# Patient Record
Sex: Female | Born: 1940
Health system: Southern US, Community
[De-identification: ages and names within clinical notes are randomized; demographics above are authoritative.]

## PROBLEM LIST (undated history)

## (undated) DIAGNOSIS — E7800 Pure hypercholesterolemia, unspecified: Secondary | ICD-10-CM

## (undated) DIAGNOSIS — G629 Polyneuropathy, unspecified: Secondary | ICD-10-CM

## (undated) DIAGNOSIS — Z923 Personal history of irradiation: Secondary | ICD-10-CM

## (undated) DIAGNOSIS — C73 Malignant neoplasm of thyroid gland: Secondary | ICD-10-CM

## (undated) DIAGNOSIS — C50919 Malignant neoplasm of unspecified site of unspecified female breast: Secondary | ICD-10-CM

## (undated) DIAGNOSIS — I10 Essential (primary) hypertension: Secondary | ICD-10-CM

## (undated) DIAGNOSIS — C3490 Malignant neoplasm of unspecified part of unspecified bronchus or lung: Secondary | ICD-10-CM

## (undated) DIAGNOSIS — E039 Hypothyroidism, unspecified: Secondary | ICD-10-CM

## (undated) DIAGNOSIS — E079 Disorder of thyroid, unspecified: Secondary | ICD-10-CM

## (undated) DIAGNOSIS — Z9221 Personal history of antineoplastic chemotherapy: Secondary | ICD-10-CM

## (undated) DIAGNOSIS — E119 Type 2 diabetes mellitus without complications: Secondary | ICD-10-CM

## (undated) HISTORY — DX: Disorder of thyroid, unspecified: E07.9

## (undated) HISTORY — DX: Essential (primary) hypertension: I10

## (undated) HISTORY — PX: BREAST EXCISIONAL BIOPSY: SUR124

## (undated) HISTORY — PX: THYROIDECTOMY: SHX17

## (undated) HISTORY — DX: Pure hypercholesterolemia, unspecified: E78.00

## (undated) HISTORY — DX: Type 2 diabetes mellitus without complications: E11.9

---

## 1999-07-24 DIAGNOSIS — C50919 Malignant neoplasm of unspecified site of unspecified female breast: Secondary | ICD-10-CM

## 1999-07-24 HISTORY — DX: Malignant neoplasm of unspecified site of unspecified female breast: C50.919

## 1999-07-24 HISTORY — PX: BREAST LUMPECTOMY: SHX2

## 2011-08-15 DIAGNOSIS — K3189 Other diseases of stomach and duodenum: Secondary | ICD-10-CM | POA: Diagnosis not present

## 2011-08-15 DIAGNOSIS — R1013 Epigastric pain: Secondary | ICD-10-CM | POA: Diagnosis not present

## 2011-09-13 DIAGNOSIS — I10 Essential (primary) hypertension: Secondary | ICD-10-CM | POA: Diagnosis not present

## 2011-09-13 DIAGNOSIS — R42 Dizziness and giddiness: Secondary | ICD-10-CM | POA: Diagnosis not present

## 2011-10-02 DIAGNOSIS — N63 Unspecified lump in unspecified breast: Secondary | ICD-10-CM | POA: Diagnosis not present

## 2011-10-02 DIAGNOSIS — Z853 Personal history of malignant neoplasm of breast: Secondary | ICD-10-CM | POA: Diagnosis not present

## 2011-10-02 DIAGNOSIS — Z124 Encounter for screening for malignant neoplasm of cervix: Secondary | ICD-10-CM | POA: Diagnosis not present

## 2011-10-02 DIAGNOSIS — Z0000 Encounter for general adult medical examination without abnormal findings: Secondary | ICD-10-CM | POA: Diagnosis not present

## 2011-10-08 DIAGNOSIS — Z1211 Encounter for screening for malignant neoplasm of colon: Secondary | ICD-10-CM | POA: Diagnosis not present

## 2011-10-16 DIAGNOSIS — Z13820 Encounter for screening for osteoporosis: Secondary | ICD-10-CM | POA: Diagnosis not present

## 2011-10-16 DIAGNOSIS — Z853 Personal history of malignant neoplasm of breast: Secondary | ICD-10-CM | POA: Diagnosis not present

## 2011-10-16 DIAGNOSIS — N63 Unspecified lump in unspecified breast: Secondary | ICD-10-CM | POA: Diagnosis not present

## 2011-10-30 DIAGNOSIS — I10 Essential (primary) hypertension: Secondary | ICD-10-CM | POA: Diagnosis not present

## 2011-10-30 DIAGNOSIS — D4860 Neoplasm of uncertain behavior of unspecified breast: Secondary | ICD-10-CM | POA: Diagnosis not present

## 2011-10-30 DIAGNOSIS — Z01810 Encounter for preprocedural cardiovascular examination: Secondary | ICD-10-CM | POA: Diagnosis not present

## 2011-10-30 DIAGNOSIS — Z01818 Encounter for other preprocedural examination: Secondary | ICD-10-CM | POA: Diagnosis not present

## 2011-11-02 DIAGNOSIS — D4860 Neoplasm of uncertain behavior of unspecified breast: Secondary | ICD-10-CM | POA: Diagnosis not present

## 2011-11-02 DIAGNOSIS — N63 Unspecified lump in unspecified breast: Secondary | ICD-10-CM | POA: Diagnosis not present

## 2011-11-02 DIAGNOSIS — Z0189 Encounter for other specified special examinations: Secondary | ICD-10-CM | POA: Diagnosis not present

## 2011-11-02 DIAGNOSIS — N6019 Diffuse cystic mastopathy of unspecified breast: Secondary | ICD-10-CM | POA: Diagnosis not present

## 2011-11-02 DIAGNOSIS — D249 Benign neoplasm of unspecified breast: Secondary | ICD-10-CM | POA: Diagnosis not present

## 2011-11-20 DIAGNOSIS — C73 Malignant neoplasm of thyroid gland: Secondary | ICD-10-CM | POA: Diagnosis not present

## 2011-12-24 DIAGNOSIS — C73 Malignant neoplasm of thyroid gland: Secondary | ICD-10-CM | POA: Diagnosis not present

## 2011-12-24 DIAGNOSIS — E119 Type 2 diabetes mellitus without complications: Secondary | ICD-10-CM | POA: Diagnosis not present

## 2011-12-24 DIAGNOSIS — E039 Hypothyroidism, unspecified: Secondary | ICD-10-CM | POA: Diagnosis not present

## 2012-02-25 DIAGNOSIS — C73 Malignant neoplasm of thyroid gland: Secondary | ICD-10-CM | POA: Diagnosis not present

## 2012-03-18 DIAGNOSIS — C73 Malignant neoplasm of thyroid gland: Secondary | ICD-10-CM | POA: Diagnosis not present

## 2012-03-20 DIAGNOSIS — Z1211 Encounter for screening for malignant neoplasm of colon: Secondary | ICD-10-CM | POA: Diagnosis not present

## 2012-03-20 DIAGNOSIS — Z0189 Encounter for other specified special examinations: Secondary | ICD-10-CM | POA: Diagnosis not present

## 2012-03-20 DIAGNOSIS — D126 Benign neoplasm of colon, unspecified: Secondary | ICD-10-CM | POA: Diagnosis not present

## 2012-05-14 DIAGNOSIS — C73 Malignant neoplasm of thyroid gland: Secondary | ICD-10-CM | POA: Diagnosis not present

## 2012-07-03 DIAGNOSIS — E039 Hypothyroidism, unspecified: Secondary | ICD-10-CM | POA: Diagnosis not present

## 2012-07-03 DIAGNOSIS — E785 Hyperlipidemia, unspecified: Secondary | ICD-10-CM | POA: Diagnosis not present

## 2012-07-03 DIAGNOSIS — E119 Type 2 diabetes mellitus without complications: Secondary | ICD-10-CM | POA: Diagnosis not present

## 2012-09-08 DIAGNOSIS — J40 Bronchitis, not specified as acute or chronic: Secondary | ICD-10-CM | POA: Diagnosis not present

## 2012-09-15 DIAGNOSIS — R05 Cough: Secondary | ICD-10-CM | POA: Diagnosis not present

## 2012-10-14 DIAGNOSIS — R350 Frequency of micturition: Secondary | ICD-10-CM | POA: Diagnosis not present

## 2012-10-14 DIAGNOSIS — Z1211 Encounter for screening for malignant neoplasm of colon: Secondary | ICD-10-CM | POA: Diagnosis not present

## 2012-10-20 DIAGNOSIS — N950 Postmenopausal bleeding: Secondary | ICD-10-CM | POA: Diagnosis not present

## 2012-10-20 DIAGNOSIS — Z1231 Encounter for screening mammogram for malignant neoplasm of breast: Secondary | ICD-10-CM | POA: Diagnosis not present

## 2012-10-29 DIAGNOSIS — R350 Frequency of micturition: Secondary | ICD-10-CM | POA: Diagnosis not present

## 2012-10-29 DIAGNOSIS — J40 Bronchitis, not specified as acute or chronic: Secondary | ICD-10-CM | POA: Diagnosis not present

## 2012-10-29 DIAGNOSIS — R3915 Urgency of urination: Secondary | ICD-10-CM | POA: Diagnosis not present

## 2012-12-22 DIAGNOSIS — C73 Malignant neoplasm of thyroid gland: Secondary | ICD-10-CM | POA: Diagnosis not present

## 2013-01-27 DIAGNOSIS — R21 Rash and other nonspecific skin eruption: Secondary | ICD-10-CM | POA: Diagnosis not present

## 2013-05-23 DIAGNOSIS — R300 Dysuria: Secondary | ICD-10-CM | POA: Diagnosis not present

## 2013-05-25 DIAGNOSIS — R300 Dysuria: Secondary | ICD-10-CM | POA: Diagnosis not present

## 2013-08-08 DIAGNOSIS — R05 Cough: Secondary | ICD-10-CM | POA: Diagnosis not present

## 2013-08-08 DIAGNOSIS — R509 Fever, unspecified: Secondary | ICD-10-CM | POA: Diagnosis not present

## 2013-08-08 DIAGNOSIS — J40 Bronchitis, not specified as acute or chronic: Secondary | ICD-10-CM | POA: Diagnosis not present

## 2013-08-08 DIAGNOSIS — R062 Wheezing: Secondary | ICD-10-CM | POA: Diagnosis not present

## 2013-08-08 DIAGNOSIS — R059 Cough, unspecified: Secondary | ICD-10-CM | POA: Diagnosis not present

## 2013-08-12 DIAGNOSIS — R059 Cough, unspecified: Secondary | ICD-10-CM | POA: Diagnosis not present

## 2013-08-12 DIAGNOSIS — J40 Bronchitis, not specified as acute or chronic: Secondary | ICD-10-CM | POA: Diagnosis not present

## 2013-08-12 DIAGNOSIS — R05 Cough: Secondary | ICD-10-CM | POA: Diagnosis not present

## 2013-08-12 DIAGNOSIS — R062 Wheezing: Secondary | ICD-10-CM | POA: Diagnosis not present

## 2013-11-12 DIAGNOSIS — R42 Dizziness and giddiness: Secondary | ICD-10-CM | POA: Diagnosis not present

## 2013-11-29 ENCOUNTER — Encounter: Payer: Self-pay | Admitting: Cardiology

## 2013-11-29 DIAGNOSIS — E039 Hypothyroidism, unspecified: Secondary | ICD-10-CM | POA: Insufficient documentation

## 2013-11-29 DIAGNOSIS — I10 Essential (primary) hypertension: Secondary | ICD-10-CM | POA: Insufficient documentation

## 2013-11-29 DIAGNOSIS — R42 Dizziness and giddiness: Secondary | ICD-10-CM | POA: Insufficient documentation

## 2013-11-29 DIAGNOSIS — E1149 Type 2 diabetes mellitus with other diabetic neurological complication: Secondary | ICD-10-CM | POA: Insufficient documentation

## 2013-11-29 DIAGNOSIS — E785 Hyperlipidemia, unspecified: Secondary | ICD-10-CM | POA: Insufficient documentation

## 2013-11-29 DIAGNOSIS — I951 Orthostatic hypotension: Secondary | ICD-10-CM | POA: Insufficient documentation

## 2013-11-30 ENCOUNTER — Ambulatory Visit (INDEPENDENT_AMBULATORY_CARE_PROVIDER_SITE_OTHER): Payer: Medicare Other | Admitting: Cardiology

## 2013-11-30 ENCOUNTER — Other Ambulatory Visit: Payer: Self-pay

## 2013-11-30 ENCOUNTER — Encounter: Payer: Self-pay | Admitting: Cardiology

## 2013-11-30 VITALS — BP 112/73 | HR 59 | Ht 69.0 in | Wt 184.0 lb

## 2013-11-30 DIAGNOSIS — R42 Dizziness and giddiness: Secondary | ICD-10-CM | POA: Diagnosis not present

## 2013-11-30 DIAGNOSIS — Z85850 Personal history of malignant neoplasm of thyroid: Secondary | ICD-10-CM

## 2013-11-30 DIAGNOSIS — I951 Orthostatic hypotension: Secondary | ICD-10-CM | POA: Diagnosis not present

## 2013-11-30 DIAGNOSIS — E039 Hypothyroidism, unspecified: Secondary | ICD-10-CM

## 2013-11-30 DIAGNOSIS — E785 Hyperlipidemia, unspecified: Secondary | ICD-10-CM

## 2013-11-30 DIAGNOSIS — I10 Essential (primary) hypertension: Secondary | ICD-10-CM | POA: Diagnosis not present

## 2013-11-30 DIAGNOSIS — E119 Type 2 diabetes mellitus without complications: Secondary | ICD-10-CM

## 2013-11-30 DIAGNOSIS — Z853 Personal history of malignant neoplasm of breast: Secondary | ICD-10-CM

## 2013-11-30 MED ORDER — TRIAMCINOLONE ACETONIDE 0.1 % EX CREA: TOPICAL_CREAM | CUTANEOUS | Status: DC

## 2013-11-30 NOTE — Assessment & Plan Note (Signed)
She is on medications for her diabetes. We will send a hemoglobin A1c with her labs. All of these labs will be sent to her primary physician as soon as she is established.

## 2013-11-30 NOTE — Progress Notes (Signed)
Patient ID: Kim Singh, female   DOB: [birth year], 73 y.o.   MRN: [id number]    HPI  Patient is here to establish with cardiology. She is referred by [facility] in [place]. She is just moved here from [place].Marland Kitchen she had some dizziness. She did not have syncope or presyncope. She was assessed nicely at the urgent care Center. When her orthostatic blood pressure was first checked there was question that she had a blood pressure drop. She then had a second orthostatic check and her pressure was okay. She was referred for further evaluation to be sure that she's stable. She is going to establish with a new primary care physician but this appointment has not yet been made. She cannot tell me today who her new doctor will be. Since the episode, she has very slight dizziness very infrequently upon standing. She is not having any marked symptoms. She is fully active.  There is no documented cardiac abnormality historically. There is a lipid abnormality this treated. She has hypertension is treated. There is diabetes.  No Known Allergies  Current Outpatient Prescriptions  Medication Sig Dispense Refill  . aspirin 81 MG tablet Take 81 mg by mouth daily.      Marland Kitchen CALCIUM-VITAMIN D PO Take by mouth 2 (two) times daily.      . citalopram (CELEXA) 10 MG tablet Take 10 mg by mouth daily.      Marland Kitchen ezetimibe (ZETIA) 10 MG tablet Take 10 mg by mouth daily.      Marland Kitchen levothyroxine (SYNTHROID) 100 MCG tablet Take 100 mcg by mouth daily before breakfast.      . metFORMIN (GLUCOPHAGE) 1000 MG tablet Take 1,000 mg by mouth daily with breakfast.      . Multiple Vitamins-Minerals (CENTRUM SILVER PO) Take by mouth daily.      . Multiple Vitamins-Minerals (ICAPS MV PO) Take by mouth as directed.      . nadolol (CORGARD) 40 MG tablet Take 40 mg by mouth daily.      . Omega-3-acid Ethyl Esters (OMACOR PO) Take by mouth 2 (two) times daily.      . quinapril (ACCUPRIL) 10 MG tablet Take 10 mg by mouth daily.      .  rosuvastatin (CRESTOR) 10 MG tablet Take 10 mg by mouth daily.       No current facility-administered medications for this visit.    History   Social History  . Marital Status: Widowed    Spouse Name: N/A    Number of Children: N/A  . Years of Education: N/A   Occupational History  . Not on file.   Social History Main Topics  . Smoking status: Former Smoker    Quit date: 08/02/1997  . Smokeless tobacco: Not on file  . Alcohol Use: Yes  . Drug Use: No  . Sexual Activity: Not on file   Other Topics Concern  . Not on file   Social History Narrative  . No narrative on file    Family History  Problem Relation Age of Onset  . Diabetes Mother     Past Medical History  Diagnosis Date  . Diabetes mellitus type 2, uncomplicated   . Unspecified essential hypertension   . Pure hypercholesterolemia   . Unspecified disorder of thyroid     Past Surgical History  Procedure Laterality Date  . Mastectomy      Patient Active Problem List   Diagnosis Date Noted  . Orthostatic hypotension 11/29/2013  . Dizziness 11/29/2013  .  Dyslipidemia 11/29/2013  . Hypothyroidism 11/29/2013  . Diabetes 11/29/2013  . Essential hypertension 11/29/2013    ROS   Patient denies fever, chills, headache, sweats, rash, change in vision, change in hearing, chest pain, cough, nausea vomiting, urinary symptoms. All other systems are reviewed and are negative.  PHYSICAL EXAM  Patient is oriented to person time and place. Affect is normal. She appears quite stable. Head is atraumatic. Conjunctiva and sclera are normal. There are no carotid bruits. There is no jugulovenous distention. Lungs are clear. Respiratory effort is nonlabored. Cardiac exam reveals S1 and S2. There are no significant murmurs. The abdomen is soft. There is no peripheral edema. There are no musculoskeletal deformities. There are no skin rashes. Neurologic is grossly intact.  Filed Vitals:   11/30/13 1113  BP: 112/73  Pulse:  59  Height: 5\' 9"  (1.753 m)  Weight: 184 lb (83.462 kg)   EKG is done today and reviewed by me. She has low voltage. There is sinus rhythm. There are nonspecific ST-T wave changes. There is no EKG for comparison.  ASSESSMENT & PLAN

## 2013-11-30 NOTE — Assessment & Plan Note (Signed)
Her lipids are being treated by her prior physicians. We will check a fasting lipid profile.

## 2013-11-30 NOTE — Assessment & Plan Note (Addendum)
Blood pressure is nicely controlled on her current medications. No change in therapy.  The patient has moved to this area. She has no recent labs. We will obtain CBC, chemistry, fasting lipid, TSH, hemoglobin A1c. I will respond to any significant abnormalities. Otherwise her labs are be sent to her primary physician as soon as we can determine who that is.

## 2013-11-30 NOTE — Assessment & Plan Note (Signed)
The patient has very slight dizziness that is positional. She needs to be careful when standing up. She needs to remain adequately hydrated. No further workup.

## 2013-11-30 NOTE — Assessment & Plan Note (Signed)
She is on thyroid replacement after thyroid surgery. TSH will be checked.

## 2013-11-30 NOTE — Assessment & Plan Note (Signed)
At this point I feel we do not have a firm diagnosis of orthostatic hypotension. The blood pressure measurements varied at the urgent care Center. Today very careful orthostatic blood pressure does not show any marked change and there was no marked symptomatology. No further workup is needed.

## 2013-11-30 NOTE — Patient Instructions (Signed)
**Note De-Identified Safal Halderman Obfuscation** Your physician recommends that you continue on your current medications as directed. Please refer to the Current Medication list given to you today.  Your physician recommends that you return for lab work in: Wednesday 12/02/13. Do not eat or drink after midnight the night before labs are drawn  Your physician recommends that you schedule a follow-up appointment in: as needed

## 2013-12-02 ENCOUNTER — Ambulatory Visit: Payer: Self-pay | Admitting: Cardiology

## 2013-12-02 ENCOUNTER — Other Ambulatory Visit (INDEPENDENT_AMBULATORY_CARE_PROVIDER_SITE_OTHER): Payer: Medicare Other

## 2013-12-02 DIAGNOSIS — I951 Orthostatic hypotension: Secondary | ICD-10-CM

## 2013-12-02 DIAGNOSIS — E785 Hyperlipidemia, unspecified: Secondary | ICD-10-CM | POA: Diagnosis not present

## 2013-12-02 DIAGNOSIS — E039 Hypothyroidism, unspecified: Secondary | ICD-10-CM | POA: Diagnosis not present

## 2013-12-02 DIAGNOSIS — Z85850 Personal history of malignant neoplasm of thyroid: Secondary | ICD-10-CM

## 2013-12-02 DIAGNOSIS — E119 Type 2 diabetes mellitus without complications: Secondary | ICD-10-CM

## 2013-12-02 DIAGNOSIS — R42 Dizziness and giddiness: Secondary | ICD-10-CM

## 2013-12-02 LAB — CBC WITH DIFFERENTIAL/PLATELET
BASOS ABS: 0 10*3/uL (ref 0.0–0.1)
BASOS PCT: 0.4 % (ref 0.0–3.0)
Eosinophils Absolute: 0.3 10*3/uL (ref 0.0–0.7)
Eosinophils Relative: 4.6 % (ref 0.0–5.0)
HCT: 40.9 % (ref 36.0–46.0)
Hemoglobin: 13.6 g/dL (ref 12.0–15.0)
LYMPHS PCT: 23.2 % (ref 12.0–46.0)
Lymphs Abs: 1.4 10*3/uL (ref 0.7–4.0)
MCHC: 33.3 g/dL (ref 30.0–36.0)
MCV: 94.5 fl (ref 78.0–100.0)
Monocytes Absolute: 0.7 10*3/uL (ref 0.1–1.0)
Monocytes Relative: 12 % (ref 3.0–12.0)
NEUTROS PCT: 59.8 % (ref 43.0–77.0)
Neutro Abs: 3.7 10*3/uL (ref 1.4–7.7)
Platelets: 196 10*3/uL (ref 150.0–400.0)
RBC: 4.32 Mil/uL (ref 3.87–5.11)
RDW: 13.8 % (ref 11.5–15.5)
WBC: 6.2 10*3/uL (ref 4.0–10.5)

## 2013-12-02 LAB — COMPREHENSIVE METABOLIC PANEL
ALT: 29 U/L (ref 0–35)
AST: 31 U/L (ref 0–37)
Albumin: 3.7 g/dL (ref 3.5–5.2)
Alkaline Phosphatase: 48 U/L (ref 39–117)
BUN: 16 mg/dL (ref 6–23)
CO2: 32 mEq/L (ref 19–32)
Calcium: 9.2 mg/dL (ref 8.4–10.5)
Chloride: 104 mEq/L (ref 96–112)
Creatinine, Ser: 0.8 mg/dL (ref 0.4–1.2)
GFR: 70.24 mL/min (ref >60.00)
Glucose, Bld: 123 mg/dL — ABNORMAL HIGH (ref 70–99)
Potassium: 4.8 mEq/L (ref 3.5–5.1)
Sodium: 142 mEq/L (ref 135–145)
Total Bilirubin: 1.1 mg/dL (ref 0.2–1.2)
Total Protein: 6.5 g/dL (ref 6.0–8.3)

## 2013-12-02 LAB — LIPID PANEL
Cholesterol: 104 mg/dL (ref 0–200)
HDL: 38.3 mg/dL — ABNORMAL LOW (ref >39.00)
LDL Cholesterol: 45 mg/dL (ref 0–99)
Total CHOL/HDL Ratio: 3
Triglycerides: 103 mg/dL (ref 0.0–149.0)
VLDL: 20.6 mg/dL (ref 0.0–40.0)

## 2013-12-02 LAB — HEMOGLOBIN A1C: Hgb A1c MFr Bld: 6.7 % — ABNORMAL HIGH (ref 4.6–6.5)

## 2013-12-02 LAB — TSH: TSH: 0.48 u[IU]/mL (ref 0.35–4.50)

## 2013-12-23 ENCOUNTER — Telehealth: Payer: Self-pay | Admitting: Internal Medicine

## 2013-12-23 NOTE — Telephone Encounter (Signed)
Pt would like to est with dr Regis Bill. Pt has medicare

## 2013-12-24 NOTE — Telephone Encounter (Signed)
Pt decided to go with [facility]onda

## 2013-12-25 ENCOUNTER — Encounter: Payer: Self-pay | Admitting: Family

## 2013-12-25 ENCOUNTER — Ambulatory Visit (INDEPENDENT_AMBULATORY_CARE_PROVIDER_SITE_OTHER): Payer: Medicare Other | Admitting: Family

## 2013-12-25 VITALS — BP 118/74 | HR 76 | Temp 99.2°F | Ht 69.0 in | Wt 186.0 lb

## 2013-12-25 DIAGNOSIS — E7800 Pure hypercholesterolemia, unspecified: Secondary | ICD-10-CM

## 2013-12-25 DIAGNOSIS — E039 Hypothyroidism, unspecified: Secondary | ICD-10-CM

## 2013-12-25 DIAGNOSIS — I798 Other disorders of arteries, arterioles and capillaries in diseases classified elsewhere: Secondary | ICD-10-CM | POA: Diagnosis not present

## 2013-12-25 DIAGNOSIS — E1159 Type 2 diabetes mellitus with other circulatory complications: Secondary | ICD-10-CM

## 2013-12-25 DIAGNOSIS — E1151 Type 2 diabetes mellitus with diabetic peripheral angiopathy without gangrene: Secondary | ICD-10-CM

## 2013-12-25 DIAGNOSIS — I10 Essential (primary) hypertension: Secondary | ICD-10-CM | POA: Diagnosis not present

## 2013-12-25 DIAGNOSIS — Z1231 Encounter for screening mammogram for malignant neoplasm of breast: Secondary | ICD-10-CM

## 2013-12-25 NOTE — Patient Instructions (Signed)

## 2013-12-25 NOTE — Progress Notes (Signed)
Subjective:    Patient ID: Kim Singh, female    DOB: 1941/05/12, 73 y.o.   MRN: [id number]  HPI 73 year old white female, nonsmoker relocating here from [place] and then to be established. She has a history of type 2 diabetes, hypothyroidism, hypercholesterolemia, hypertension, history of breast cancer, and history of thyroid cancer. Currently stable on medications without any concerns. Does not routinely exercise. However, she stays active in senior groups.   Review of Systems  Constitutional: Negative.   HENT: Negative.   Respiratory: Negative.   Cardiovascular: Negative.   Gastrointestinal: Negative.   Endocrine: Negative.  Negative for polydipsia and polyphagia.  Genitourinary: Negative.   Musculoskeletal: Negative.   Skin: Negative.   Allergic/Immunologic: Negative.   Neurological: Negative.   Hematological: Negative.   Psychiatric/Behavioral: Negative.    Past Medical History  Diagnosis Date  . Diabetes mellitus type 2, uncomplicated   . Unspecified essential hypertension   . Pure hypercholesterolemia   . Unspecified disorder of thyroid     History   Social History  . Marital Status: Widowed    Spouse Name: N/A    Number of Children: N/A  . Years of Education: N/A   Occupational History  . Not on file.   Social History Main Topics  . Smoking status: Former Smoker    Quit date: 08/02/1997  . Smokeless tobacco: Not on file  . Alcohol Use: Yes  . Drug Use: No  . Sexual Activity: Not on file   Other Topics Concern  . Not on file   Social History Narrative  . No narrative on file    Past Surgical History  Procedure Laterality Date  . Mastectomy      Family History  Problem Relation Age of Onset  . Diabetes Mother     No Known Allergies  Current Outpatient Prescriptions on File Prior to Visit  Medication Sig Dispense Refill  . aspirin 81 MG tablet Take 81 mg by mouth daily.      Marland Kitchen CALCIUM-VITAMIN D PO Take by mouth 2 (two) times daily.       . citalopram (CELEXA) 10 MG tablet Take 10 mg by mouth daily.      Marland Kitchen ezetimibe (ZETIA) 10 MG tablet Take 10 mg by mouth daily.      Marland Kitchen levothyroxine (SYNTHROID) 100 MCG tablet Take 100 mcg by mouth daily before breakfast.      . metFORMIN (GLUCOPHAGE) 1000 MG tablet Take 1,000 mg by mouth daily with breakfast.      . Multiple Vitamins-Minerals (CENTRUM SILVER PO) Take by mouth daily.      . Multiple Vitamins-Minerals (ICAPS MV PO) Take by mouth as directed.      . nadolol (CORGARD) 40 MG tablet Take 40 mg by mouth daily.      . Omega-3-acid Ethyl Esters (OMACOR PO) Take by mouth 2 (two) times daily.      . quinapril (ACCUPRIL) 10 MG tablet Take 10 mg by mouth daily.      . rosuvastatin (CRESTOR) 10 MG tablet Take 10 mg by mouth daily.      Marland Kitchen triamcinolone cream (KENALOG) 0.1 % Apply and rub in a thin film to affected area twice a day  80 g  0   No current facility-administered medications on file prior to visit.    BP 118/74  Pulse 76  Temp(Src) 99.2 F (37.3 C) (Oral)  Ht 5\' 9"  (1.753 m)  Wt 186 lb (84.369 kg)  BMI 27.45 kg/m2  SpO2 93%chart  Objective:   Physical Exam  Constitutional: She is oriented to person, place, and time. She appears well-developed and well-nourished.  HENT:  Right Ear: External ear normal.  Left Ear: External ear normal.  Nose: Nose normal.  Mouth/Throat: Oropharynx is clear and moist.  Neck: Normal range of motion. Neck supple. No thyromegaly present.  Cardiovascular: Normal rate, regular rhythm and normal heart sounds.   Pulmonary/Chest: Effort normal and breath sounds normal.  Abdominal: Soft. Bowel sounds are normal.  Musculoskeletal: Normal range of motion.  Neurological: She is alert and oriented to person, place, and time.  Skin: Skin is warm and dry.  Psychiatric: She has a normal mood and affect.          Assessment & Plan:   Problem List Items Addressed This Visit   None    Visit Diagnoses   Diabetes mellitus with  peripheral vascular disease    -  Primary    Unspecified hypothyroidism        Unspecified essential hypertension        Pure hypercholesterolemia        Screening mammogram for high-risk patient        Relevant Orders       MM Digital Screening      encourage healthy diet, exercise, monthly self breast exams. Followup in 6 months and sooner if needed.

## 2013-12-25 NOTE — Progress Notes (Signed)
Pre visit review using our clinic review tool, if applicable. No additional management support is needed unless otherwise documented below in the visit note. 

## 2013-12-28 ENCOUNTER — Telehealth: Payer: Self-pay | Admitting: Family

## 2013-12-28 NOTE — Telephone Encounter (Signed)
Relevant patient education mailed to patient.  

## 2014-01-18 DIAGNOSIS — H25049 Posterior subcapsular polar age-related cataract, unspecified eye: Secondary | ICD-10-CM | POA: Diagnosis not present

## 2014-01-18 DIAGNOSIS — H2510 Age-related nuclear cataract, unspecified eye: Secondary | ICD-10-CM | POA: Diagnosis not present

## 2014-01-18 DIAGNOSIS — E119 Type 2 diabetes mellitus without complications: Secondary | ICD-10-CM | POA: Diagnosis not present

## 2014-01-18 DIAGNOSIS — H25019 Cortical age-related cataract, unspecified eye: Secondary | ICD-10-CM | POA: Diagnosis not present

## 2014-02-01 ENCOUNTER — Encounter (INDEPENDENT_AMBULATORY_CARE_PROVIDER_SITE_OTHER): Payer: Self-pay

## 2014-02-01 ENCOUNTER — Ambulatory Visit
Admission: RE | Admit: 2014-02-01 | Discharge: 2014-02-01 | Disposition: A | Discharge status: Home / Self Care | Payer: Medicare Other | Source: Ambulatory Visit | Attending: Family | Admitting: Family

## 2014-02-01 DIAGNOSIS — Z1231 Encounter for screening mammogram for malignant neoplasm of breast: Secondary | ICD-10-CM

## 2014-02-05 ENCOUNTER — Other Ambulatory Visit: Payer: Self-pay | Admitting: Family

## 2014-02-05 DIAGNOSIS — R928 Other abnormal and inconclusive findings on diagnostic imaging of breast: Secondary | ICD-10-CM

## 2014-02-12 ENCOUNTER — Ambulatory Visit
Admission: RE | Admit: 2014-02-12 | Discharge: 2014-02-12 | Disposition: A | Discharge status: Home / Self Care | Payer: Medicare Other | Source: Ambulatory Visit | Attending: Family | Admitting: Family

## 2014-02-12 DIAGNOSIS — R928 Other abnormal and inconclusive findings on diagnostic imaging of breast: Secondary | ICD-10-CM

## 2014-02-12 DIAGNOSIS — N6459 Other signs and symptoms in breast: Secondary | ICD-10-CM | POA: Diagnosis not present

## 2014-02-12 DIAGNOSIS — Z853 Personal history of malignant neoplasm of breast: Secondary | ICD-10-CM | POA: Diagnosis not present

## 2014-04-12 ENCOUNTER — Telehealth: Payer: Self-pay | Admitting: Family

## 2014-04-12 NOTE — Telephone Encounter (Signed)
Pt will pick application up 1/60/73

## 2014-04-12 NOTE — Telephone Encounter (Signed)
Pt states she lost handicap placard application that was previously completed.  Pt requesting to have another one filled out.  Please call when ready for pick up.

## 2014-05-26 ENCOUNTER — Ambulatory Visit (INDEPENDENT_AMBULATORY_CARE_PROVIDER_SITE_OTHER): Payer: Medicare Other | Admitting: Family Medicine

## 2014-05-26 ENCOUNTER — Encounter: Payer: Self-pay | Admitting: Family Medicine

## 2014-05-26 VITALS — BP 122/82 | HR 80 | Temp 99.0°F | Ht 69.0 in | Wt 190.0 lb

## 2014-05-26 DIAGNOSIS — J209 Acute bronchitis, unspecified: Secondary | ICD-10-CM

## 2014-05-26 MED ORDER — AMOXICILLIN-POT CLAVULANATE 875-125 MG PO TABS: 1.0000 | ORAL_TABLET | Freq: Two times a day (BID) | ORAL | Status: DC

## 2014-05-26 NOTE — Progress Notes (Signed)
Pre visit review using our clinic review tool, if applicable. No additional management support is needed unless otherwise documented below in the visit note. 

## 2014-05-26 NOTE — Progress Notes (Signed)
   Subjective:    Patient ID: Kim Singh, female    DOB: [birth year], 73 y.o.   MRN: [id number]  HPI Here for one week of PND, ST, chest congestion and a dry cough. No fever.    Review of Systems  Constitutional: Negative.   HENT: Positive for congestion and postnasal drip. Negative for sinus pressure.   Eyes: Negative.   Respiratory: Positive for cough. Negative for wheezing.        Objective:   Physical Exam  Constitutional: She appears well-developed and well-nourished.  HENT:  Right Ear: External ear normal.  Left Ear: External ear normal.  Nose: Nose normal.  Mouth/Throat: Oropharynx is clear and moist.  Eyes: Conjunctivae are normal.  Pulmonary/Chest: Effort normal. No respiratory distress. She has no wheezes. She has no rales.  Scattered rhonchi  Lymphadenopathy:    She has no cervical adenopathy.          Assessment & Plan:  Add Mucinex

## 2014-05-28 DIAGNOSIS — H02839 Dermatochalasis of unspecified eye, unspecified eyelid: Secondary | ICD-10-CM | POA: Diagnosis not present

## 2014-05-28 DIAGNOSIS — H2511 Age-related nuclear cataract, right eye: Secondary | ICD-10-CM | POA: Diagnosis not present

## 2014-05-28 DIAGNOSIS — H2512 Age-related nuclear cataract, left eye: Secondary | ICD-10-CM | POA: Diagnosis not present

## 2014-05-28 DIAGNOSIS — H18411 Arcus senilis, right eye: Secondary | ICD-10-CM | POA: Diagnosis not present

## 2014-06-01 ENCOUNTER — Telehealth: Payer: Self-pay | Admitting: Family

## 2014-06-01 MED ORDER — BENZONATATE 200 MG PO CAPS: 200.0000 mg | ORAL_CAPSULE | Freq: Two times a day (BID) | ORAL | Status: DC | PRN

## 2014-06-01 NOTE — Telephone Encounter (Signed)
Call in Benzonatate 200 mg to take bid prn cough, #60 with no rf

## 2014-06-01 NOTE — Telephone Encounter (Signed)
I sent script e-scribe and spoke with pt. 

## 2014-06-01 NOTE — Telephone Encounter (Signed)
Pt saw dr Sarajane Jews on Nov 4.  Pt states she has a continuous cough , it feels like it might be "breaking up". Pt states she is coughing a lot though. Would like to know if there is something else she can take.  Took otc cough med, no relief  [facility] [place]

## 2014-06-28 ENCOUNTER — Encounter: Payer: Self-pay | Admitting: Family

## 2014-06-28 ENCOUNTER — Ambulatory Visit (INDEPENDENT_AMBULATORY_CARE_PROVIDER_SITE_OTHER): Payer: Medicare Other | Admitting: Family

## 2014-06-28 ENCOUNTER — Ambulatory Visit (INDEPENDENT_AMBULATORY_CARE_PROVIDER_SITE_OTHER): Payer: Medicare Other

## 2014-06-28 VITALS — BP 120/80 | HR 67 | Ht 69.0 in | Wt 188.3 lb

## 2014-06-28 DIAGNOSIS — E7800 Pure hypercholesterolemia, unspecified: Secondary | ICD-10-CM

## 2014-06-28 DIAGNOSIS — I10 Essential (primary) hypertension: Secondary | ICD-10-CM

## 2014-06-28 DIAGNOSIS — Z23 Encounter for immunization: Secondary | ICD-10-CM

## 2014-06-28 DIAGNOSIS — E039 Hypothyroidism, unspecified: Secondary | ICD-10-CM

## 2014-06-28 DIAGNOSIS — E119 Type 2 diabetes mellitus without complications: Secondary | ICD-10-CM | POA: Diagnosis not present

## 2014-06-28 LAB — HEMOGLOBIN A1C: Hgb A1c MFr Bld: 6.8 % — ABNORMAL HIGH (ref 4.6–6.5)

## 2014-06-28 LAB — CBC WITH DIFFERENTIAL/PLATELET
Basophils Absolute: 0.1 10*3/uL (ref 0.0–0.1)
Basophils Relative: 1.1 % (ref 0.0–3.0)
EOS ABS: 0.2 10*3/uL (ref 0.0–0.7)
EOS PCT: 4.3 % (ref 0.0–5.0)
HCT: 41.4 % (ref 36.0–46.0)
Hemoglobin: 13.6 g/dL (ref 12.0–15.0)
Lymphocytes Relative: 22.3 % (ref 12.0–46.0)
Lymphs Abs: 1.3 10*3/uL (ref 0.7–4.0)
MCHC: 32.9 g/dL (ref 30.0–36.0)
MCV: 94.1 fl (ref 78.0–100.0)
Monocytes Absolute: 0.7 10*3/uL (ref 0.1–1.0)
Monocytes Relative: 11.7 % (ref 3.0–12.0)
NEUTROS PCT: 60.6 % (ref 43.0–77.0)
Neutro Abs: 3.5 10*3/uL (ref 1.4–7.7)
Platelets: 209 10*3/uL (ref 150.0–400.0)
RBC: 4.4 Mil/uL (ref 3.87–5.11)
RDW: 13.7 % (ref 11.5–15.5)
WBC: 5.7 10*3/uL (ref 4.0–10.5)

## 2014-06-28 LAB — TSH: TSH: 3.6 u[IU]/mL (ref 0.35–4.50)

## 2014-06-28 LAB — BASIC METABOLIC PANEL
BUN: 14 mg/dL (ref 6–23)
CO2: 26 mEq/L (ref 19–32)
CREATININE: 0.8 mg/dL (ref 0.4–1.2)
Calcium: 9 mg/dL (ref 8.4–10.5)
Chloride: 103 mEq/L (ref 96–112)
GFR: 72.11 mL/min (ref >60.00)
GLUCOSE: 112 mg/dL — AB (ref 70–99)
Potassium: 3.7 mEq/L (ref 3.5–5.1)
Sodium: 138 mEq/L (ref 135–145)

## 2014-06-28 LAB — MICROALBUMIN / CREATININE URINE RATIO
CREATININE, U: 129 mg/dL
MICROALB UR: 1.5 mg/dL (ref 0.0–1.9)
Microalb Creat Ratio: 1.2 mg/g (ref 0.0–30.0)

## 2014-06-28 LAB — LIPID PANEL
CHOL/HDL RATIO: 3
Cholesterol: 111 mg/dL (ref 0–200)
HDL: 41.5 mg/dL (ref >39.00)
LDL Cholesterol: 38 mg/dL (ref 0–99)
NONHDL: 69.5
TRIGLYCERIDES: 160 mg/dL — AB (ref 0.0–149.0)
VLDL: 32 mg/dL (ref 0.0–40.0)

## 2014-06-28 NOTE — Progress Notes (Signed)
Subjective:    Patient ID: Kim Singh, female    DOB: 1940-11-12, 73 y.o.   MRN: [id number]  HPI  73 year old white female, former smoker with a history of hypertension type 2 diabetes, hypothyroidism, hypercholesterolemia, history of breast cancer, history of thyroid cancer is in today for recheck. Reports she's been doing well. Is scheduled to have cataract surgery on Monday. Does not routinely exercise or check her blood glucose.  Review of Systems  Constitutional: Negative.   HENT: Negative.   Eyes: Negative.   Respiratory: Negative.   Cardiovascular: Negative.   Gastrointestinal: Negative.   Endocrine: Negative.   Genitourinary: Negative.   Musculoskeletal: Negative.   Skin: Negative.   Allergic/Immunologic: Negative.   Neurological: Positive for dizziness.  Hematological: Negative.   Psychiatric/Behavioral: Negative.    Past Medical History  Diagnosis Date  . Diabetes mellitus type 2, uncomplicated   . Unspecified essential hypertension   . Pure hypercholesterolemia   . Unspecified disorder of thyroid     History   Social History  . Marital Status: Widowed    Spouse Name: N/A    Number of Children: N/A  . Years of Education: N/A   Occupational History  . Not on file.   Social History Main Topics  . Smoking status: Former Smoker    Quit date: 08/02/1997  . Smokeless tobacco: Never Used  . Alcohol Use: 0.0 oz/week    0 Not specified per week     Comment: occ  . Drug Use: No  . Sexual Activity: Not on file   Other Topics Concern  . Not on file   Social History Narrative    Past Surgical History  Procedure Laterality Date  . Mastectomy      Family History  Problem Relation Age of Onset  . Diabetes Mother     No Known Allergies  Current Outpatient Prescriptions on File Prior to Visit  Medication Sig Dispense Refill  . aspirin 81 MG tablet Take 81 mg by mouth daily.    . benzonatate (TESSALON) 200 MG capsule Take 1 capsule (200 mg total)  by mouth 2 (two) times daily as needed for cough. 60 capsule 0  . CALCIUM-VITAMIN D PO Take by mouth 2 (two) times daily.    . citalopram (CELEXA) 10 MG tablet Take 10 mg by mouth daily.    Marland Kitchen ezetimibe (ZETIA) 10 MG tablet Take 10 mg by mouth daily.    Marland Kitchen levothyroxine (SYNTHROID) 100 MCG tablet Take 100 mcg by mouth daily before breakfast.    . metFORMIN (GLUCOPHAGE) 1000 MG tablet Take 1,000 mg by mouth daily with breakfast.    . Multiple Vitamins-Minerals (CENTRUM SILVER PO) Take by mouth daily.    . Omega-3-acid Ethyl Esters (OMACOR PO) Take by mouth 2 (two) times daily.    . quinapril (ACCUPRIL) 10 MG tablet Take 10 mg by mouth daily.    . rosuvastatin (CRESTOR) 10 MG tablet Take 10 mg by mouth daily.    Marland Kitchen triamcinolone cream (KENALOG) 0.1 % Apply and rub in a thin film to affected area twice a day 80 g 0   No current facility-administered medications on file prior to visit.    BP 120/80 mmHg  Pulse 67  Ht 5\' 9"  (1.753 m)  Wt 188 lb 4.8 oz (85.412 kg)  BMI 27.79 kg/m2chart    Objective:   Physical Exam  Constitutional: She is oriented to person, place, and time. She appears well-developed and well-nourished.  HENT:  Right Ear: External  ear normal.  Left Ear: External ear normal.  Nose: Nose normal.  Mouth/Throat: Oropharynx is clear and moist.  Neck: Normal range of motion. Neck supple.  Cardiovascular: Normal rate, regular rhythm and normal heart sounds.   Pulmonary/Chest: Effort normal and breath sounds normal.  Abdominal: Soft. Bowel sounds are normal.  Neurological: She is alert and oriented to person, place, and time.  Skin: Skin is warm and dry.  Psychiatric: She has a normal mood and affect.          Assessment & Plan:  Kim Singh was seen today for follow-up.  Diagnoses and associated orders for this visit:  Hypothyroidism, unspecified hypothyroidism type - Hemoglobin A1c - Lipid Panel - TSH - Basic Metabolic Panel - CBC with Differential  Type 2  diabetes mellitus without complication - Hemoglobin A1c - Lipid Panel - TSH - Basic Metabolic Panel - CBC with Differential - Microalbumin/Creatinine Ratio, Urine  Essential hypertension - Hemoglobin A1c - Lipid Panel - TSH - Basic Metabolic Panel - CBC with Differential  Pure hypercholesterolemia - Hemoglobin A1c - Lipid Panel - TSH - Basic Metabolic Panel - CBC with Differential  Need for prophylactic vaccination against Streptococcus pneumoniae (pneumococcus) - Pneumococcal conjugate vaccine 13-valent    Encouraged healthy diet, exercise, monthly self breast exams. Labs obtained today will follow-up pending results. Return for complete physical exam with fasting labs in 6 months and sooner as needed.

## 2014-06-28 NOTE — Patient Instructions (Signed)
Diabetes and Exercise Exercising regularly is important. It is not just about losing weight. It has many health benefits, such as:  Improving your overall fitness, flexibility, and endurance.  Increasing your bone density.  Helping with weight control.  Decreasing your body fat.  Increasing your muscle strength.  Reducing stress and tension.  Improving your overall health. People with diabetes who exercise gain additional benefits because exercise:  Reduces appetite.  Improves the body's use of blood sugar (glucose).  Helps lower or control blood glucose.  Decreases blood pressure.  Helps control blood lipids (such as cholesterol and triglycerides).  Improves the body's use of the hormone insulin by:  Increasing the body's insulin sensitivity.  Reducing the body's insulin needs.  Decreases the risk for heart disease because exercising:  Lowers cholesterol and triglycerides levels.  Increases the levels of good cholesterol (such as high-density lipoproteins [HDL]) in the body.  Lowers blood glucose levels. YOUR ACTIVITY PLAN  Choose an activity that you enjoy and set realistic goals. Your health care provider or diabetes educator can help you make an activity plan that works for you. Exercise regularly as directed by your health care provider. This includes:  Performing resistance training twice a week such as push-ups, sit-ups, lifting weights, or using resistance bands.  Performing 150 minutes of cardio exercises each week such as walking, running, or playing sports.  Staying active and spending no more than 90 minutes at one time being inactive. Even short bursts of exercise are good for you. Three 10-minute sessions spread throughout the day are just as beneficial as a single 30-minute session. Some exercise ideas include:  Taking the dog for a walk.  Taking the stairs instead of the elevator.  Dancing to your favorite song.  Doing an exercise  video.  Doing your favorite exercise with a friend. RECOMMENDATIONS FOR EXERCISING WITH TYPE 1 OR TYPE 2 DIABETES   Check your blood glucose before exercising. If blood glucose levels are greater than 240 mg/dL, check for urine ketones. Do not exercise if ketones are present.  Avoid injecting insulin into areas of the body that are going to be exercised. For example, avoid injecting insulin into:  The arms when playing tennis.  The legs when jogging.  Keep a record of:  Food intake before and after you exercise.  Expected peak times of insulin action.  Blood glucose levels before and after you exercise.  The type and amount of exercise you have done.  Review your records with your health care provider. Your health care provider will help you to develop guidelines for adjusting food intake and insulin amounts before and after exercising.  If you take insulin or oral hypoglycemic agents, watch for signs and symptoms of hypoglycemia. They include:  Dizziness.  Shaking.  Sweating.  Chills.  Confusion.  Drink plenty of water while you exercise to prevent dehydration or heat stroke. Body water is lost during exercise and must be replaced.  Talk to your health care provider before starting an exercise program to make sure it is safe for you. Remember, almost any type of activity is better than none. Document Released: 09/29/2003 Document Revised: 11/23/2013 Document Reviewed: 12/16/2012 [employer] Patient Information 2015 [employer]. This information is not intended to replace advice given to you by your health care provider. Make sure you discuss any questions you have with your health care provider.  

## 2014-06-28 NOTE — Progress Notes (Signed)
Pre visit review using our clinic review tool, if applicable. No additional management support is needed unless otherwise documented below in the visit note. 

## 2014-06-29 ENCOUNTER — Telehealth: Payer: Self-pay | Admitting: Family

## 2014-06-29 NOTE — Telephone Encounter (Signed)
emmi mailed  °

## 2014-07-05 DIAGNOSIS — H25812 Combined forms of age-related cataract, left eye: Secondary | ICD-10-CM | POA: Diagnosis not present

## 2014-07-05 DIAGNOSIS — H2512 Age-related nuclear cataract, left eye: Secondary | ICD-10-CM | POA: Diagnosis not present

## 2014-07-05 DIAGNOSIS — H25013 Cortical age-related cataract, bilateral: Secondary | ICD-10-CM | POA: Diagnosis not present

## 2014-07-05 DIAGNOSIS — H2513 Age-related nuclear cataract, bilateral: Secondary | ICD-10-CM | POA: Diagnosis not present

## 2014-07-05 DIAGNOSIS — H25043 Posterior subcapsular polar age-related cataract, bilateral: Secondary | ICD-10-CM | POA: Diagnosis not present

## 2014-07-06 DIAGNOSIS — H2511 Age-related nuclear cataract, right eye: Secondary | ICD-10-CM | POA: Diagnosis not present

## 2014-07-19 ENCOUNTER — Encounter: Payer: Self-pay | Admitting: Family Medicine

## 2014-07-19 ENCOUNTER — Ambulatory Visit (INDEPENDENT_AMBULATORY_CARE_PROVIDER_SITE_OTHER): Payer: Medicare Other | Admitting: Family Medicine

## 2014-07-19 VITALS — BP 118/70 | HR 71 | Temp 97.3°F | Ht 69.0 in | Wt 188.9 lb

## 2014-07-19 DIAGNOSIS — R21 Rash and other nonspecific skin eruption: Secondary | ICD-10-CM

## 2014-07-19 NOTE — Patient Instructions (Signed)
-  clotrimazole 1% cream twice daily  -follow up if persists

## 2014-07-19 NOTE — Progress Notes (Signed)
  HPI:  Acute visit for:  Rash: -under R breast - started yesterday -a little burning sensation -denies: breast pain, lumps, pain, fevers, breast drainage or nipple discharge  ROS: See pertinent positives and negatives per HPI.  Past Medical History  Diagnosis Date  . Diabetes mellitus type 2, uncomplicated   . Unspecified essential hypertension   . Pure hypercholesterolemia   . Unspecified disorder of thyroid     Past Surgical History  Procedure Laterality Date  . Mastectomy      Family History  Problem Relation Age of Onset  . Diabetes Mother     History   Social History  . Marital Status: Widowed    Spouse Name: N/A    Number of Children: N/A  . Years of Education: N/A   Social History Main Topics  . Smoking status: Former Smoker    Quit date: 08/02/1997  . Smokeless tobacco: Never Used  . Alcohol Use: 0.0 oz/week    0 Not specified per week     Comment: occ  . Drug Use: No  . Sexual Activity: None   Other Topics Concern  . None   Social History Narrative    Current outpatient prescriptions: aspirin 81 MG tablet, Take 81 mg by mouth daily., Disp: , Rfl: ;  CALCIUM-VITAMIN D PO, Take by mouth 2 (two) times daily., Disp: , Rfl: ;  citalopram (CELEXA) 10 MG tablet, Take 10 mg by mouth daily., Disp: , Rfl: ;  ezetimibe (ZETIA) 10 MG tablet, Take 10 mg by mouth daily., Disp: , Rfl: ;  levothyroxine (SYNTHROID) 100 MCG tablet, Take 100 mcg by mouth daily before breakfast., Disp: , Rfl:  metFORMIN (GLUCOPHAGE) 1000 MG tablet, Take 1,000 mg by mouth daily with breakfast., Disp: , Rfl: ;  Multiple Vitamins-Minerals (CENTRUM SILVER PO), Take by mouth daily., Disp: , Rfl: ;  Omega-3-acid Ethyl Esters (OMACOR PO), Take by mouth 2 (two) times daily., Disp: , Rfl: ;  quinapril (ACCUPRIL) 10 MG tablet, Take 10 mg by mouth daily., Disp: , Rfl: ;  rosuvastatin (CRESTOR) 10 MG tablet, Take 10 mg by mouth daily., Disp: , Rfl:  triamcinolone cream (KENALOG) 0.1 %, Apply and rub  in a thin film to affected area twice a day, Disp: 80 g, Rfl: 0;  VIGAMOX 0.5 % ophthalmic solution, , Disp: , Rfl: 0  EXAM:  Filed Vitals:   07/19/14 1610  BP: 118/70  Pulse: 71  Temp: 97.3 F (36.3 C)    Body mass index is 27.88 kg/(m^2).  GENERAL: vitals reviewed and listed above, alert, oriented, appears well hydrated and in no acute distress  HEENT: atraumatic, conjunttiva clear, no obvious abnormalities on inspection of external nose and ears  SKIN: patch or erythematous skin R inframammary fold  CV: HRRR, no peripheral edema  MS: moves all extremities without noticeable abnormality  PSYCH: pleasant and cooperative, no obvious depression or anxiety  ASSESSMENT AND PLAN:  Discussed the following assessment and plan:  Rash and nonspecific skin eruption  -intertrigo likely, tx topical azole, return if persists in 2-3 weeks or other concerns -Patient advised to return or notify a doctor immediately if symptoms worsen or persist or new concerns arise.  Patient Instructions  -clotrimazole 1% cream twice daily  -follow up if persists     Azael Ragain R.

## 2014-07-19 NOTE — Progress Notes (Signed)
Pre visit review using our clinic review tool, if applicable. No additional management support is needed unless otherwise documented below in the visit note. 

## 2014-07-26 DIAGNOSIS — H2513 Age-related nuclear cataract, bilateral: Secondary | ICD-10-CM | POA: Diagnosis not present

## 2014-07-26 DIAGNOSIS — H25043 Posterior subcapsular polar age-related cataract, bilateral: Secondary | ICD-10-CM | POA: Diagnosis not present

## 2014-07-26 DIAGNOSIS — H2511 Age-related nuclear cataract, right eye: Secondary | ICD-10-CM | POA: Diagnosis not present

## 2014-07-26 DIAGNOSIS — H25013 Cortical age-related cataract, bilateral: Secondary | ICD-10-CM | POA: Diagnosis not present

## 2014-07-26 DIAGNOSIS — H25811 Combined forms of age-related cataract, right eye: Secondary | ICD-10-CM | POA: Diagnosis not present

## 2014-08-27 ENCOUNTER — Telehealth: Payer: Self-pay

## 2014-08-27 NOTE — Telephone Encounter (Signed)
Please schedule pt a 6 month f/u for diabetes on Dr. Yong Channel schedule per Abby Potash.

## 2014-10-08 ENCOUNTER — Telehealth: Payer: Self-pay | Admitting: Family

## 2014-10-08 NOTE — Telephone Encounter (Signed)
Patient requesting re-fills on levothyroxine (SYNTHROID) 100 MCG tablet and rosuvastatin (CRESTOR) 10 MG tablet sent to [place], [place].  She said [facility] is suppose to send a request for more medications but she isn't sure of all the names.  [facility] was sending re-fill requests to her old provider in error.

## 2014-10-11 ENCOUNTER — Other Ambulatory Visit: Payer: Self-pay

## 2014-10-11 MED ORDER — ROSUVASTATIN CALCIUM 10 MG PO TABS: 10.0000 mg | ORAL_TABLET | Freq: Every day | ORAL | Status: DC

## 2014-10-11 MED ORDER — CITALOPRAM HYDROBROMIDE 10 MG PO TABS: 10.0000 mg | ORAL_TABLET | Freq: Every day | ORAL | Status: DC

## 2014-10-11 MED ORDER — LEVOTHYROXINE SODIUM 100 MCG PO TABS: 100.0000 ug | ORAL_TABLET | Freq: Every day | ORAL | Status: DC

## 2014-10-11 NOTE — Telephone Encounter (Signed)
Rx request from [facility] for:   Citalopram HBR 40 mg tablet  Crestor 10 mg tablet  L-Thyroxine 100 mcg tablet  Pls advise.  Pt will establish with [facility] ion 1.30.2017 but pt has a follow up on 8.8.16

## 2014-10-12 NOTE — Telephone Encounter (Signed)
All medication requested by the pharmacy sent in.

## 2014-10-14 ENCOUNTER — Other Ambulatory Visit: Payer: Self-pay | Admitting: *Deleted

## 2014-10-14 MED ORDER — LEVOTHYROXINE SODIUM 100 MCG PO TABS: 100.0000 ug | ORAL_TABLET | Freq: Every day | ORAL | Status: DC

## 2014-10-14 MED ORDER — CITALOPRAM HYDROBROMIDE 10 MG PO TABS: 10.0000 mg | ORAL_TABLET | Freq: Every day | ORAL | Status: DC

## 2014-10-18 MED ORDER — ROSUVASTATIN CALCIUM 10 MG PO TABS: 10.0000 mg | ORAL_TABLET | Freq: Every day | ORAL | Status: DC

## 2014-11-08 LAB — HM DIABETES EYE EXAM

## 2014-12-09 ENCOUNTER — Encounter: Payer: Self-pay | Admitting: Family

## 2014-12-09 ENCOUNTER — Ambulatory Visit (INDEPENDENT_AMBULATORY_CARE_PROVIDER_SITE_OTHER): Payer: Medicare Other | Admitting: Family

## 2014-12-09 VITALS — BP 132/80 | Temp 98.8°F | Ht 69.0 in | Wt 185.0 lb

## 2014-12-09 DIAGNOSIS — I10 Essential (primary) hypertension: Secondary | ICD-10-CM | POA: Diagnosis not present

## 2014-12-09 DIAGNOSIS — E039 Hypothyroidism, unspecified: Secondary | ICD-10-CM | POA: Diagnosis not present

## 2014-12-09 DIAGNOSIS — E7800 Pure hypercholesterolemia, unspecified: Secondary | ICD-10-CM

## 2014-12-09 DIAGNOSIS — E119 Type 2 diabetes mellitus without complications: Secondary | ICD-10-CM | POA: Diagnosis not present

## 2014-12-09 LAB — BASIC METABOLIC PANEL
BUN: 13 mg/dL (ref 6–23)
CALCIUM: 9.7 mg/dL (ref 8.4–10.5)
CHLORIDE: 102 meq/L (ref 96–112)
CO2: 31 meq/L (ref 19–32)
Creatinine, Ser: 0.78 mg/dL (ref 0.40–1.20)
GFR: 76.3 mL/min (ref >60.00)
GLUCOSE: 102 mg/dL — AB (ref 70–99)
POTASSIUM: 3.8 meq/L (ref 3.5–5.1)
SODIUM: 140 meq/L (ref 135–145)

## 2014-12-09 LAB — HEPATIC FUNCTION PANEL
ALT: 27 U/L (ref 0–35)
AST: 27 U/L (ref 0–37)
Albumin: 4.2 g/dL (ref 3.5–5.2)
Alkaline Phosphatase: 49 U/L (ref 39–117)
BILIRUBIN TOTAL: 1.1 mg/dL (ref 0.2–1.2)
Bilirubin, Direct: 0.2 mg/dL (ref 0.0–0.3)
Total Protein: 7.1 g/dL (ref 6.0–8.3)

## 2014-12-09 LAB — MICROALBUMIN / CREATININE URINE RATIO
Creatinine,U: 87.6 mg/dL
Microalb Creat Ratio: 1.3 mg/g (ref 0.0–30.0)
Microalb, Ur: 1.1 mg/dL (ref 0.0–1.9)

## 2014-12-09 LAB — POCT URINALYSIS DIPSTICK
BILIRUBIN UA: NEGATIVE
Blood, UA: NEGATIVE
GLUCOSE UA: NEGATIVE
KETONES UA: NEGATIVE
Nitrite, UA: NEGATIVE
PROTEIN UA: NEGATIVE
Spec Grav, UA: 1.015
Urobilinogen, UA: 0.2
pH, UA: 5.5

## 2014-12-09 LAB — CBC WITH DIFFERENTIAL/PLATELET
BASOS PCT: 0.5 % (ref 0.0–3.0)
Basophils Absolute: 0 10*3/uL (ref 0.0–0.1)
EOS PCT: 5.5 % — AB (ref 0.0–5.0)
Eosinophils Absolute: 0.3 10*3/uL (ref 0.0–0.7)
HEMATOCRIT: 44.3 % (ref 36.0–46.0)
Hemoglobin: 15 g/dL (ref 12.0–15.0)
LYMPHS ABS: 1.3 10*3/uL (ref 0.7–4.0)
Lymphocytes Relative: 24.6 % (ref 12.0–46.0)
MCHC: 33.8 g/dL (ref 30.0–36.0)
MCV: 91.5 fl (ref 78.0–100.0)
MONO ABS: 0.6 10*3/uL (ref 0.1–1.0)
Monocytes Relative: 11.4 % (ref 3.0–12.0)
Neutro Abs: 3.2 10*3/uL (ref 1.4–7.7)
Neutrophils Relative %: 58 % (ref 43.0–77.0)
Platelets: 211 10*3/uL (ref 150.0–400.0)
RBC: 4.83 Mil/uL (ref 3.87–5.11)
RDW: 13.9 % (ref 11.5–15.5)
WBC: 5.4 10*3/uL (ref 4.0–10.5)

## 2014-12-09 LAB — HEMOGLOBIN A1C: Hgb A1c MFr Bld: 6.4 % (ref 4.6–6.5)

## 2014-12-09 LAB — LIPID PANEL
CHOL/HDL RATIO: 3
CHOLESTEROL: 84 mg/dL (ref 0–200)
HDL: 33.2 mg/dL — AB (ref >39.00)
LDL CALC: 30 mg/dL (ref 0–99)
NonHDL: 50.8
Triglycerides: 106 mg/dL (ref 0.0–149.0)
VLDL: 21.2 mg/dL (ref 0.0–40.0)

## 2014-12-09 LAB — TSH: TSH: 5.15 u[IU]/mL — AB (ref 0.35–4.50)

## 2014-12-09 MED ORDER — CITALOPRAM HYDROBROMIDE 40 MG PO TABS: 40.0000 mg | ORAL_TABLET | Freq: Every day | ORAL | Status: DC

## 2014-12-09 NOTE — Progress Notes (Signed)
Subjective:    Patient ID: Kim Singh, female    DOB: July 20, 1941, 74 y.o.   MRN: [id number]  HPI 74 year old white female,nonsmoker, with a history of DM, Hypothyroidism, Hypertension, Hypercholesterolemia, and Depression is in today for a recheck. Has concerns of dizziness that occurred on Monday of this week that has since resolved. No CP, palpitations, SOB. Reports doing well overall.    Review of Systems  Constitutional: Negative.   HENT: Negative.   Respiratory: Negative.   Cardiovascular: Negative.   Gastrointestinal: Negative.   Endocrine: Negative.   Genitourinary: Negative.   Musculoskeletal: Negative.   Skin: Negative.   Allergic/Immunologic: Negative.   Neurological: Positive for dizziness. Negative for tremors, weakness, light-headedness, numbness and headaches.       Resolved  Hematological: Negative.   Psychiatric/Behavioral: Negative.    Past Medical History  Diagnosis Date  . Diabetes mellitus type 2, uncomplicated   . Unspecified essential hypertension   . Pure hypercholesterolemia   . Unspecified disorder of thyroid     History   Social History  . Marital Status: Widowed    Spouse Name: N/A  . Number of Children: N/A  . Years of Education: N/A   Occupational History  . Not on file.   Social History Main Topics  . Smoking status: Former Smoker    Quit date: 08/02/1997  . Smokeless tobacco: Never Used  . Alcohol Use: 0.0 oz/week    0 Standard drinks or equivalent per week     Comment: occ  . Drug Use: No  . Sexual Activity: Not on file   Other Topics Concern  . Not on file   Social History Narrative    Past Surgical History  Procedure Laterality Date  . Mastectomy      Family History  Problem Relation Age of Onset  . Diabetes Mother     Allergies  Allergen Reactions  . Codeine Other (See Comments)    GI upset    Current Outpatient Prescriptions on File Prior to Visit  Medication Sig Dispense Refill  . aspirin 81 MG  tablet Take 81 mg by mouth daily.    Marland Kitchen CALCIUM-VITAMIN D PO Take by mouth 2 (two) times daily.    Marland Kitchen ezetimibe (ZETIA) 10 MG tablet Take 10 mg by mouth daily.    Marland Kitchen levothyroxine (SYNTHROID) 100 MCG tablet Take 1 tablet (100 mcg total) by mouth daily before breakfast. 90 tablet 1  . metFORMIN (GLUCOPHAGE) 1000 MG tablet Take 1,000 mg by mouth daily with breakfast.    . Multiple Vitamins-Minerals (CENTRUM SILVER PO) Take by mouth daily.    . Omega-3-acid Ethyl Esters (OMACOR PO) Take by mouth 2 (two) times daily.    . quinapril (ACCUPRIL) 10 MG tablet Take 10 mg by mouth daily.    . rosuvastatin (CRESTOR) 10 MG tablet Take 1 tablet (10 mg total) by mouth daily. 90 tablet 2  . triamcinolone cream (KENALOG) 0.1 % Apply and rub in a thin film to affected area twice a day 80 g 0  . VIGAMOX 0.5 % ophthalmic solution   0   No current facility-administered medications on file prior to visit.    BP 132/80 mmHg  Temp(Src) 98.8 F (37.1 C) (Oral)  Ht 5\' 9"  (1.753 m)  Wt 185 lb (83.915 kg)  BMI 27.31 kg/m2chart     Objective:   Physical Exam  Constitutional: She is oriented to person, place, and time. She appears well-developed and well-nourished.  HENT:  Right Ear:  External ear normal.  Left Ear: External ear normal.  Nose: Nose normal.  Mouth/Throat: Oropharynx is clear and moist.  Neck: Normal range of motion. Neck supple.  Cardiovascular: Normal rate, regular rhythm and normal heart sounds.   Pulmonary/Chest: Effort normal and breath sounds normal.  Abdominal: Soft. Bowel sounds are normal.  Musculoskeletal: Normal range of motion.  Neurological: She is alert and oriented to person, place, and time.  Skin: Skin is warm and dry.  Psychiatric: She has a normal mood and affect.          Assessment & Plan:  Eris was seen today for dizziness.  Diagnoses and all orders for this visit:  Essential hypertension Orders: -     Basic Metabolic Panel -     Hepatic Function Panel -      CBC with Differential -     Microalbumin/Creatinine Ratio, Urine  Hypothyroidism, unspecified hypothyroidism type Orders: -     Basic Metabolic Panel -     TSH  Type 2 diabetes mellitus without complication Orders: -     Hemoglobin A1c -     Hepatic Function Panel -     CBC with Differential -     POC Urinalysis Dipstick -     Microalbumin/Creatinine Ratio, Urine  Pure hypercholesterolemia Orders: -     Hepatic Function Panel -     CBC with Differential -     Lipid Panel  Other orders -     citalopram (CELEXA) 40 MG tablet; Take 1 tablet (40 mg total) by mouth daily.   Call the office with any questions or concerns. Recheck in 3 months and sooner as needed.

## 2014-12-09 NOTE — Progress Notes (Signed)
Pre visit review using our clinic review tool, if applicable. No additional management support is needed unless otherwise documented below in the visit note. 

## 2014-12-09 NOTE — Patient Instructions (Signed)
Cardiac Diet This diet can help prevent heart disease and stroke. Many factors influence your heart health, including eating and exercise habits. Coronary risk rises a lot with abnormal blood fat (lipid) levels. Cardiac meal planning includes limiting unhealthy fats, increasing healthy fats, and making other small dietary changes. General guidelines are as follows:  Adjust calorie intake to reach and maintain desirable body weight.  Limit total fat intake to less than 30% of total calories. Saturated fat should be less than 7% of calories.  Saturated fats are found in animal products and in some vegetable products. Saturated vegetable fats are found in coconut oil, cocoa butter, palm oil, and palm kernel oil. Read labels carefully to avoid these products as much as possible. Use butter in moderation. Choose tub margarines and oils that have 2 grams of fat or less. Good cooking oils are canola and olive oils.  Practice low-fat cooking techniques. Do not fry food. Instead, broil, bake, boil, steam, grill, roast on a rack, stir-fry, or microwave it. Other fat reducing suggestions include:  Remove the skin from poultry.  Remove all visible fat from meats.  Skim the fat off stews, soups, and gravies before serving them.  Steam vegetables in water or broth instead of sauting them in fat.  Avoid foods with trans fat (or hydrogenated oils), such as commercially fried foods and commercially baked goods. Commercial shortening and deep-frying fats will contain trans fat.  Increase intake of fruits, vegetables, whole grains, and legumes to replace foods high in fat.  Increase consumption of nuts, legumes, and seeds to at least 4 servings weekly. One serving of a legume equals  cup, and 1 serving of nuts or seeds equals  cup.  Choose whole grains more often. Have 3 servings per day (a serving is 1 ounce [oz]).  Eat 4 to 5 servings of vegetables per day. A serving of vegetables is 1 cup of raw leafy  vegetables;  cup of raw or cooked cut-up vegetables;  cup of vegetable juice.  Eat 4 to 5 servings of fruit per day. A serving of fruit is 1 medium whole fruit;  cup of dried fruit;  cup of fresh, frozen, or canned fruit;  cup of 100% fruit juice.  Increase your intake of dietary fiber to 20 to 30 grams per day. Insoluble fiber may help lower your risk of heart disease and may help curb your appetite.  Soluble fiber binds cholesterol to be removed from the blood. Foods high in soluble fiber are dried beans, citrus fruits, oats, apples, bananas, broccoli, Brussels sprouts, and eggplant.  Try to include foods fortified with plant sterols or stanols, such as yogurt, breads, juices, or margarines. Choose several fortified foods to achieve a daily intake of 2 to 3 grams of plant sterols or stanols.  Foods with omega-3 fats can help reduce your risk of heart disease. Aim to have a 3.5 oz portion of fatty fish twice per week, such as salmon, mackerel, albacore tuna, sardines, lake trout, or herring. If you wish to take a fish oil supplement, choose one that contains 1 gram of both DHA and EPA.  Limit processed meats to 2 servings (3 oz portion) weekly.  Limit the sodium in your diet to 1500 milligrams (mg) per day. If you have high blood pressure, talk to a registered dietitian about a DASH (Dietary Approaches to Stop Hypertension) eating plan.  Limit sweets and beverages with added sugar, such as soda, to no more than 5 servings per week. One   serving is:   1 tablespoon sugar.  1 tablespoon jelly or jam.   cup sorbet.  1 cup lemonade.   cup regular soda. CHOOSING FOODS Starches  Allowed: Breads: All kinds (wheat, rye, raisin, white, oatmeal, Italian, French, and English muffin bread). Low-fat rolls: English muffins, frankfurter and hamburger buns, bagels, pita bread, tortillas (not fried). Pancakes, waffles, biscuits, and muffins made with recommended oil.  Avoid: Products made with  saturated or trans fats, oils, or whole milk products. Butter rolls, cheese breads, croissants. Commercial doughnuts, muffins, sweet rolls, biscuits, waffles, pancakes, store-bought mixes. Crackers  Allowed: Low-fat crackers and snacks: Animal, graham, rye, saltine (with recommended oil, no lard), oyster, and matzo crackers. Bread sticks, melba toast, rusks, flatbread, pretzels, and light popcorn.  Avoid: High-fat crackers: cheese crackers, butter crackers, and those made with coconut, palm oil, or trans fat (hydrogenated oils). Buttered popcorn. Cereals  Allowed: Hot or cold whole-grain cereals.  Avoid: Cereals containing coconut, hydrogenated vegetable fat, or animal fat. Potatoes / Pasta / Rice  Allowed: All kinds of potatoes, rice, and pasta (such as macaroni, spaghetti, and noodles).  Avoid: Pasta or rice prepared with cream sauce or high-fat cheese. Chow mein noodles, French fries. Vegetables  Allowed: All vegetables and vegetable juices.  Avoid: Fried vegetables. Vegetables in cream, butter, or high-fat cheese sauces. Limit coconut. Fruit in cream or custard. Protein  Allowed: Limit your intake of meat, seafood, and poultry to no more than 6 oz (cooked weight) per day. All lean, well-trimmed beef, veal, pork, and lamb. All chicken and turkey without skin. All fish and shellfish. Wild game: wild duck, rabbit, pheasant, and venison. Egg whites or low-cholesterol egg substitutes may be used as desired. Meatless dishes: recipes with dried beans, peas, lentils, and tofu (soybean curd). Seeds and nuts: all seeds and most nuts.  Avoid: Prime grade and other heavily marbled and fatty meats, such as short ribs, spare ribs, rib eye roast or steak, frankfurters, sausage, bacon, and high-fat luncheon meats, mutton. Caviar. Commercially fried fish. Domestic duck, goose, venison sausage. Organ meats: liver, gizzard, heart, chitterlings, brains, kidney, sweetbreads. Dairy  Allowed: Low-fat  cheeses: nonfat or low-fat cottage cheese (1% or 2% fat), cheeses made with part skim milk, such as mozzarella, farmers, string, or ricotta. (Cheeses should be labeled no more than 2 to 6 grams fat per oz.). Skim (or 1%) milk: liquid, powdered, or evaporated. Buttermilk made with low-fat milk. Drinks made with skim or low-fat milk or cocoa. Chocolate milk or cocoa made with skim or low-fat (1%) milk. Nonfat or low-fat yogurt.  Avoid: Whole milk cheeses, including colby, cheddar, muenster, Monterey Jack, Havarti, Brie, Camembert, American, Swiss, and blue. Creamed cottage cheese, cream cheese. Whole milk and whole milk products, including buttermilk or yogurt made from whole milk, drinks made from whole milk. Condensed milk, evaporated whole milk, and 2% milk. Soups and Combination Foods  Allowed: Low-fat low-sodium soups: broth, dehydrated soups, homemade broth, soups with the fat removed, homemade cream soups made with skim or low-fat milk. Low-fat spaghetti, lasagna, chili, and Spanish rice if low-fat ingredients and low-fat cooking techniques are used.  Avoid: Cream soups made with whole milk, cream, or high-fat cheese. All other soups. Desserts and Sweets  Allowed: Sherbet, fruit ices, gelatins, meringues, and angel food cake. Homemade desserts with recommended fats, oils, and milk products. Jam, jelly, honey, marmalade, sugars, and syrups. Pure sugar candy, such as gum drops, hard candy, jelly beans, marshmallows, mints, and small amounts of dark chocolate.  Avoid: Commercially prepared   cakes, pies, cookies, frosting, pudding, or mixes for these products. Desserts containing whole milk products, chocolate, coconut, lard, palm oil, or palm kernel oil. Ice cream or ice cream drinks. Candy that contains chocolate, coconut, butter, hydrogenated fat, or unknown ingredients. Buttered syrups. Fats and Oils  Allowed: Vegetable oils: safflower, sunflower, corn, soybean, cottonseed, sesame, canola, olive,  or peanut. Non-hydrogenated margarines. Salad dressing or mayonnaise: homemade or commercial, made with a recommended oil. Low or nonfat salad dressing or mayonnaise.  Limit added fats and oils to 6 to 8 tsp per day (includes fats used in cooking, baking, salads, and spreads on bread). Remember to count the "hidden fats" in foods.  Avoid: Solid fats and shortenings: butter, lard, salt pork, bacon drippings. Gravy containing meat fat, shortening, or suet. Cocoa butter, coconut. Coconut oil, palm oil, palm kernel oil, or hydrogenated oils: these ingredients are often used in bakery products, nondairy creamers, whipped toppings, candy, and commercially fried foods. Read labels carefully. Salad dressings made of unknown oils, sour cream, or cheese, such as blue cheese and Roquefort. Cream, all kinds: half-and-half, light, heavy, or whipping. Sour cream or cream cheese (even if "light" or low-fat). Nondairy cream substitutes: coffee creamers and sour cream substitutes made with palm, palm kernel, hydrogenated oils, or coconut oil. Beverages  Allowed: Coffee (regular or decaffeinated), tea. Diet carbonated beverages, mineral water. Alcohol: Check with your caregiver. Moderation is recommended.  Avoid: Whole milk, regular sodas, and juice drinks with added sugar. Condiments  Allowed: All seasonings and condiments. Cocoa powder. "Cream" sauces made with recommended ingredients.  Avoid: Carob powder made with hydrogenated fats. SAMPLE MENU Breakfast   cup orange juice   cup oatmeal  1 slice toast  1 tsp margarine  1 cup skim milk Lunch  Turkey sandwich with 2 oz turkey, 2 slices bread  Lettuce and tomato slices  Fresh fruit  Carrot sticks  Coffee or tea Snack  Fresh fruit or low-fat crackers Dinner  3 oz lean ground beef  1 baked potato  1 tsp margarine   cup asparagus  Lettuce salad  1 tbs non-creamy dressing   cup peach slices  1 cup skim milk Document Released:  04/17/2008 Document Revised: 01/08/2012 Document Reviewed: 09/08/2013 Exit[employer] Patient Information 2015 [employer]. This information is not intended to replace advice given to you by your health care provider. Make sure you discuss any questions you have with your health care provider.  

## 2014-12-28 ENCOUNTER — Encounter: Payer: Medicare Other | Admitting: Family

## 2015-01-14 ENCOUNTER — Encounter: Payer: Self-pay | Admitting: Family Medicine

## 2015-01-14 ENCOUNTER — Ambulatory Visit (INDEPENDENT_AMBULATORY_CARE_PROVIDER_SITE_OTHER): Payer: Medicare Other | Admitting: Family Medicine

## 2015-01-14 VITALS — BP 104/70 | HR 65 | Temp 97.7°F | Wt 186.0 lb

## 2015-01-14 DIAGNOSIS — J019 Acute sinusitis, unspecified: Secondary | ICD-10-CM

## 2015-01-14 MED ORDER — AMOXICILLIN-POT CLAVULANATE 875-125 MG PO TABS: 1.0000 | ORAL_TABLET | Freq: Two times a day (BID) | ORAL | Status: DC

## 2015-01-14 NOTE — Patient Instructions (Signed)
Suspect viral sinusitis For sore throat aspect- can use tylenol and salt water gargles Likely due to drainage from your sinuses  Viruses the body can usually clear on their own. If you are not at least 75% better by next Tuesday, take the antibiotic I gave you.   In addition if your symptoms get better then get worse, pick up the antibiotic as that is a sign of a bacteria developing on top of the virus.   If you have new or worsening symptoms (especially fever above 100.5 or shortness of breath) please call our after hours line for advice or see [place] if open

## 2015-01-14 NOTE — Progress Notes (Signed)
PCP: Kennyth Arnold, FNP  Subjective:  Kim Singh is a 74 y.o. year old very pleasant female patient who presents with sinusitis concern  Started with cough, congestion this past Sunday (Day 6 of illness today). Frontal sinus tenderness associated with this.Yesterday started with sore throat. Son in law was sick with similar symptoms and had a late fever on day 5 prompting use of antibiotics.  Feels at her worst presently (has slowly worsened this whole time). No other sick contacts. No treatments tried other than rest.   ROS-denies fever, SOB, NVD, tooth pain, wheeze  Pertinent Past Medical History- history of breast and thyroid cancer s/p resection, DM with neuropathy, well controlled  Medications- reviewed  Current Outpatient Prescriptions  Medication Sig Dispense Refill  . aspirin 81 MG tablet Take 81 mg by mouth daily.    Marland Kitchen CALCIUM-VITAMIN D PO Take by mouth 2 (two) times daily.    . citalopram (CELEXA) 40 MG tablet Take 1 tablet (40 mg total) by mouth daily. 90 tablet 1  . ezetimibe (ZETIA) 10 MG tablet Take 10 mg by mouth daily.    Marland Kitchen levothyroxine (SYNTHROID) 100 MCG tablet Take 1 tablet (100 mcg total) by mouth daily before breakfast. 90 tablet 1  . metFORMIN (GLUCOPHAGE) 1000 MG tablet Take 1,000 mg by mouth daily with breakfast.    . Multiple Vitamins-Minerals (CENTRUM SILVER PO) Take by mouth daily.    . nadolol (CORGARD) 40 MG tablet Take 40 mg by mouth daily.    . Omega-3-acid Ethyl Esters (OMACOR PO) Take by mouth 2 (two) times daily.    . quinapril (ACCUPRIL) 10 MG tablet Take 10 mg by mouth daily.    . rosuvastatin (CRESTOR) 10 MG tablet Take 1 tablet (10 mg total) by mouth daily. 90 tablet 2  . triamcinolone cream (KENALOG) 0.1 % Apply and rub in a thin film to affected area twice a day 80 g 0  . VIGAMOX 0.5 % ophthalmic solution   0   Objective: BP 104/70 mmHg  Pulse 65  Temp(Src) 97.7 F (36.5 C)  Wt 186 lb (84.369 kg)  SpO2 93% Gen: NAD, resting  comfortably HEENT: Turbinates erythematous with some yellow drainage, TM normal, pharynx mildly erythematous with no tonsilar exudate or edema, frontal sinus tenderness CV: RRR no murmurs rubs or gallops Lungs: CTAB no crackles, wheeze, rhonchi, perhaps very slight decrease in LLL (discussed with patient strict return precautions) Abdomen: soft/nontender/nondistended/normal bowel sounds. No rebound or guarding.  Ext: no edema Skin: warm, dry, no rash Neuro: grossly normal, moves all extremities   Assessment/Plan:  Sinusitis  Day 6 of symptoms. Discussed high probability viral. I am surprised this is her worst day of symptoms at day 6 and also given family member with bacterial sinusitis, believe bacterial cause more likely. Rx given with strict indications to take only if has double sickening or persistent symptoms beyond 10 days. I also was concerned given perhaps slight decreased breath sounds LLL and discussed with patient my concern about pneumonia development and gave return precautions specifically for that.   Finally, we reviewed reasons to return to care including if symptoms worsen or persist or new concerns arise.  Meds ordered this encounter  Medications  . amoxicillin-clavulanate (AUGMENTIN) 875-125 MG per tablet    Sig: Take 1 tablet by mouth 2 (two) times daily.    Dispense:  14 tablet    Refill:  0    Only take if symptoms get better then get worse or if symptoms persist  to 01/18/15

## 2015-02-28 ENCOUNTER — Encounter: Payer: Self-pay | Admitting: Family Medicine

## 2015-02-28 ENCOUNTER — Ambulatory Visit (INDEPENDENT_AMBULATORY_CARE_PROVIDER_SITE_OTHER): Payer: Medicare Other | Admitting: Family Medicine

## 2015-02-28 ENCOUNTER — Ambulatory Visit: Payer: Medicare Other | Admitting: Family Medicine

## 2015-02-28 VITALS — BP 122/70 | HR 67 | Temp 97.6°F | Wt 190.0 lb

## 2015-02-28 DIAGNOSIS — E119 Type 2 diabetes mellitus without complications: Secondary | ICD-10-CM | POA: Diagnosis not present

## 2015-02-28 DIAGNOSIS — F32A Depression, unspecified: Secondary | ICD-10-CM | POA: Insufficient documentation

## 2015-02-28 DIAGNOSIS — I10 Essential (primary) hypertension: Secondary | ICD-10-CM

## 2015-02-28 DIAGNOSIS — F329 Major depressive disorder, single episode, unspecified: Secondary | ICD-10-CM | POA: Insufficient documentation

## 2015-02-28 DIAGNOSIS — E039 Hypothyroidism, unspecified: Secondary | ICD-10-CM | POA: Diagnosis not present

## 2015-02-28 DIAGNOSIS — Z87891 Personal history of nicotine dependence: Secondary | ICD-10-CM | POA: Insufficient documentation

## 2015-02-28 NOTE — Assessment & Plan Note (Signed)
S: controlled previously on metformin 1g BID.  A/P: check a1c after the 19th of month. Suspect will be controlled and continue current rx. Advised eye exam.

## 2015-02-28 NOTE — Progress Notes (Signed)
Garret Reddish, MD  Subjective:  Kim Singh is a 74 y.o. year old very pleasant female patient who presents with:  See problem oriented charting ROS- no hypoglycemia or blurry vision.  No hair or nail changes. No heat/cold intolerance. No constipation or diarrhea. Denies shakiness or anxiety.  Past Medical History- dyslipidemia, depression  Medications- reviewed and updated Current Outpatient Prescriptions  Medication Sig Dispense Refill  . aspirin 81 MG tablet Take 81 mg by mouth daily.    Marland Kitchen CALCIUM-VITAMIN D PO Take by mouth 2 (two) times daily.    . citalopram (CELEXA) 40 MG tablet Take 1 tablet (40 mg total) by mouth daily. 90 tablet 1  . ezetimibe (ZETIA) 10 MG tablet Take 10 mg by mouth daily.    Marland Kitchen levothyroxine (SYNTHROID) 100 MCG tablet Take 1 tablet (100 mcg total) by mouth daily before breakfast. 90 tablet 1  . metFORMIN (GLUCOPHAGE) 1000 MG tablet Take 1,000 mg by mouth daily with breakfast.    . Multiple Vitamins-Minerals (CENTRUM SILVER PO) Take by mouth daily.    . nadolol (CORGARD) 40 MG tablet Take 40 mg by mouth daily.    . Omega-3-acid Ethyl Esters (OMACOR PO) Take by mouth 2 (two) times daily.    . quinapril (ACCUPRIL) 10 MG tablet Take 10 mg by mouth daily.    . rosuvastatin (CRESTOR) 10 MG tablet Take 1 tablet (10 mg total) by mouth daily. 90 tablet 2   Objective: BP 122/70 mmHg  Pulse 67  Temp(Src) 97.6 F (36.4 C)  Wt 190 lb (86.183 kg) Gen: NAD, resting comfortably Mucous membranes are moist. CV: RRR no murmurs rubs or gallops Lungs: CTAB no crackles, wheeze, rhonchi Abdomen: soft/nontender/nondistended/normal bowel sounds.  Ext: no edema Skin: warm, dry, no rash Neuro: grossly normal, moves all extremities   Assessment/Plan:  Hypothyroidism S: last TSH mildly elevated and no changes made. Asymptomatic. Suspect mild poor control on levothyroxine 166mcg A/P: check TSH again with labs in a few weeks, adjust prn   Diabetes S: controlled  previously on metformin 1g BID.  A/P: check a1c after the 19th of month. Suspect will be controlled and continue current rx. Advised eye exam.    Essential hypertension S: controlled on  Nadolol 40mg , quinapril 10mg  A/P: continue current rx  labs on 20th. Follow up January after that point for AWV  Orders Placed This Encounter  Procedures  . TSH    [place]    Standing Status: Future     Number of Occurrences:      Standing Expiration Date: 02/28/2016  . Hemoglobin A1c    [place]    Standing Status: Future     Number of Occurrences:      Standing Expiration Date: 02/28/2016

## 2015-02-28 NOTE — Patient Instructions (Addendum)
Get mammogram and eye appt scheduled and have results faxed to [place] at [phone]. See handout.   Come back for labs on 03/12/15 or later. Does not have to be fasting but ok if you are fasting.   Get a flu shot in October or November- call for an appointment with our flu clinic that month

## 2015-02-28 NOTE — Assessment & Plan Note (Signed)
S: last TSH mildly elevated and no changes made. Asymptomatic. Suspect mild poor control on levothyroxine 174mcg A/P: check TSH again with labs in a few weeks, adjust prn

## 2015-02-28 NOTE — Assessment & Plan Note (Signed)
S: controlled on  Nadolol 40mg , quinapril 10mg  A/P: continue current rx

## 2015-03-02 ENCOUNTER — Other Ambulatory Visit: Payer: Self-pay

## 2015-03-02 DIAGNOSIS — Z1231 Encounter for screening mammogram for malignant neoplasm of breast: Secondary | ICD-10-CM

## 2015-03-08 ENCOUNTER — Ambulatory Visit
Admission: RE | Admit: 2015-03-08 | Discharge: 2015-03-08 | Disposition: A | Discharge status: Home / Self Care | Payer: Medicare Other | Source: Ambulatory Visit

## 2015-03-08 DIAGNOSIS — Z1231 Encounter for screening mammogram for malignant neoplasm of breast: Secondary | ICD-10-CM | POA: Diagnosis not present

## 2015-03-15 ENCOUNTER — Other Ambulatory Visit (INDEPENDENT_AMBULATORY_CARE_PROVIDER_SITE_OTHER): Payer: Medicare Other

## 2015-03-15 DIAGNOSIS — E039 Hypothyroidism, unspecified: Secondary | ICD-10-CM

## 2015-03-15 DIAGNOSIS — E119 Type 2 diabetes mellitus without complications: Secondary | ICD-10-CM | POA: Diagnosis not present

## 2015-03-15 LAB — HEMOGLOBIN A1C: Hgb A1c MFr Bld: 6.3 % (ref 4.6–6.5)

## 2015-03-15 LAB — TSH: TSH: 14.3 u[IU]/mL — ABNORMAL HIGH (ref 0.35–4.50)

## 2015-03-16 MED ORDER — LEVOTHYROXINE SODIUM 112 MCG PO TABS: 112.0000 ug | ORAL_TABLET | Freq: Every day | ORAL | Status: DC

## 2015-03-17 ENCOUNTER — Encounter: Payer: Self-pay | Admitting: Family Medicine

## 2015-03-21 ENCOUNTER — Other Ambulatory Visit: Payer: Self-pay | Admitting: *Deleted

## 2015-03-21 MED ORDER — ROSUVASTATIN CALCIUM 10 MG PO TABS: 10.0000 mg | ORAL_TABLET | Freq: Every day | ORAL | Status: DC

## 2015-03-21 MED ORDER — CITALOPRAM HYDROBROMIDE 40 MG PO TABS: 40.0000 mg | ORAL_TABLET | Freq: Every day | ORAL | Status: DC

## 2015-03-21 MED ORDER — NADOLOL 40 MG PO TABS: 40.0000 mg | ORAL_TABLET | Freq: Every day | ORAL | Status: DC

## 2015-03-21 MED ORDER — METFORMIN HCL 1000 MG PO TABS: 1000.0000 mg | ORAL_TABLET | Freq: Every day | ORAL | Status: DC

## 2015-03-21 MED ORDER — QUINAPRIL HCL 10 MG PO TABS: 10.0000 mg | ORAL_TABLET | Freq: Every day | ORAL | Status: DC

## 2015-03-21 MED ORDER — EZETIMIBE 10 MG PO TABS: 10.0000 mg | ORAL_TABLET | Freq: Every day | ORAL | Status: DC

## 2015-03-21 MED ORDER — LEVOTHYROXINE SODIUM 112 MCG PO TABS: 112.0000 ug | ORAL_TABLET | Freq: Every day | ORAL | Status: DC

## 2015-03-30 ENCOUNTER — Other Ambulatory Visit: Payer: Self-pay | Admitting: *Deleted

## 2015-03-30 MED ORDER — LEVOTHYROXINE SODIUM 112 MCG PO TABS: 112.0000 ug | ORAL_TABLET | Freq: Every day | ORAL | Status: DC

## 2015-08-15 ENCOUNTER — Telehealth: Payer: Self-pay | Admitting: Family Medicine

## 2015-08-15 MED ORDER — CITALOPRAM HYDROBROMIDE 40 MG PO TABS: 40.0000 mg | ORAL_TABLET | Freq: Every day | ORAL | Status: DC

## 2015-08-15 NOTE — Telephone Encounter (Signed)
Medication sent into local pharmacy.  

## 2015-08-15 NOTE — Telephone Encounter (Signed)
Pt needs new rx citalopram 40 mg #14 send to [facility] [address]. Pt is waiting on mail order. Pt is out

## 2015-08-22 ENCOUNTER — Ambulatory Visit (INDEPENDENT_AMBULATORY_CARE_PROVIDER_SITE_OTHER): Payer: Medicare Other | Admitting: Family Medicine

## 2015-08-22 ENCOUNTER — Encounter: Payer: Self-pay | Admitting: Family Medicine

## 2015-08-22 VITALS — BP 116/80 | HR 70 | Temp 98.9°F | Wt 190.0 lb

## 2015-08-22 DIAGNOSIS — Z0000 Encounter for general adult medical examination without abnormal findings: Secondary | ICD-10-CM | POA: Diagnosis not present

## 2015-08-22 DIAGNOSIS — F32A Depression, unspecified: Secondary | ICD-10-CM

## 2015-08-22 DIAGNOSIS — E1149 Type 2 diabetes mellitus with other diabetic neurological complication: Secondary | ICD-10-CM

## 2015-08-22 DIAGNOSIS — Z23 Encounter for immunization: Secondary | ICD-10-CM

## 2015-08-22 DIAGNOSIS — I10 Essential (primary) hypertension: Secondary | ICD-10-CM | POA: Diagnosis not present

## 2015-08-22 DIAGNOSIS — E1140 Type 2 diabetes mellitus with diabetic neuropathy, unspecified: Secondary | ICD-10-CM | POA: Insufficient documentation

## 2015-08-22 DIAGNOSIS — E039 Hypothyroidism, unspecified: Secondary | ICD-10-CM

## 2015-08-22 DIAGNOSIS — E1142 Type 2 diabetes mellitus with diabetic polyneuropathy: Secondary | ICD-10-CM | POA: Diagnosis not present

## 2015-08-22 DIAGNOSIS — F329 Major depressive disorder, single episode, unspecified: Secondary | ICD-10-CM

## 2015-08-22 LAB — HEMOGLOBIN A1C: Hgb A1c MFr Bld: 6.8 % — ABNORMAL HIGH (ref 4.6–6.5)

## 2015-08-22 LAB — BASIC METABOLIC PANEL
BUN: 21 mg/dL (ref 6–23)
CHLORIDE: 103 meq/L (ref 96–112)
CO2: 33 meq/L — AB (ref 19–32)
Calcium: 9.2 mg/dL (ref 8.4–10.5)
Creatinine, Ser: 0.8 mg/dL (ref 0.40–1.20)
GFR: 73.97 mL/min (ref >60.00)
GLUCOSE: 126 mg/dL — AB (ref 70–99)
POTASSIUM: 4.9 meq/L (ref 3.5–5.1)
SODIUM: 142 meq/L (ref 135–145)

## 2015-08-22 LAB — TSH: TSH: 0.53 u[IU]/mL (ref 0.35–4.50)

## 2015-08-22 MED ORDER — CITALOPRAM HYDROBROMIDE 40 MG PO TABS: 40.0000 mg | ORAL_TABLET | Freq: Every day | ORAL | Status: DC

## 2015-08-22 NOTE — Assessment & Plan Note (Signed)
S: well controlled with phq2 of 0. She would like to trial to reduce medicine A/P: going to use 1/2 tab for next few months- if symptoms worsen will inform me and increase dose back up

## 2015-08-22 NOTE — Assessment & Plan Note (Addendum)
S: controlled on metformin 1000mg  daily.  Lab Results  Component Value Date   HGBA1C 6.3 03/15/2015  A/P: check a1c today, suspect remains controlled. Patient does have occasional dizziness- which may be orthostatic as does not remain hydrated but have asked her to check blood sugar to make sure not low during this time. She also informs me of neuropathy -see separate discussion

## 2015-08-22 NOTE — Assessment & Plan Note (Signed)
S: poor control at last visit. Sent in 112 mcg. Patient not 100% sure of dose but knows she is taking medicine Lab Results  Component Value Date   TSH 14.30* 03/15/2015  A/P: update TSH then reach out to patient and check on current dosage which she will check at home. Change as needed

## 2015-08-22 NOTE — Assessment & Plan Note (Signed)
S: controlled. On  Nadolol 40mg , quinapril 10mg  BP Readings from Last 3 Encounters:  08/22/15 116/80  02/28/15 122/70  01/14/15 104/70  A/P:Continue current meds:  We discussed reducing nadolol given potential orthostatic symptoms- she is hesitant to do this at this time

## 2015-08-22 NOTE — Patient Instructions (Addendum)
Before you go 1. Stop by lab 2. Stop by check out desk to schedule next appointment and sign release for records as below  Final pneumonia shot received today ([id number]).  Sign release of information at the check out desk for records from your last eye exam when you figure out the doctors name.   I think it is reasonable to take 1/2 a pill of the citalopram. If you have any worsening feelings of depression let us know immediately. We may cut down further in 6 months.   When you have dizzy episodes please make sure that your sugar is at least 80 or so      Kim Singh , Thank you for taking time to come for your Medicare Wellness Visit. I appreciate your ongoing commitment to your health goals. Please review the following plan we discussed and let me know if I can assist you in the future.   These are the goals we discussed: 1. Exercise at least 3 days a week. Thrilled you have at least been looking at gyms- that is progress 2. Get yearly eye exam and fax [place] a copy of most recent   This is a list of the screening recommended for you and due dates:  Health Maintenance  Topic Date Due  . Eye exam for diabetics  11[birth year]10/1950  . Tetanus Vaccine  11/10/1957  . Flu Shot  08/21/2016*  . Shingles Vaccine  03/08/2054*  . Hemoglobin A1C  09/15/2015  . Complete foot exam   12/09/2015  . DEXA scan (bone density measurement)  Completed  . Pneumonia vaccines  Completed  *Topic was postponed. The date shown is not the original due date.

## 2015-08-22 NOTE — Progress Notes (Signed)
Garret Reddish, MD Phone: [phone]  Subjective:  Patient presents today for their annual wellness visit, also for official visit to establish care  Preventive Screening-Counseling & Management  Smoking Status: former Smoker quit 1999 after 41 pack years Second Hand Smoking status: No smokers in home  Risk Factors Regular exercise: no but encouraged Diet: some overeating  Fall Risk: None   Cardiac risk factors:  advanced age (older than 61 for men, 58 for women)  Hyperlipidemia  Well controlled on crestor 10mg  Diabetes is controlled Family History: denies   Depression Screen None. PHQ2 0 on celexa  Activities of Daily Living Independent ADLs and IADLs   Hearing Difficulties: -patient got [employer] hearing aids  Cognitive Testing No reported trouble.   Normal 3 word recall   List the Names of Other Physician/Practitioners you currently use: -Dr. Tommy Rainwater optho for cataracts and Dr. Peter Garter optho  Immunization History  Administered Date(s) Administered  . Influenza, High Dose Seasonal PF 06/28/2014  . Pneumococcal Conjugate-13 06/28/2014   Required Immunizations needed today pneumovax 23 give. Declines flu Immunization History  Administered Date(s) Administered  . Influenza, High Dose Seasonal PF 06/28/2014  . Pneumococcal Conjugate-13 06/28/2014  . Pneumococcal Polysaccharide-23 08/22/2015   Screening tests- up to date Health Maintenance Due  Topic Date Due  . OPHTHALMOLOGY EXAM - request today 11/10/1950  . TETANUS/TDAP - insurance not covered 11/10/1957  . ZOSTAVAX - declines 11/11/1998   ROS- No pertinent positives discovered in course of AWV. Problem oriented ros- no chest pain or shortness of breath. Occasional lightheadedness at times with standing, sometimes if turning over in bed. Denies other obvious hypoglycemia symptoms.   The following were reviewed and entered/updated in epic: Past Medical History  Diagnosis Date  . Diabetes mellitus  type 2, uncomplicated ([place])   . Unspecified essential hypertension   . Pure hypercholesterolemia   . Unspecified disorder of thyroid    Patient Active Problem List   Diagnosis Date Noted  . Type II diabetes mellitus with neurological manifestations ([place]) 11/29/2013    Priority: High  . Diabetic neuropathy ([place]) 08/22/2015    Priority: Medium  . Depression 02/28/2015    Priority: Medium  . Dyslipidemia 11/29/2013    Priority: Medium  . Hypothyroidism 11/29/2013    Priority: Medium  . Essential hypertension 11/29/2013    Priority: Medium  . Former smoker 02/28/2015    Priority: Low  . History of breast cancer 11/30/2013    Priority: Low  . History of thyroid cancer 11/30/2013    Priority: Low  . Orthostatic hypotension 11/29/2013    Priority: Low   Past Surgical History  Procedure Laterality Date  . Breast lumpectomy      L  . Thyroidectomy      total    Family History  Problem Relation Age of Onset  . Diabetes Mother     Medications- reviewed and updated Current Outpatient Prescriptions  Medication Sig Dispense Refill  . aspirin 81 MG tablet Take 81 mg by mouth daily.    Marland Kitchen CALCIUM-VITAMIN D PO Take by mouth 2 (two) times daily.    . citalopram (CELEXA) 40 MG tablet Take 1 tablet (40 mg total) by mouth daily. 14 tablet 0  . ezetimibe (ZETIA) 10 MG tablet Take 1 tablet (10 mg total) by mouth daily. 90 tablet 1  . levothyroxine (SYNTHROID, LEVOTHROID) 112 MCG tablet Take 1 tablet (112 mcg total) by mouth daily. 90 tablet 1  . metFORMIN (GLUCOPHAGE) 1000 MG tablet Take 1 tablet (1,000 mg  total) by mouth daily with breakfast. 90 tablet 1  . Multiple Vitamins-Minerals (CENTRUM SILVER PO) Take by mouth daily.    . nadolol (CORGARD) 40 MG tablet Take 1 tablet (40 mg total) by mouth daily. 90 tablet 1  . Omega-3-acid Ethyl Esters (OMACOR PO) Take by mouth 2 (two) times daily.    . quinapril (ACCUPRIL) 10 MG tablet Take 1 tablet (10 mg total) by mouth daily. 90 tablet 1  .  rosuvastatin (CRESTOR) 10 MG tablet Take 1 tablet (10 mg total) by mouth daily. 90 tablet 1   Allergies-reviewed and updated Allergies  Allergen Reactions  . Codeine Other (See Comments)    GI upset    Social History   Social History  . Marital Status: Widowed    Spouse Name: N/A  . Number of Children: N/A  . Years of Education: N/A   Social History Main Topics  . Smoking status: Former Smoker -- 1.00 packs/day for 41 years    Types: Cigarettes    Quit date: 08/02/1997  . Smokeless tobacco: Never Used  . Alcohol Use: 0.0 oz/week    0 Standard drinks or equivalent per week     Comment: occ  . Drug Use: No  . Sexual Activity: Not Asked   Other Topics Concern  . None   Social History Narrative   Widowed 2008. 3 children. 4 grandkids. No greatgrandkids.    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       Hobbies: in [place] had a widowed group, here joined a church with kids, Psychologist, occupational work. Looking for friends network    Objective: BP 116/80 mmHg  Pulse 70  Temp(Src) 98.9 F (37.2 C)  Wt 190 lb (86.183 kg) Gen: NAD, resting comfortably HEENT: Mucous membranes are moist. Oropharynx normal Neck: No thyroid present- scar from thyroidectomy noted CV: RRR no murmurs rubs or gallops Lungs: CTAB no crackles, wheeze, rhonchi Abdomen: soft/nontender/nondistended/normal bowel sounds. No rebound or guarding.  Ext: no edema Skin: warm, dry Neuro: grossly normal, moves all extremities, PERRLA  Assessment/Plan:  AWV completed  Hypothyroidism S: poor control at last visit. Sent in 112 mcg. Patient not 100% sure of dose but knows she is taking medicine Lab Results  Component Value Date   TSH 14.30* 03/15/2015  A/P: update TSH then reach out to patient and check on current dosage which she will check at home. Change as needed  Type II  diabetes mellitus with neurological manifestations ([place]) S: controlled on metformin 1000mg  daily.  Lab Results  Component Value Date   HGBA1C 6.3 03/15/2015  A/P: check a1c today, suspect remains controlled. Patient does have occasional dizziness- which may be orthostatic as does not remain hydrated but have asked her to check blood sugar to make sure not low during this time. She also informs me of neuropathy -see separate discussion  Diabetic neuropathy ([place]) S:Neuropathy since 2008. Some numbness/tingling in bottom of both feet. Very cool feeling at night. Sometimes feels off balance even if not dizzy.  A/P: we discussed medication management. She saw her husband take medicine for this and she states she does not want to take medicine for this at this time but will update me if changes her mind. Fortunately no falls or stumbling have occurred    Essential hypertension S: controlled. On  Nadolol 40mg , quinapril 10mg  BP Readings from Last  3 Encounters:  08/22/15 116/80  02/28/15 122/70  01/14/15 104/70  A/P:Continue current meds:  We discussed reducing nadolol given potential orthostatic symptoms- she is hesitant to do this at this time   Depression S: well controlled with phq2 of 0. She would like to trial to reduce medicine A/P: going to use 1/2 tab for next few months- if symptoms worsen will inform me and increase dose back up     Return in about 4 months (around 12/20/2015). Return precautions advised.   Orders Placed This Encounter  Procedures  . Pneumococcal polysaccharide vaccine 23-valent greater than or equal to 2yo subcutaneous/IM  . TSH    [place]  . Hemoglobin A1c    [place]  . Basic metabolic panel    [place]    Meds ordered this encounter  Medications  . citalopram (CELEXA) 40 MG tablet    Sig: Take 1 tablet (40 mg total) by mouth daily.    Dispense:  90 tablet    Refill:  3

## 2015-08-22 NOTE — Assessment & Plan Note (Signed)
S:Neuropathy since 2008. Some numbness/tingling in bottom of both feet. Very cool feeling at night. Sometimes feels off balance even if not dizzy.  A/P: we discussed medication management. She saw her husband take medicine for this and she states she does not want to take medicine for this at this time but will update me if changes her mind. Fortunately no falls or stumbling have occurred

## 2015-08-23 ENCOUNTER — Encounter: Payer: Self-pay | Admitting: Family Medicine

## 2015-09-16 ENCOUNTER — Other Ambulatory Visit: Payer: Self-pay | Admitting: Family Medicine

## 2015-10-20 ENCOUNTER — Other Ambulatory Visit: Payer: Self-pay | Admitting: Family Medicine

## 2015-11-08 DIAGNOSIS — E119 Type 2 diabetes mellitus without complications: Secondary | ICD-10-CM | POA: Diagnosis not present

## 2015-11-08 LAB — HM DIABETES EYE EXAM

## 2015-11-18 ENCOUNTER — Encounter: Payer: Self-pay | Admitting: Family Medicine

## 2015-12-21 ENCOUNTER — Encounter: Payer: Self-pay | Admitting: Family Medicine

## 2015-12-21 ENCOUNTER — Ambulatory Visit (INDEPENDENT_AMBULATORY_CARE_PROVIDER_SITE_OTHER): Payer: Medicare Other | Admitting: Family Medicine

## 2015-12-21 VITALS — BP 108/74 | HR 68 | Temp 97.9°F | Ht 69.0 in | Wt 186.0 lb

## 2015-12-21 DIAGNOSIS — I10 Essential (primary) hypertension: Secondary | ICD-10-CM

## 2015-12-21 DIAGNOSIS — E1149 Type 2 diabetes mellitus with other diabetic neurological complication: Secondary | ICD-10-CM | POA: Diagnosis not present

## 2015-12-21 DIAGNOSIS — F329 Major depressive disorder, single episode, unspecified: Secondary | ICD-10-CM | POA: Diagnosis not present

## 2015-12-21 DIAGNOSIS — E039 Hypothyroidism, unspecified: Secondary | ICD-10-CM

## 2015-12-21 DIAGNOSIS — F32A Depression, unspecified: Secondary | ICD-10-CM

## 2015-12-21 LAB — POCT GLYCOSYLATED HEMOGLOBIN (HGB A1C): Hemoglobin A1C: 6.2

## 2015-12-21 MED ORDER — TETANUS-DIPHTH-ACELL PERTUSSIS 5-2.5-18.5 LF-MCG/0.5 IM SUSP: 0.5000 mL | Freq: Once | INTRAMUSCULAR | Status: DC

## 2015-12-21 NOTE — Assessment & Plan Note (Signed)
S: Lab Results  Component Value Date   TSH 0.53 08/22/2015   On thyroid medication-levothyroxine 144mcg ROS-No hair or nail changes. No heat/cold intolerance. No constipation or diarrhea. Denies shakiness or anxiety.  A/P: doing well- update TSH next visit

## 2015-12-21 NOTE — Patient Instructions (Signed)
No changes to medication  Lab Results  Component Value Date   HGBA1C 6.2 12/21/2015  diabetes looks great! Keep up the good work. 4 lbs weight loss has helped  If you want to stretch visit to 6 months that is reasonable  I would also like for you to sign up for an annual wellness visit on a Friday with our nurse Manuela Schwartz. This is a free benefit under medicare that may help [place] find additional ways to help you.

## 2015-12-21 NOTE — Assessment & Plan Note (Signed)
S: well controlled. On metformin 1000mg  daily. No Lows CBGs- checked sugar when lightheaded and was 130s or so Exercise and diet- advised to start exercise, have to watch out on gatorades if sugars going up  Lab Results  Component Value Date   HGBA1C 6.8* 08/22/2015   HGBA1C 6.3 03/15/2015   HGBA1C 6.4 12/09/2014   A/P: a1c looks great at 6.2- continue current metformin 1000mg  once daily

## 2015-12-21 NOTE — Assessment & Plan Note (Signed)
S: phq2 of 0. Patient was going to trial half of 40mg  pill but has not- doing well A/P: continue current dosage

## 2015-12-21 NOTE — Progress Notes (Signed)
Subjective:  Kim Singh is a 75 y.o. year old very pleasant female patient who presents for/with See problem oriented charting ROS- No chest pain or shortness of breath. No headache or blurry vision. No hypoglycemia. Occasional mild lightheadedness improves with gatorade (but sugar has not been low).see any ROS included in HPI as well.   Past Medical History-  Patient Active Problem List   Diagnosis Date Noted  . Type II diabetes mellitus with neurological manifestations ([place]) 11/29/2013    Priority: High  . Diabetic neuropathy ([place]) 08/22/2015    Priority: Medium  . Depression 02/28/2015    Priority: Medium  . Dyslipidemia 11/29/2013    Priority: Medium  . Hypothyroidism 11/29/2013    Priority: Medium  . Essential hypertension 11/29/2013    Priority: Medium  . Former smoker 02/28/2015    Priority: Low  . History of breast cancer 11/30/2013    Priority: Low  . History of thyroid cancer 11/30/2013    Priority: Low  . Orthostatic hypotension 11/29/2013    Priority: Low    Medications- reviewed and updated Current Outpatient Prescriptions  Medication Sig Dispense Refill  . aspirin 81 MG tablet Take 81 mg by mouth daily.    Marland Kitchen CALCIUM-VITAMIN D PO Take by mouth 2 (two) times daily.    . citalopram (CELEXA) 40 MG tablet Take 1 tablet (40 mg total) by mouth daily. 90 tablet 3  . CRESTOR 10 MG tablet TAKE 1 TABLET DAILY 90 tablet 3  . levothyroxine (SYNTHROID, LEVOTHROID) 112 MCG tablet TAKE 1 TABLET DAILY 90 tablet 3  . metFORMIN (GLUCOPHAGE) 1000 MG tablet TAKE 1 TABLET DAILY WITH BREAKFAST 90 tablet 3  . nadolol (CORGARD) 40 MG tablet TAKE 1 TABLET DAILY 90 tablet 3  . Omega-3-acid Ethyl Esters (OMACOR PO) Take by mouth 2 (two) times daily.    . quinapril (ACCUPRIL) 10 MG tablet TAKE 1 TABLET DAILY 90 tablet 3  . ZETIA 10 MG tablet TAKE 1 TABLET DAILY 90 tablet 3   No current facility-administered medications for this visit.    Objective: BP 108/74 mmHg  Pulse 68   Temp(Src) 97.9 F (36.6 C) (Oral)  Ht 5\' 9"  (1.753 m)  Wt 186 lb (84.369 kg)  BMI 27.45 kg/m2  SpO2 92% Gen: NAD, resting comfortably CV: RRR no murmurs rubs or gallops Lungs: CTAB no crackles, wheeze, rhonchi Abdomen: soft/nontender/nondistended/normal bowel sounds. overweight Ext: no edema Skin: warm, dry, no rash Neuro: grossly normal, moves all extremities  Assessment/Plan:  Hypothyroidism S: Lab Results  Component Value Date   TSH 0.53 08/22/2015   On thyroid medication-levothyroxine 12mcg ROS-No hair or nail changes. No heat/cold intolerance. No constipation or diarrhea. Denies shakiness or anxiety.  A/P: doing well- update TSH next visit  Essential hypertension S: controlled. On nadolol 40mg  and quinapril 10mg . Lightheadedness in past- Sparing gatorade seems to help if lightheaded (but sugar not low). Working to stay better hydrated- warned of affects on sugar if using gatorade BP Readings from Last 3 Encounters:  12/21/15 108/74  08/22/15 116/80  02/28/15 122/70  A/P:Continue current meds:  Continue to monitor for orthostatic symptoms  Depression S: phq2 of 0. Patient was going to trial half of 40mg  pill but has not- doing well A/P: continue current dosage  Type II diabetes mellitus with neurological manifestations ([place]) S: well controlled. On metformin 1000mg  daily. No Lows CBGs- checked sugar when lightheaded and was 130s or so Exercise and diet- advised to start exercise, have to watch out on gatorades if sugars  going up  Lab Results  Component Value Date   HGBA1C 6.8* 08/22/2015   HGBA1C 6.3 03/15/2015   HGBA1C 6.4 12/09/2014   A/P: a1c looks great at 6.2- continue current metformin 1000mg  once daily   Return in about 4 months (around 04/21/2016). Return precautions advised.   Orders Placed This Encounter  Procedures  . POC HgB A1c   Meds ordered this encounter  Medications  . Tdap (BOOSTRIX) 5-2.5-18.5 LF-MCG/0.5 injection    Sig: Inject 0.5 mLs  into the muscle once.    Dispense:  0.5 mL    Refill:  0  to take to pharmacy to see if covered with Part D Garret Reddish, MD

## 2015-12-21 NOTE — Assessment & Plan Note (Signed)
S: controlled. On nadolol 40mg  and quinapril 10mg . Lightheadedness in past- Sparing gatorade seems to help if lightheaded (but sugar not low). Working to stay better hydrated- warned of affects on sugar if using gatorade BP Readings from Last 3 Encounters:  12/21/15 108/74  08/22/15 116/80  02/28/15 122/70  A/P:Continue current meds:  Continue to monitor for orthostatic symptoms

## 2016-02-13 ENCOUNTER — Other Ambulatory Visit: Payer: Self-pay | Admitting: Family Medicine

## 2016-02-13 DIAGNOSIS — Z1231 Encounter for screening mammogram for malignant neoplasm of breast: Secondary | ICD-10-CM

## 2016-03-09 ENCOUNTER — Ambulatory Visit
Admission: RE | Admit: 2016-03-09 | Discharge: 2016-03-09 | Disposition: A | Discharge status: Home / Self Care | Payer: Medicare Other | Source: Ambulatory Visit | Attending: Family Medicine | Admitting: Family Medicine

## 2016-03-09 DIAGNOSIS — Z1231 Encounter for screening mammogram for malignant neoplasm of breast: Secondary | ICD-10-CM | POA: Diagnosis not present

## 2016-04-05 ENCOUNTER — Encounter: Payer: Self-pay | Admitting: Family Medicine

## 2016-04-06 ENCOUNTER — Encounter: Payer: Self-pay | Admitting: Family Medicine

## 2016-04-06 ENCOUNTER — Ambulatory Visit (INDEPENDENT_AMBULATORY_CARE_PROVIDER_SITE_OTHER): Payer: Medicare Other | Admitting: Family Medicine

## 2016-04-06 VITALS — BP 120/68 | HR 70 | Temp 98.2°F | Wt 185.8 lb

## 2016-04-06 DIAGNOSIS — N390 Urinary tract infection, site not specified: Secondary | ICD-10-CM | POA: Diagnosis not present

## 2016-04-06 DIAGNOSIS — Z23 Encounter for immunization: Secondary | ICD-10-CM | POA: Diagnosis not present

## 2016-04-06 LAB — POC URINALSYSI DIPSTICK (AUTOMATED)
GLUCOSE UA: NEGATIVE
KETONES UA: NEGATIVE
NITRITE UA: POSITIVE
PH UA: 7
Spec Grav, UA: 1.02
Urobilinogen, UA: 1

## 2016-04-06 MED ORDER — CEPHALEXIN 500 MG PO CAPS: 500.0000 mg | ORAL_CAPSULE | Freq: Three times a day (TID) | ORAL | 0 refills | Status: DC

## 2016-04-06 NOTE — Progress Notes (Signed)
Pre visit review using our clinic review tool, if applicable. No additional management support is needed unless otherwise documented below in the visit note. 

## 2016-04-06 NOTE — Progress Notes (Signed)
Subjective:  Kim Singh is a 75 y.o. year old very pleasant female patient who presents for/with See problem oriented charting ROS- no fever, chills, nausea, vomiting, cva pain.see any ROS included in HPI as well.   Past Medical History-  Patient Active Problem List   Diagnosis Date Noted  . Type II diabetes mellitus with neurological manifestations ([place]) 11/29/2013    Priority: High  . Diabetic neuropathy ([place]) 08/22/2015    Priority: Medium  . Depression 02/28/2015    Priority: Medium  . Dyslipidemia 11/29/2013    Priority: Medium  . Hypothyroidism 11/29/2013    Priority: Medium  . Essential hypertension 11/29/2013    Priority: Medium  . Former smoker 02/28/2015    Priority: Low  . History of breast cancer 11/30/2013    Priority: Low  . History of thyroid cancer 11/30/2013    Priority: Low  . Orthostatic hypotension 11/29/2013    Priority: Low    Medications- reviewed and updated Current Outpatient Prescriptions  Medication Sig Dispense Refill  . aspirin 81 MG tablet Take 81 mg by mouth daily.    Marland Kitchen CALCIUM-VITAMIN D PO Take by mouth 2 (two) times daily.    . citalopram (CELEXA) 40 MG tablet Take 1 tablet (40 mg total) by mouth daily. 90 tablet 3  . CRESTOR 10 MG tablet TAKE 1 TABLET DAILY 90 tablet 3  . levothyroxine (SYNTHROID, LEVOTHROID) 112 MCG tablet TAKE 1 TABLET DAILY 90 tablet 3  . metFORMIN (GLUCOPHAGE) 1000 MG tablet TAKE 1 TABLET DAILY WITH BREAKFAST 90 tablet 3  . nadolol (CORGARD) 40 MG tablet TAKE 1 TABLET DAILY 90 tablet 3  . Omega-3-acid Ethyl Esters (OMACOR PO) Take by mouth 2 (two) times daily.    . quinapril (ACCUPRIL) 10 MG tablet TAKE 1 TABLET DAILY 90 tablet 3  . Tdap (BOOSTRIX) 5-2.5-18.5 LF-MCG/0.5 injection Inject 0.5 mLs into the muscle once. 0.5 mL 0  . ZETIA 10 MG tablet TAKE 1 TABLET DAILY 90 tablet 3   No current facility-administered medications for this visit.     Objective: BP 120/68 (BP Location: Right Arm, Patient Position:  Sitting, Cuff Size: Normal)   Pulse 70   Temp 98.2 F (36.8 C) (Oral)   Wt 185 lb 12.8 oz (84.3 kg)   SpO2 97%   BMI 27.44 kg/m  Gen: NAD, resting comfortably CV: RRR no murmurs rubs or gallops Lungs: CTAB no crackles, wheeze, rhonchi Abdomen: soft/nontender including no suprapubic pain/nondistended/normal bowel sounds. No rebound or guarding.  Ext: no edema Skin: warm, dry, no rash Neuro: grossly normal, moves all extremities  Results for orders placed or performed in visit on 04/06/16 (from the past 24 hour(s))  POCT Urinalysis Dipstick (Automated)     Status: Abnormal   Collection Time: 04/06/16 11:37 AM  Result Value Ref Range   Color, UA yellow    Clarity, UA clear    Glucose, UA n    Bilirubin, UA 1+    Ketones, UA n    Spec Grav, UA 1.020    Blood, UA 3+    pH, UA 7.0    Protein, UA 3+    Urobilinogen, UA 1.0    Nitrite, UA positive    Leukocytes, UA large (3+) (A) Negative   Assessment/Plan:  Probable Urinary tract infection - Plan: POCT Urinalysis Dipstick (Automated), Urine culture S: patient states yesterday morning she noted she felt like she needed to go to the restroom more frequently and with more urgency. When she would pee she felt "  shivery" but denies dysuria. Mild fatigue.  A/P: UA concerning for UTI, treat with keflex for 7 days. Get culture given patient age.    Orders Placed This Encounter  Procedures  . Urine culture  . Flu vaccine HIGH DOSE PF  . POCT Urinalysis Dipstick (Automated)    Meds ordered this encounter  Medications  . cephALEXin (KEFLEX) 500 MG capsule    Sig: Take 1 capsule (500 mg total) by mouth 3 (three) times daily.    Dispense:  21 capsule    Refill:  0    Return precautions advised.  Garret Reddish, MD

## 2016-04-06 NOTE — Patient Instructions (Signed)
Have to give [place] some urine before you leave- thanks!  You are free to go after giving the urine and then we will call you whether we are going to start antibiotics today. If initial test negative- still send culture and we will know by Monday  High dose flu shot before you leave

## 2016-04-09 LAB — URINE CULTURE

## 2016-04-17 ENCOUNTER — Encounter: Payer: Self-pay | Admitting: Family Medicine

## 2016-04-17 ENCOUNTER — Ambulatory Visit (INDEPENDENT_AMBULATORY_CARE_PROVIDER_SITE_OTHER): Payer: Medicare Other | Admitting: Family Medicine

## 2016-04-17 VITALS — BP 110/82 | HR 80 | Temp 98.3°F | Wt 187.0 lb

## 2016-04-17 DIAGNOSIS — I10 Essential (primary) hypertension: Secondary | ICD-10-CM

## 2016-04-17 DIAGNOSIS — E785 Hyperlipidemia, unspecified: Secondary | ICD-10-CM

## 2016-04-17 DIAGNOSIS — R2689 Other abnormalities of gait and mobility: Secondary | ICD-10-CM

## 2016-04-17 DIAGNOSIS — E1149 Type 2 diabetes mellitus with other diabetic neurological complication: Secondary | ICD-10-CM | POA: Diagnosis not present

## 2016-04-17 DIAGNOSIS — E039 Hypothyroidism, unspecified: Secondary | ICD-10-CM

## 2016-04-17 DIAGNOSIS — R29818 Other symptoms and signs involving the nervous system: Secondary | ICD-10-CM

## 2016-04-17 DIAGNOSIS — E1142 Type 2 diabetes mellitus with diabetic polyneuropathy: Secondary | ICD-10-CM

## 2016-04-17 LAB — CBC
HEMATOCRIT: 41 % (ref 36.0–46.0)
HEMOGLOBIN: 13.8 g/dL (ref 12.0–15.0)
MCHC: 33.7 g/dL (ref 30.0–36.0)
MCV: 93.1 fl (ref 78.0–100.0)
PLATELETS: 217 10*3/uL (ref 150.0–400.0)
RBC: 4.4 Mil/uL (ref 3.87–5.11)
RDW: 13.8 % (ref 11.5–15.5)
WBC: 7.6 10*3/uL (ref 4.0–10.5)

## 2016-04-17 LAB — COMPREHENSIVE METABOLIC PANEL
ALK PHOS: 61 U/L (ref 39–117)
ALT: 18 U/L (ref 0–35)
AST: 20 U/L (ref 0–37)
Albumin: 3.9 g/dL (ref 3.5–5.2)
BUN: 14 mg/dL (ref 6–23)
CALCIUM: 9.6 mg/dL (ref 8.4–10.5)
CHLORIDE: 100 meq/L (ref 96–112)
CO2: 31 mEq/L (ref 19–32)
Creatinine, Ser: 0.75 mg/dL (ref 0.40–1.20)
GFR: 79.55 mL/min (ref >60.00)
Glucose, Bld: 115 mg/dL — ABNORMAL HIGH (ref 70–99)
POTASSIUM: 3.9 meq/L (ref 3.5–5.1)
Sodium: 140 mEq/L (ref 135–145)
TOTAL PROTEIN: 6.5 g/dL (ref 6.0–8.3)
Total Bilirubin: 1.1 mg/dL (ref 0.2–1.2)

## 2016-04-17 LAB — POC URINALSYSI DIPSTICK (AUTOMATED)
Bilirubin, UA: NEGATIVE
GLUCOSE UA: NEGATIVE
KETONES UA: NEGATIVE
Nitrite, UA: NEGATIVE
RBC UA: NEGATIVE
SPEC GRAV UA: 1.02
UROBILINOGEN UA: 0.2
pH, UA: 6.5

## 2016-04-17 LAB — HEMOGLOBIN A1C: HEMOGLOBIN A1C: 6.6 % — AB (ref 4.6–6.5)

## 2016-04-17 LAB — TSH: TSH: 0.32 u[IU]/mL — AB (ref 0.35–4.50)

## 2016-04-17 LAB — LDL CHOLESTEROL, DIRECT: Direct LDL: 45 mg/dL

## 2016-04-17 MED ORDER — TETANUS-DIPHTH-ACELL PERTUSSIS 5-2.5-18.5 LF-MCG/0.5 IM SUSP: 0.5000 mL | Freq: Once | INTRAMUSCULAR | 0 refills | Status: DC

## 2016-04-17 NOTE — Patient Instructions (Addendum)
Labs before you leave  Let me know physical therapist's office name and I can enter referral  Go see if tetanus shot is affordable at pharmacy since your dog scratches that leg so heavily at times. Call us when you have gotten this.   I would also like for you to sign up for an annual wellness visit our nurse Manuela Schwartz. This is a free benefit under medicare that may help [place] find additional ways to help you.

## 2016-04-17 NOTE — Assessment & Plan Note (Signed)
S: Lab Results  Component Value Date   TSH 0.32 (L) 04/17/2016   On thyroid medication-100 mcg levothyroxine ROS-No hair or nail changes. No heat/cold intolerance. No constipation or diarrhea. Denies shakiness or anxiety.  A/P: mild overtreatment but near goal- opted to keep patient on current dose and repeat at follow up

## 2016-04-17 NOTE — Assessment & Plan Note (Signed)
S: in past well controlled. On metformin 1000mg  daily without lows Lab Results  Component Value Date   HGBA1C 6.6 (H) 04/17/2016   HGBA1C 6.2 12/21/2015   HGBA1C 6.8 (H) 08/22/2015   A/P: remains controlled- continue current medications as long a sa1c under 7

## 2016-04-17 NOTE — Assessment & Plan Note (Addendum)
Known neuropathy- no sensation on bottom of feet on monofilament. Does not want medication for this at present. Monitor.  Balance issues S: patient admits to balance issues at times. Neuropathy could be contributing A/P: refer to PT when patient gets me the name of practice she wants to go to- [name] is PT next door neighbor but she will reach out to me about practice name and will refer

## 2016-04-17 NOTE — Assessment & Plan Note (Signed)
S: well controlled on zetia 10mg , crestor 10mg . No myalgias.  Lab Results  Component Value Date   CHOL 84 12/09/2014   HDL 33.20 (L) 12/09/2014   LDLCALC 30 12/09/2014   LDLDIRECT 45.0 04/17/2016   TRIG 106.0 12/09/2014   CHOLHDL 3 12/09/2014   A/P: LDL 45- continue current medications

## 2016-04-17 NOTE — Progress Notes (Signed)
Pre visit review using our clinic review tool, if applicable. No additional management support is needed unless otherwise documented below in the visit note. 

## 2016-04-17 NOTE — Progress Notes (Addendum)
Subjective:  Kim Singh is a 75 y.o. year old very pleasant female patient who presents for/with See problem oriented charting ROS- No chest pain or shortness of breath. No headache or blurry vision.  Does have some balance issues.see any ROS included in HPI as well.   Past Medical History-  Patient Active Problem List   Diagnosis Date Noted  . Type II diabetes mellitus with neurological manifestations ([place]) 11/29/2013    Priority: High  . Diabetic neuropathy ([place]) 08/22/2015    Priority: Medium  . Depression 02/28/2015    Priority: Medium  . Dyslipidemia 11/29/2013    Priority: Medium  . Hypothyroidism 11/29/2013    Priority: Medium  . Essential hypertension 11/29/2013    Priority: Medium  . Former smoker 02/28/2015    Priority: Low  . History of breast cancer 11/30/2013    Priority: Low  . History of thyroid cancer 11/30/2013    Priority: Low  . Orthostatic hypotension 11/29/2013    Priority: Low    Medications- reviewed and updated Current Outpatient Prescriptions  Medication Sig Dispense Refill  . aspirin 81 MG tablet Take 81 mg by mouth daily.    Marland Kitchen CALCIUM-VITAMIN D PO Take by mouth 2 (two) times daily.    . citalopram (CELEXA) 40 MG tablet Take 1 tablet (40 mg total) by mouth daily. 90 tablet 3  . CRESTOR 10 MG tablet TAKE 1 TABLET DAILY 90 tablet 3  . levothyroxine (SYNTHROID, LEVOTHROID) 112 MCG tablet TAKE 1 TABLET DAILY 90 tablet 3  . metFORMIN (GLUCOPHAGE) 1000 MG tablet TAKE 1 TABLET DAILY WITH BREAKFAST 90 tablet 3  . nadolol (CORGARD) 40 MG tablet TAKE 1 TABLET DAILY 90 tablet 3  . Omega-3-acid Ethyl Esters (OMACOR PO) Take by mouth 2 (two) times daily.    . quinapril (ACCUPRIL) 10 MG tablet TAKE 1 TABLET DAILY 90 tablet 3  . ZETIA 10 MG tablet TAKE 1 TABLET DAILY 90 tablet 3   No current facility-administered medications for this visit.     Objective: BP 110/82 (BP Location: Right Arm, Patient Position: Sitting, Cuff Size: Large)   Pulse 80   Temp  98.3 F (36.8 C) (Oral)   Wt 187 lb (84.8 kg)   SpO2 99%   BMI 27.62 kg/m  Gen: NAD, resting comfortably CV: RRR no murmurs rubs or gallops Lungs: CTAB no crackles, wheeze, rhonchi Ext: no edema Skin: warm, dry Neuro: normal speech  Diabetic Foot Exam - Simple   Simple Foot Form Diabetic Foot exam was performed with the following findings:  Yes 04/17/2016 10:32 PM  Visual Inspection No deformities, no ulcerations, no other skin breakdown bilaterally:  Yes Sensation Testing See comments:  Yes Pulse Check Posterior Tibialis and Dorsalis pulse intact bilaterally:  Yes Comments No sensation to monofilament on bottom of feet     Assessment/Plan:  Hypothyroidism S: Lab Results  Component Value Date   TSH 0.32 (L) 04/17/2016   On thyroid medication-100 mcg levothyroxine ROS-No hair or nail changes. No heat/cold intolerance. No constipation or diarrhea. Denies shakiness or anxiety.  A/P: mild overtreatment but near goal- opted to keep patient on current dose and repeat at follow up   Type II diabetes mellitus with neurological manifestations ([place]) S: in past well controlled. On metformin 1000mg  daily without lows Lab Results  Component Value Date   HGBA1C 6.6 (H) 04/17/2016   HGBA1C 6.2 12/21/2015   HGBA1C 6.8 (H) 08/22/2015   A/P: remains controlled- continue current medications as long a sa1c under 7  Dyslipidemia S: well controlled on zetia 10mg , crestor 10mg . No myalgias.  Lab Results  Component Value Date   CHOL 84 12/09/2014   HDL 33.20 (L) 12/09/2014   LDLCALC 30 12/09/2014   LDLDIRECT 45.0 04/17/2016   TRIG 106.0 12/09/2014   CHOLHDL 3 12/09/2014   A/P: LDL 45- continue current medications   Diabetic neuropathy (HCC) Known neuropathy- no sensation on bottom of feet on monofilament. Does not want medication for this at present. Monitor.  Balance issues S: patient admits to balance issues at times. Neuropathy could be contributing A/P: refer to PT when  patient gets me the name of practice she wants to go to- [name] is PT next door neighbor but she will reach out to me about practice name and will refer  Hypertension S: controlled on nadolol 40mg  and quinapril 10mg  BP Readings from Last 3 Encounters:  04/17/16 110/82  04/06/16 120/68  12/21/15 108/74  A/P:Continue current medications  6 months  Orders Placed This Encounter  Procedures  . CBC    [place]  . Comprehensive metabolic panel    [place]  . Hemoglobin A1c    [place]  . TSH    [place]  . LDL cholesterol, direct      . Ambulatory referral to Physical Therapy    Referral Priority:   Routine    Referral Type:   Physical Medicine    Referral Reason:   Specialty Services Required    Requested Specialty:   Physical Therapy    Number of Visits Requested:   1  . POCT Urinalysis Dipstick (Automated)    Meds ordered this encounter  Medications  . DISCONTD: Tdap (BOOSTRIX) 5-2.5-18.5 LF-MCG/0.5 injection    Sig: Inject 0.5 mLs into the muscle once.    Dispense:  0.5 mL    Refill:  0    Return precautions advised.  Garret Reddish, MD

## 2016-04-20 NOTE — Addendum Note (Signed)
Addended by: Marin Olp on: 04/20/2016 01:12 PM   Modules accepted: Orders

## 2016-05-08 ENCOUNTER — Ambulatory Visit: Payer: Medicare Other | Attending: Family Medicine | Admitting: Physical Therapy

## 2016-05-08 ENCOUNTER — Ambulatory Visit: Payer: Medicare Other

## 2016-05-08 VITALS — BP 110/70 | HR 66 | Ht 68.0 in | Wt 186.0 lb

## 2016-05-08 DIAGNOSIS — R2689 Other abnormalities of gait and mobility: Secondary | ICD-10-CM | POA: Insufficient documentation

## 2016-05-08 DIAGNOSIS — R2681 Unsteadiness on feet: Secondary | ICD-10-CM | POA: Diagnosis not present

## 2016-05-08 DIAGNOSIS — Z0000 Encounter for general adult medical examination without abnormal findings: Secondary | ICD-10-CM
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
## 2016-05-08 NOTE — Progress Notes (Signed)
I have reviewed and agree with note, evaluation, plan. Would be thrilled if would start exercise regimen  Garret Reddish, MD

## 2016-05-08 NOTE — Patient Instructions (Addendum)
Kim Singh , Thank you for taking time to come for your Medicare Wellness Visit. I appreciate your ongoing commitment to your health goals. Please review the following plan we discussed and let me know if I can assist you in the future.   Information on Magnesium;  https://ods.od.nih.gov/factsheets/Magnesium-HealthProfessional/#h9 You can google magnesium at the Institutes of health                                                                                                                                                                                                                                                                                                                                                                                                                                                                                                                                                                                                          Will continue with PT Please ask the PT to work with you on a fitness program that is individualized to meet your needs Explore other things;  Try water aerobics   Will call [place] practice and confirm if you have had the zostervax; (shingles)  Educated to check with insurance regarding coverage of Shingles vaccination on Part D or Part B and may have lower co-pay if provided on the Part D side   These are the goals we discussed: Goals    . Exercise 150 minutes per week (moderate activity)          Will continue with PT  Will develop a personal plan of exercise that meets your needs;  Older adults aged 65 or older, who are generally fit and have no health conditions that limit their mobility, should try to be active daily and should do: at least 150 minutes of moderate aerobic activity such as cycling or walking every week, and  strength exercises on two or more days a week that work all the major muscles (legs, hips, back, abdomen,  chest, shoulders and arms).      OR  75 minutes of vigorous aerobic activity such as running or a game of singles tennis every week, and  strength exercises on two or more days a week that work all the major muscles (legs, hips, back, abdomen, chest, shoulders and arms).     OR  a mix of moderate and vigorous aerobic activity every week. For example, two 30-minute runs, plus 30 minutes of fast walking, equates to 150 minutes of moderate aerobic activity, and  strength exercises on two or more days a week that work all the major muscles (legs, hips, back, abdomen, chest, shoulders and arms).  A rule of thumb is that one minute of vigorous activity provides the same health benefits as two minutes of moderate activity. You should also try to break up long periods of sitting with light activity, as sedentary behaviour is now considered an independent risk factor for ill health, no matter how much exercise you do. Find out why sitting is bad for your health. Older adults at risk of falls, such as people with weak legs, poor balance and some medical conditions, should do exercises to improve balance and co-ordination on at least two days a week. Examples include yoga, tai chi and dancing.         This is a list of the screening recommended for you and due dates:  Health Maintenance  Topic Date Due  . Shingles Vaccine  03/08/2054*  . Hemoglobin A1C  10/15/2016  . Eye exam for diabetics  11/07/2016  . Complete foot exam   04/17/2017  . Tetanus Vaccine  04/17/2026  . Flu Shot  Completed  . DEXA scan (bone density measurement)  Completed  . Pneumonia vaccines  Completed  *Topic was postponed. The date shown is not the original due date.       Fall Prevention in the Home  Falls can cause injuries. They can happen to people of all ages. There are many things you can do to make your home safe and to help prevent falls.  WHAT CAN I DO ON THE OUTSIDE OF MY HOME?  Regularly fix the edges of  walkways and driveways and fix any cracks.  Remove anything that might make you trip as you walk through a door, such as a raised step or threshold.  Trim any bushes or trees on the path to your home.    Use bright outdoor lighting.  Clear any walking paths of anything that might make someone trip, such as rocks or tools.  Regularly check to see if handrails are loose or broken. Make sure that both sides of any steps have handrails.  Any raised decks and porches should have guardrails on the edges.  Have any leaves, snow, or ice cleared regularly.  Use sand or salt on walking paths during winter.  Clean up any spills in your garage right away. This includes oil or grease spills. WHAT CAN I DO IN THE BATHROOM?   Use night lights.  Install grab bars by the toilet and in the tub and shower. Do not use towel bars as grab bars.  Use non-skid mats or decals in the tub or shower.  If you need to sit down in the shower, use a plastic, non-slip stool.  Keep the floor dry. Clean up any water that spills on the floor as soon as it happens.  Remove soap buildup in the tub or shower regularly.  Attach bath mats securely with double-sided non-slip rug tape.  Do not have throw rugs and other things on the floor that can make you trip. WHAT CAN I DO IN THE BEDROOM?  Use night lights.  Make sure that you have a light by your bed that is easy to reach.  Do not use any sheets or blankets that are too big for your bed. They should not hang down onto the floor.  Have a firm chair that has side arms. You can use this for support while you get dressed.  Do not have throw rugs and other things on the floor that can make you trip. WHAT CAN I DO IN THE KITCHEN?  Clean up any spills right away.  Avoid walking on wet floors.  Keep items that you use a lot in easy-to-reach places.  If you need to reach something above you, use a strong step stool that has a grab bar.  Keep electrical cords  out of the way.  Do not use floor polish or wax that makes floors slippery. If you must use wax, use non-skid floor wax.  Do not have throw rugs and other things on the floor that can make you trip. WHAT CAN I DO WITH MY STAIRS?  Do not leave any items on the stairs.  Make sure that there are handrails on both sides of the stairs and use them. Fix handrails that are broken or loose. Make sure that handrails are as long as the stairways.  Check any carpeting to make sure that it is firmly attached to the stairs. Fix any carpet that is loose or worn.  Avoid having throw rugs at the top or bottom of the stairs. If you do have throw rugs, attach them to the floor with carpet tape.  Make sure that you have a light switch at the top of the stairs and the bottom of the stairs. If you do not have them, ask someone to add them for you. WHAT ELSE CAN I DO TO HELP PREVENT FALLS?  Wear shoes that:  Do not have high heels.  Have rubber bottoms.  Are comfortable and fit you well.  Are closed at the toe. Do not wear sandals.  If you use a stepladder:  Make sure that it is fully opened. Do not climb a closed stepladder.  Make sure that both sides of the stepladder are locked into place.  Ask someone to hold it for  you, if possible.  Clearly mark and make sure that you can see:  Any grab bars or handrails.  First and last steps.  Where the edge of each step is.  Use tools that help you move around (mobility aids) if they are needed. These include:  Canes.  Walkers.  Scooters.  Crutches.  Turn on the lights when you go into a dark area. Replace any light bulbs as soon as they burn out.  Set up your furniture so you have a clear path. Avoid moving your furniture around.  If any of your floors are uneven, fix them.  If there are any pets around you, be aware of where they are.  Review your medicines with your doctor. Some medicines can make you feel dizzy. This can increase  your chance of falling. Ask your doctor what other things that you can do to help prevent falls.   This information is not intended to replace advice given to you by your health care provider. Make sure you discuss any questions you have with your health care provider.   Document Released: 05/05/2009 Document Revised: 11/23/2014 Document Reviewed: 08/13/2014 Elsevier Interactive Patient Education 2016 [place] Maintenance, Female Adopting a healthy lifestyle and getting preventive care can go a long way to promote health and wellness. Talk with your health care provider about what schedule of regular examinations is right for you. This is a good chance for you to check in with your provider about disease prevention and staying healthy. In between checkups, there are plenty of things you can do on your own. Experts have done a lot of research about which lifestyle changes and preventive measures are most likely to keep you healthy. Ask your health care provider for more information. WEIGHT AND DIET  Eat a healthy diet  Be sure to include plenty of vegetables, fruits, low-fat dairy products, and lean protein.  Do not eat a lot of foods high in solid fats, added sugars, or salt.  Get regular exercise. This is one of the most important things you can do for your health.  Most adults should exercise for at least 150 minutes each week. The exercise should increase your heart rate and make you sweat (moderate-intensity exercise).  Most adults should also do strengthening exercises at least twice a week. This is in addition to the moderate-intensity exercise.  Maintain a healthy weight  Body mass index (BMI) is a measurement that can be used to identify possible weight problems. It estimates body fat based on height and weight. Your health care provider can help determine your BMI and help you achieve or maintain a healthy weight.  For females 53 years of age and older:   A BMI  below 18.5 is considered underweight.  A BMI of 18.5 to 24.9 is normal.  A BMI of 25 to 29.9 is considered overweight.  A BMI of 30 and above is considered obese.  Watch levels of cholesterol and blood lipids  You should start having your blood tested for lipids and cholesterol at 77 years of age, then have this test every 5 years.  You may need to have your cholesterol levels checked more often if:  Your lipid or cholesterol levels are high.  You are older than 77 years of age.  You are at high risk for heart disease.  CANCER SCREENING   Lung Cancer  Lung cancer screening is recommended for adults 79-40 years old who are at high risk for lung cancer  because of a history of smoking.  A yearly low-dose CT scan of the lungs is recommended for people who:  Currently smoke.  Have quit within the past 15 years.  Have at least a 30-pack-year history of smoking. A pack year is smoking an average of one pack of cigarettes a day for 1 year.  Yearly screening should continue until it has been 15 years since you quit.  Yearly screening should stop if you develop a health problem that would prevent you from having lung cancer treatment.  Breast Cancer  Practice breast self-awareness. This means understanding how your breasts normally appear and feel.  It also means doing regular breast self-exams. Let your health care provider know about any changes, no matter how small.  If you are in your 20s or 30s, you should have a clinical breast exam (CBE) by a health care provider every 1-3 years as part of a regular health exam.  If you are 40 or older, have a CBE every year. Also consider having a breast X-ray (mammogram) every year.  If you have a family history of breast cancer, talk to your health care provider about genetic screening.  If you are at high risk for breast cancer, talk to your health care provider about having an MRI and a mammogram every year.  Breast cancer gene  (BRCA) assessment is recommended for women who have family members with BRCA-related cancers. BRCA-related cancers include:  Breast.  Ovarian.  Tubal.  Peritoneal cancers.  Results of the assessment will determine the need for genetic counseling and BRCA1 and BRCA2 testing. Cervical Cancer Your health care provider may recommend that you be screened regularly for cancer of the pelvic organs (ovaries, uterus, and vagina). This screening involves a pelvic examination, including checking for microscopic changes to the surface of your cervix (Pap test). You may be encouraged to have this screening done every 3 years, beginning at age 21.  For women ages 30-65, health care providers may recommend pelvic exams and Pap testing every 3 years, or they may recommend the Pap and pelvic exam, combined with testing for human papilloma virus (HPV), every 5 years. Some types of HPV increase your risk of cervical cancer. Testing for HPV may also be done on women of any age with unclear Pap test results.  Other health care providers may not recommend any screening for nonpregnant women who are considered low risk for pelvic cancer and who do not have symptoms. Ask your health care provider if a screening pelvic exam is right for you.  If you have had past treatment for cervical cancer or a condition that could lead to cancer, you need Pap tests and screening for cancer for at least 20 years after your treatment. If Pap tests have been discontinued, your risk factors (such as having a new sexual partner) need to be reassessed to determine if screening should resume. Some women have medical problems that increase the chance of getting cervical cancer. In these cases, your health care provider may recommend more frequent screening and Pap tests. Colorectal Cancer  This type of cancer can be detected and often prevented.  Routine colorectal cancer screening usually begins at 77 years of age and continues through  77 years of age.  Your health care provider may recommend screening at an earlier age if you have risk factors for colon cancer.  Your health care provider may also recommend using home test kits to check for hidden blood in the stool.  A   small camera at the end of a tube can be used to examine your colon directly (sigmoidoscopy or colonoscopy). This is done to check for the earliest forms of colorectal cancer.  Routine screening usually begins at age 50.  Direct examination of the colon should be repeated every 5-10 years through 77 years of age. However, you may need to be screened more often if early forms of precancerous polyps or small growths are found. Skin Cancer  Check your skin from head to toe regularly.  Tell your health care provider about any new moles or changes in moles, especially if there is a change in a mole's shape or color.  Also tell your health care provider if you have a mole that is larger than the size of a pencil eraser.  Always use sunscreen. Apply sunscreen liberally and repeatedly throughout the day.  Protect yourself by wearing long sleeves, pants, a wide-brimmed hat, and sunglasses whenever you are outside. HEART DISEASE, DIABETES, AND HIGH BLOOD PRESSURE   High blood pressure causes heart disease and increases the risk of stroke. High blood pressure is more likely to develop in:  People who have blood pressure in the high end of the normal range (130-139/85-89 mm Hg).  People who are overweight or obese.  People who are African American.  If you are 18-39 years of age, have your blood pressure checked every 3-5 years. If you are 40 years of age or older, have your blood pressure checked every year. You should have your blood pressure measured twice--once when you are at a hospital or clinic, and once when you are not at a hospital or clinic. Record the average of the two measurements. To check your blood pressure when you are not at a hospital or  clinic, you can use:  An automated blood pressure machine at a pharmacy.  A home blood pressure monitor.  If you are between 55 years and 79 years old, ask your health care provider if you should take aspirin to prevent strokes.  Have regular diabetes screenings. This involves taking a blood sample to check your fasting blood sugar level.  If you are at a normal weight and have a low risk for diabetes, have this test once every three years after 77 years of age.  If you are overweight and have a high risk for diabetes, consider being tested at a younger age or more often. PREVENTING INFECTION  Hepatitis B  If you have a higher risk for hepatitis B, you should be screened for this virus. You are considered at high risk for hepatitis B if:  You were born in a country where hepatitis B is common. Ask your health care provider which countries are considered high risk.  Your parents were born in a high-risk country, and you have not been immunized against hepatitis B (hepatitis B vaccine).  You have HIV or AIDS.  You use needles to inject street drugs.  You live with someone who has hepatitis B.  You have had sex with someone who has hepatitis B.  You get hemodialysis treatment.  You take certain medicines for conditions, including cancer, organ transplantation, and autoimmune conditions. Hepatitis C  Blood testing is recommended for:  Everyone born from 1945 through 1965.  Anyone with known risk factors for hepatitis C. Sexually transmitted infections (STIs)  You should be screened for sexually transmitted infections (STIs) including gonorrhea and chlamydia if:  You are sexually active and are younger than 77 years of age.    You are older than 77 years of age and your health care provider tells you that you are at risk for this type of infection.  Your sexual activity has changed since you were last screened and you are at an increased risk for chlamydia or gonorrhea. Ask  your health care provider if you are at risk.  If you do not have HIV, but are at risk, it may be recommended that you take a prescription medicine daily to prevent HIV infection. This is called pre-exposure prophylaxis (PrEP). You are considered at risk if:  You are sexually active and do not regularly use condoms or know the HIV status of your partner(s).  You take drugs by injection.  You are sexually active with a partner who has HIV. Talk with your health care provider about whether you are at high risk of being infected with HIV. If you choose to begin PrEP, you should first be tested for HIV. You should then be tested every 3 months for as long as you are taking PrEP.  PREGNANCY   If you are premenopausal and you may become pregnant, ask your health care provider about preconception counseling.  If you may become pregnant, take 400 to 800 micrograms (mcg) of folic acid every day.  If you want to prevent pregnancy, talk to your health care provider about birth control (contraception). OSTEOPOROSIS AND MENOPAUSE   Osteoporosis is a disease in which the bones lose minerals and strength with aging. This can result in serious bone fractures. Your risk for osteoporosis can be identified using a bone density scan.  If you are 58 years of age or older, or if you are at risk for osteoporosis and fractures, ask your health care provider if you should be screened.  Ask your health care provider whether you should take a calcium or vitamin D supplement to lower your risk for osteoporosis.  Menopause may have certain physical symptoms and risks.  Hormone replacement therapy may reduce some of these symptoms and risks. Talk to your health care provider about whether hormone replacement therapy is right for you.  HOME CARE INSTRUCTIONS   Schedule regular health, dental, and eye exams.  Stay current with your immunizations.   Do not use any tobacco products including cigarettes, chewing  tobacco, or electronic cigarettes.  If you are pregnant, do not drink alcohol.  If you are breastfeeding, limit how much and how often you drink alcohol.  Limit alcohol intake to no more than 1 drink per day for nonpregnant women. One drink equals 12 ounces of beer, 5 ounces of wine, or 1 ounces of hard liquor.  Do not use street drugs.  Do not share needles.  Ask your health care provider for help if you need support or information about quitting drugs.  Tell your health care provider if you often feel depressed.  Tell your health care provider if you have ever been abused or do not feel safe at home.   This information is not intended to replace advice given to you by your health care provider. Make sure you discuss any questions you have with your health care provider.   Document Released: 01/22/2011 Document Revised: 07/30/2014 Document Reviewed: 06/10/2013 Elsevier Interactive Patient Education [employer].

## 2016-05-08 NOTE — Patient Instructions (Addendum)
SINGLE LIMB STANCE    Stance: single leg on floor. Raise leg. Hold _10__ seconds. Repeat with other leg. _2__ reps per set, __2_ sets per day, __5_ days per week  Copyright  VHI. All rights reserved.  Heel Raise: Unilateral (Standing)    Balance on left foot, then rise on ball of foot. Repeat _10__ times per set. Do __1__ sets per session. Do _2-3___ sessions per day.  http://orth.exer.us/41   Copyright  VHI. All rights reserved.  Toe / Heel Raise (Standing)    Standing with support, raise heels, then rock back on heels and raise toes. Repeat __10-15_ times.  Copyright  VHI. All rights reserved.

## 2016-05-09 NOTE — Therapy (Signed)
[facility] [address] [place], [place], [address] Phone: [phone]   Fax:  [phone]  Physical Therapy Evaluation  Patient Details  Name: Kim Singh MRN: [id number] Date of Birth: 1941-05-27 Referring Provider: Garret Reddish, MD  Encounter Date: 05/08/2016      PT End of Session - 05/09/16 0919    Visit Number 1   Number of Visits 9   Date for PT Re-Evaluation 06/08/16   Authorization Type Medicare   Authorization Time Period 05-08-16 - 07-07-16   PT Start Time 0802   PT Stop Time 0850   PT Time Calculation (min) 48 min      Past Medical History:  Diagnosis Date  . Diabetes mellitus type 2, uncomplicated ([place])   . Pure hypercholesterolemia   . Unspecified disorder of thyroid   . Unspecified essential hypertension     Past Surgical History:  Procedure Laterality Date  . BREAST LUMPECTOMY     L  . THYROIDECTOMY     total    There were no vitals filed for this visit.       Subjective Assessment - 05/09/16 0913    Subjective Pt reports she has had some little falls "nothing big" resulting in a scare for her; reports some numbness and tingling in her legs due to neuropathy; pt reports she "has gotten lazy since moving here 2 1/2 years ago"   Pertinent History Type II DM with neurological manifestations: Neuropathy in feet:  h/o orthostatic hypotension:  h/o breast cancer and thyroid cancer:  HTN   Patient Stated Goals "wants to feel good": wants to comb hair without arms getting tired            [facility] PT Assessment - 05/08/16 0813      Assessment   Medical Diagnosis Gait abnormality   Referring Provider Garret Reddish, MD   Onset Date/Surgical Date --  approx. 2 years ago     Balance Screen   Has the patient fallen in the past 6 months Yes   How many times? 1   Has the patient had a decrease in activity level because of a fear of falling?  Yes   Is the patient reluctant to leave  their home because of a fear of falling?  No     Home Environment   Living Environment Private residence   Type of [place] Access Stairs to enter   Entrance Stairs-Number of Steps 2   Home Layout Two level     Prior Function   Level of Independence Independent     TUG score 7.97 secs  Gait velocity 4.67 ft/sec (time 7.03 secs) (WNL's for gait speed): gait pattern WNL's with no device used/needed  Single limb stance;  RLE >7.79 secs:  LLE 4.72 secs on 3rd trial Tandem stance 30 secs both positions  Manual muscle test:  WNL's bil. LE's except for muscle groups listed below:  R hip flexors 4/5;  L 5/5  R hip abductors 4/5:  L 3+/5  Bil. Hip extensors 3+/5                     PT Education - 05/09/16 0918    Education provided Yes   Education Details HEP initiated - heel raises, SLS and ankle sways   Person(s) Educated Patient   Methods Explanation;Demonstration;Handout   Comprehension Verbalized understanding;Returned demonstration             PT Long  Term Goals - 05/09/16 0927      PT LONG TERM GOAL #1   Title Increase strength in bil. LE's so pt able to perform sit to stand 10x from 18" mat surface without UE support.  (06-08-16)   Baseline able to perform 1 rep without UE support on 2016-05-09 during eval   Time 4   Period Weeks   Status New     PT LONG TERM GOAL #2   Title Increase high level balance skills so pt able to perform L SLS >/= 8 secs.  (06-08-16)   Baseline L SLS = 4.72 secs   R= 7.79 secs   Time 4   Period Weeks   Status New     PT LONG TERM GOAL #3   Title Pt will report at least 50% improvement in step negotiation at home - including going up/down steps at least 3-4 times/day for exercise.  (06-08-16)   Time 4   Period Weeks   Status New     PT LONG TERM GOAL #4   Title Perform FGA and establish goal as appropriate.  (06-08-16)   Time 4   Period Weeks   Status New     PT LONG TERM GOAL #5   Title  Independent in HEP for balance and strengthening.  (06-08-16)   Time 4   Period Weeks   Status New     Additional Long Term Goals   Additional Long Term Goals Yes     PT LONG TERM GOAL #6   Title Increase ABC score (in FOTO) to >/= 70% to demo increased confidence with ambulation/mobility.  (06-08-16)   Baseline 56.3%   Time 4   Period Weeks   Status New               Plan - 05/09/16 0920    Clinical Impression Statement Pt is a 75 year old lady with c/o balance deficits due to decreased activity during past 2 years.  Pt moved from [place] to [place] to live with her daughter and has had lifestyle changes due to lack of social network and some depression (reports husband died 8 years ago and they wnet to gym together).  She reports a couple of falls and some mild fear of falling.  Pt presents with proximal weakness and some decreased high level balance skills.  PMH includes HTN , Type II DM, neuropathy in both feet, h/o orthostatic hypotension and h/o breast and thyroid cancer.   Rehab Potential Good   PT Frequency 2x / week   PT Duration 4 weeks   PT Treatment/Interventions ADLs/Self Care Home Management;Gait training;Stair training;Therapeutic activities;Therapeutic exercise;Balance training;Neuromuscular re-education;Patient/family education   PT Next Visit Plan add hip extension strengthening and hip abduction (L) to HEP; do FGA; check HEP given 05/09/16   PT Home Exercise Plan see above   Recommended Other Services community fitness options upon D/C   Consulted and Agree with Plan of Care Patient      Patient will benefit from skilled therapeutic intervention in order to improve the following deficits and impairments:  Difficulty walking, Decreased endurance, Decreased balance, Decreased strength  Visit Diagnosis: Other abnormalities of gait and mobility - Plan: PT plan of care cert/re-cert  Unsteadiness on feet - Plan: PT plan of care cert/re-cert      G-Codes -  2016-05-09 0941    Functional Assessment Tool Used ABC score 56.3%:  TUG score 7.97 secs:  Gait velocity 4.67 ft/sec   Functional Limitation Mobility:  Walking and moving around   Mobility: Walking and Moving Around Current Status [phone]) At least 20 percent but less than 40 percent impaired, limited or restricted   Mobility: Walking and Moving Around Goal Status [phone]) At least 1 percent but less than 20 percent impaired, limited or restricted       Problem List Patient Active Problem List   Diagnosis Date Noted  . Diabetic neuropathy ([place]) 08/22/2015  . Depression 02/28/2015  . Former smoker 02/28/2015  . History of breast cancer 11/30/2013  . History of thyroid cancer 11/30/2013  . Orthostatic hypotension 11/29/2013  . Dyslipidemia 11/29/2013  . Hypothyroidism 11/29/2013  . Type II diabetes mellitus with neurological manifestations ([place]) 11/29/2013  . Essential hypertension 11/29/2013    Alda Lea, PT 05/09/2016, 9:52 AM  [facility] [address] [place], [place], [address] Phone: [phone]   Fax:  [phone]  Name: Kim Singh MRN: [id number] Date of Birth: 1941/01/29

## 2016-05-31 ENCOUNTER — Ambulatory Visit: Payer: Medicare Other | Attending: Family Medicine | Admitting: Physical Therapy

## 2016-05-31 DIAGNOSIS — R2689 Other abnormalities of gait and mobility: Secondary | ICD-10-CM | POA: Insufficient documentation

## 2016-05-31 DIAGNOSIS — R2681 Unsteadiness on feet: Secondary | ICD-10-CM | POA: Insufficient documentation

## 2016-06-01 NOTE — Patient Instructions (Signed)
Added theraband kicks for hip strengthening with red theraband - extension and abduction  Sheet with stretches for heel cord and hamstring  1

## 2016-06-01 NOTE — Therapy (Signed)
[facility] [address] [place], [place], [address] Phone: [phone]   Fax:  [phone]  Physical Therapy Treatment  Patient Details  Name: Kim Singh MRN: [id number] Date of Birth: [birth year] Referring Provider: Garret Reddish, MD  Encounter Date: 05/31/2016      PT End of Session - 06/01/16 1023    Visit Number 2   Number of Visits 9   Date for PT Re-Evaluation 06/08/16   Authorization Type Medicare   Authorization Time Period 05-08-16 - 07-07-16   PT Start Time 0935   PT Stop Time 1017   PT Time Calculation (min) 42 min      Past Medical History:  Diagnosis Date  . Diabetes mellitus type 2, uncomplicated ([place])   . Pure hypercholesterolemia   . Unspecified disorder of thyroid   . Unspecified essential hypertension     Past Surgical History:  Procedure Laterality Date  . BREAST LUMPECTOMY     L  . THYROIDECTOMY     total    There were no vitals filed for this visit.      Subjective Assessment - 06/01/16 1015    Subjective Pt reports she has not been doing exercises at home - thinks that she will be more compliant if she goes to gym/possibly [employer] for group exercise   Pertinent History Type II DM with neurological manifestations: Neuropathy in feet:  h/o orthostatic hypotension:  h/o breast cancer and thyroid cancer:  HTN   Patient Stated Goals "wants to feel good": wants to comb hair without arms getting tired   Currently in Pain? No/denies                         [facility] Adult PT Treatment/Exercise - 06/01/16 0001      Exercises   Exercises Lumbar;Knee/Hip;Ankle     Lumbar Exercises: Stretches   Active Hamstring Stretch 1 rep;30 seconds     Lumbar Exercises: Machines for Strengthening   Leg Press 60# bil. LE's 3 sets 10 reps     Lumbar Exercises: Standing   Heel Raises 10 reps   Other Standing Lumbar Exercises Pt performed bil. hip extension and abduction  stregnthening exercises with red theraband x 10 reps each in standing     Knee/Hip Exercises: Stretches   Gastroc Stretch Both;1 rep;30 seconds     NeuroRe-ed:  Reviewed HEP of SLS on each leg - pt performed SLS on each leg 10 secs with UE support prn Pt performed slow tap ups to 2nd step for SLS activity Tandem stance 30 secs each position; pt then performed tandem walking 25' with CGA x 2 reps           PT Education - 06/01/16 1022    Education provided Yes   Education Details added theraband (red) for hip extension and abduction   Person(s) Educated Patient   Methods Explanation;Demonstration;Handout   Comprehension Verbalized understanding;Returned demonstration             PT Long Term Goals - 05/09/16 0927      PT LONG TERM GOAL #1   Title Increase strength in bil. LE's so pt able to perform sit to stand 10x from 18" mat surface without UE support.  (06-08-16)   Baseline able to perform 1 rep without UE support on 05-08-16 during eval   Time 4   Period Weeks   Status New     PT LONG TERM GOAL #2  Title Increase high level balance skills so pt able to perform L SLS >/= 8 secs.  (06-08-16)   Baseline L SLS = 4.72 secs   R= 7.79 secs   Time 4   Period Weeks   Status New     PT LONG TERM GOAL #3   Title Pt will report at least 50% improvement in step negotiation at home - including going up/down steps at least 3-4 times/day for exercise.  (06-08-16)   Time 4   Period Weeks   Status New     PT LONG TERM GOAL #4   Title Perform FGA and establish goal as appropriate.  (06-08-16)   Time 4   Period Weeks   Status New     PT LONG TERM GOAL #5   Title Independent in HEP for balance and strengthening.  (06-08-16)   Time 4   Period Weeks   Status New     Additional Long Term Goals   Additional Long Term Goals Yes     PT LONG TERM GOAL #6   Title Increase ABC score (in FOTO) to >/= 70% to demo increased confidence with ambulation/mobility.  (06-08-16)    Baseline 56.3%   Time 4   Period Weeks   Status New               Plan - 06/01/16 1024    Clinical Impression Statement Pt presents with bil. hip extensor weakness and decreased high level balance skills - pt is doing well functionally overall:  compliance has been minimal per pt report, therefore, will pursue options for participation in community exercise program   Rehab Potential Good   PT Frequency 2x / week   PT Duration 4 weeks   PT Treatment/Interventions ADLs/Self Care Home Management;Gait training;Stair training;Therapeutic activities;Therapeutic exercise;Balance training;Neuromuscular re-education;Patient/family education   PT Next Visit Plan do FGA:  check theraband exercises (hip) added to HEP   PT Home Exercise Plan see above   Recommended Other Services [employer] for exercise program/water exercise class   Consulted and Agree with Plan of Care Patient      Patient will benefit from skilled therapeutic intervention in order to improve the following deficits and impairments:  Difficulty walking, Decreased endurance, Decreased balance, Decreased strength  Visit Diagnosis: Other abnormalities of gait and mobility  Unsteadiness on feet     Problem List Patient Active Problem List   Diagnosis Date Noted  . Diabetic neuropathy ([place]) 08/22/2015  . Depression 02/28/2015  . Former smoker 02/28/2015  . History of breast cancer 11/30/2013  . History of thyroid cancer 11/30/2013  . Orthostatic hypotension 11/29/2013  . Dyslipidemia 11/29/2013  . Hypothyroidism 11/29/2013  . Type II diabetes mellitus with neurological manifestations ([place]) 11/29/2013  . Essential hypertension 11/29/2013    Alda Lea, PT 06/01/2016, 10:30 AM  [facility] [address] [place], [place], [address] Phone: [phone]   Fax:  [phone]  Name: Kim Singh MRN: [id number] Date of Birth: [birth year]

## 2016-06-05 ENCOUNTER — Encounter: Payer: Medicare Other | Admitting: Physical Therapy

## 2016-06-07 ENCOUNTER — Ambulatory Visit: Payer: Medicare Other | Admitting: Physical Therapy

## 2016-06-07 DIAGNOSIS — R2681 Unsteadiness on feet: Secondary | ICD-10-CM | POA: Diagnosis not present

## 2016-06-07 DIAGNOSIS — R2689 Other abnormalities of gait and mobility: Secondary | ICD-10-CM | POA: Diagnosis not present

## 2016-06-08 NOTE — Therapy (Signed)
[facility] [address] [place], [place], [address] Phone: [phone]   Fax:  [phone]  Physical Therapy Treatment  Patient Details  Name: Kim Singh MRN: [id number] Date of Birth: 04/14/1941 Referring Provider: Garret Reddish, MD  Encounter Date: 06/07/2016      PT End of Session - 06/08/16 1444    Visit Number 3   Number of Visits 9   Date for PT Re-Evaluation 06/08/16   Authorization Type Medicare   Authorization Time Period 05-08-16 - 07-07-16   PT Start Time 1316   PT Stop Time 1400   PT Time Calculation (min) 44 min      Past Medical History:  Diagnosis Date  . Diabetes mellitus type 2, uncomplicated ([place])   . Pure hypercholesterolemia   . Unspecified disorder of thyroid   . Unspecified essential hypertension     Past Surgical History:  Procedure Laterality Date  . BREAST LUMPECTOMY     L  . THYROIDECTOMY     total    There were no vitals filed for this visit.      Subjective Assessment - 06/08/16 1440    Subjective Pt reports she is having a hard time getting motivated to do the exercises - plans on going with daughter to [place] today to check it out   Pertinent History Type II DM with neurological manifestations: Neuropathy in feet:  h/o orthostatic hypotension:  h/o breast cancer and thyroid cancer:  HTN   Patient Stated Goals "wants to feel good": wants to comb hair without arms getting tired   Currently in Pain? No/denies                         [facility] Adult PT Treatment/Exercise - 06/08/16 0001      Lumbar Exercises: Stretches   Active Hamstring Stretch 1 rep;30 seconds     Knee/Hip Exercises: Machines for Strengthening   Cybex Leg Press bil. LE's 50# 2 sets 10 reps; 60# 10 reps     Knee/Hip Exercises: Standing   Heel Raises Both;10 reps  unilateral 10 reps - each leg     Ankle Exercises: Stretches   Gastroc Stretch 1 rep;30 seconds   Other Stretch  Runner's stretch bil. LE's 30 sec hold     Ankle Exercises: Aerobic   Stationary Bike SciFit level 1.5 x 5"      Self Care - discussed benefits and options for community exercise, including aquatic ex programs; discussed Silver Sneakers class For ongoing exercise participation           PT Education - 06/08/16 1443    Education provided Yes   Education Details discussed options for participation in community ex classes including aquatic ex and Silver Sneakers   Person(s) Educated Patient   Methods Explanation   Comprehension Verbalized understanding             PT Long Term Goals - 06/08/16 1444      PT LONG TERM GOAL #1   Title Increase strength in bil. LE's so pt able to perform sit to stand 10x from 18" mat surface without UE support.  (06-08-16)   Baseline Pt performed 8 reps without UE support prior to fatigue -- 06-07-16   Status Partially Met     PT LONG TERM GOAL #2   Title Increase high level balance skills so pt able to perform L SLS >/= 8 secs.  (06-08-16)   Baseline no significant  change in L SLS as approx. 3-4 secs - 06-18-2016   Status Not Met     PT LONG TERM GOAL #3   Title Pt will report at least 50% improvement in step negotiation at home - including going up/down steps at least 3-4 times/day for exercise.  (06-08-16)   Baseline Pt reports she can do this but doesn't - having a hard time getting motivated to start an exercise program --2016/06/18   Status Partially Met     PT LONG TERM GOAL #4   Title Perform FGA and establish goal as appropriate.  (06-08-16)   Baseline deferred goal as pt is functional and safe with amb. -- 2016/06/18   Status Deferred     PT LONG TERM GOAL #5   Title Independent in HEP for balance and strengthening.  (06-08-16)   Baseline Pt has understanding of HEP but feels she would be more compliant and participatory in a group exercise class (community)   Status Achieved     PT LONG TERM GOAL #6   Title Increase ABC score  (in FOTO) to >/= 70% to demo increased confidence with ambulation/mobility.  (06-08-16)   Baseline D/C due to pt's request - not retaken   Status Deferred               Plan - 06/08/16 1451    Clinical Impression Statement Pt has met LTG #5 and partially met 1 and 3: other goals deferred due to discharge at this time - pt reports having a difficult time getting motivated to independently exercise at home -  states she is planning on joining a community exercise group ; D/C due to pt's request with plan to cont ex in community    PT Frequency 2x / week   PT Duration 4 weeks   PT Treatment/Interventions ADLs/Self Care Home Management;Gait training;Stair training;Therapeutic activities;Therapeutic exercise;Balance training;Neuromuscular re-education;Patient/family education   PT Next Visit Plan N/A - D/C   Consulted and Agree with Plan of Care Patient      Patient will benefit from skilled therapeutic intervention in order to improve the following deficits and impairments:  Difficulty walking, Decreased endurance, Decreased balance, Decreased strength  Visit Diagnosis: Other abnormalities of gait and mobility  Unsteadiness on feet       G-Codes - 18-Jun-2016 1453    Functional Assessment Tool Used clinical judgment   Functional Limitation Mobility: Walking and moving around   Mobility: Walking and Moving Around Goal Status [phone]) At least 1 percent but less than 20 percent impaired, limited or restricted   Mobility: Walking and Moving Around Discharge Status [phone]) At least 1 percent but less than 20 percent impaired, limited or restricted      Problem List Patient Active Problem List   Diagnosis Date Noted  . Diabetic neuropathy ([place]) 08/22/2015  . Depression 02/28/2015  . Former smoker 02/28/2015  . History of breast cancer 11/30/2013  . History of thyroid cancer 11/30/2013  . Orthostatic hypotension 11/29/2013  . Dyslipidemia 11/29/2013  . Hypothyroidism 11/29/2013   . Type II diabetes mellitus with neurological manifestations ([place]) 11/29/2013  . Essential hypertension 11/29/2013     PHYSICAL THERAPY DISCHARGE SUMMARY  Visits from Start of Care: 3  Current functional level related to goals / functional outcomes: See above for progress towards goals   Remaining deficits: Decreased high level balance skills, primarily SLS on LLE:  Pt reports feeling very fatigued and having a difficult time getting herself motivated  to do HEP at home  independently - feels that she would be more compliant with community group exercise - options discussed   Education / Equipment: See above - pt has been educated in options for community exercise program - including aquatic ex at [facility] as well as [facility] [employer] and their  locations Plan: Patient agrees to discharge.  Patient goals were partially met. Patient is being discharged due to the patient's request.  ?????  Mutual agreement that pt is ready for D/C and is ready and able to continue on her own in community ex program.             Alda Lea, PT 06/08/2016, 2:55 PM  [facility] [address] [place], [place], [address] Phone: [phone]   Fax:  [phone]  Name: Kim Singh MRN: [id number] Date of Birth: Feb 23, 1941

## 2016-06-11 ENCOUNTER — Encounter: Payer: Medicare Other | Admitting: Physical Therapy

## 2016-06-18 ENCOUNTER — Encounter: Payer: Medicare Other | Admitting: Physical Therapy

## 2016-07-22 ENCOUNTER — Encounter: Payer: Self-pay | Admitting: Physician Assistant

## 2016-07-22 ENCOUNTER — Emergency Department (HOSPITAL_COMMUNITY)
Admission: EM | Admit: 2016-07-22 | Discharge: 2016-07-22 | Disposition: A | Discharge status: Home / Self Care | Payer: Medicare Other | Attending: Emergency Medicine | Admitting: Emergency Medicine

## 2016-07-22 ENCOUNTER — Emergency Department (HOSPITAL_COMMUNITY): Payer: Medicare Other

## 2016-07-22 ENCOUNTER — Encounter (HOSPITAL_COMMUNITY): Payer: Self-pay

## 2016-07-22 DIAGNOSIS — R079 Chest pain, unspecified: Secondary | ICD-10-CM | POA: Diagnosis not present

## 2016-07-22 DIAGNOSIS — E039 Hypothyroidism, unspecified: Secondary | ICD-10-CM | POA: Insufficient documentation

## 2016-07-22 DIAGNOSIS — R0789 Other chest pain: Secondary | ICD-10-CM | POA: Diagnosis not present

## 2016-07-22 DIAGNOSIS — Z79899 Other long term (current) drug therapy: Secondary | ICD-10-CM | POA: Insufficient documentation

## 2016-07-22 DIAGNOSIS — Z85850 Personal history of malignant neoplasm of thyroid: Secondary | ICD-10-CM | POA: Diagnosis not present

## 2016-07-22 DIAGNOSIS — Z7982 Long term (current) use of aspirin: Secondary | ICD-10-CM | POA: Insufficient documentation

## 2016-07-22 DIAGNOSIS — Z87891 Personal history of nicotine dependence: Secondary | ICD-10-CM | POA: Diagnosis not present

## 2016-07-22 DIAGNOSIS — Z7984 Long term (current) use of oral hypoglycemic drugs: Secondary | ICD-10-CM | POA: Insufficient documentation

## 2016-07-22 DIAGNOSIS — E1140 Type 2 diabetes mellitus with diabetic neuropathy, unspecified: Secondary | ICD-10-CM | POA: Diagnosis not present

## 2016-07-22 DIAGNOSIS — Z853 Personal history of malignant neoplasm of breast: Secondary | ICD-10-CM | POA: Diagnosis not present

## 2016-07-22 LAB — BASIC METABOLIC PANEL
Anion gap: 8 (ref 5–15)
BUN: 18 mg/dL (ref 6–20)
CHLORIDE: 104 mmol/L (ref 101–111)
CO2: 27 mmol/L (ref 22–32)
CREATININE: 0.88 mg/dL (ref 0.44–1.00)
Calcium: 9.5 mg/dL (ref 8.9–10.3)
GFR calc non Af Amer: >60 mL/min (ref >60)
Glucose, Bld: 96 mg/dL (ref 65–99)
POTASSIUM: 4 mmol/L (ref 3.5–5.1)
Sodium: 139 mmol/L (ref 135–145)

## 2016-07-22 LAB — CBC
HEMATOCRIT: 41.7 % (ref 36.0–46.0)
Hemoglobin: 14.5 g/dL (ref 12.0–15.0)
MCH: 31.7 pg (ref 26.0–34.0)
MCHC: 34.8 g/dL (ref 30.0–36.0)
MCV: 91 fL (ref 78.0–100.0)
Platelets: 194 10*3/uL (ref 150–400)
RBC: 4.58 MIL/uL (ref 3.87–5.11)
RDW: 13.4 % (ref 11.5–15.5)
WBC: 7.7 10*3/uL (ref 4.0–10.5)

## 2016-07-22 LAB — TROPONIN I
Troponin I: <0.03 ng/mL (ref <0.03)
Troponin I: <0.03 ng/mL (ref <0.03)

## 2016-07-22 NOTE — Progress Notes (Signed)
Cardiology fellow left a message on the staff book that patient needs OP dobutamine stress test arranged. I cannot find where patient has history of lung disease so may be able to undergo Lexi instead. Will request close OP f/u this week with APP or MD to discuss further testing. I have sent a message to our [facility] and our office will call the patient with this information. She saw Dr. Ron Parker in 2015.  Geneva Barrero PA-C

## 2016-07-22 NOTE — Discharge Instructions (Signed)

## 2016-07-22 NOTE — ED Notes (Signed)
Pt had chest and back pressure prior to coming to ED. Reports nausea with belching. Took two ibuprofens when pain started around midnight; reports relief of symptoms since taking medications.

## 2016-07-22 NOTE — ED Triage Notes (Addendum)
Pt endorses central chest/back pressure that began this evening. Pt denies anxiety, sob, but endorses "burping and nausea" Pt has high BP and DM. Pt is hypertensive in triage. Pt denies recent travel. Breath sounds clear.

## 2016-07-22 NOTE — ED Provider Notes (Signed)
[facility] DEPT Provider Note   CSN: [id number] Arrival date & time: 07/22/16  [id number]   By signing my name below, I, Delton Prairie, attest that this documentation has been prepared under the direction and in the presence of Varney Biles, MD  Electronically Signed: Delton Prairie, ED Scribe. 07/22/16. 3:46 AM.   History   Chief Complaint Chief Complaint  Patient presents with  . Back Pain  . Chest Pain   The history is provided by the patient. No language interpreter was used.   HPI Comments:  Kim Singh is a 75 y.o. female, with a hx of HTN, type 2 DM and dyslipidemia, who presents to the Emergency Department complaining of vague pressurized type chest discomfort which began in the PM yesterday. Pt reports back pain (described as spasms) which began earlier in the evening yesterday after sitting in an uncomfortable chair for a prolonged period of time, followed by belching and nausea (due to belching). Pt states she was in bed watching television when she notes she "just couldn't get comfortable" and then she started feeling some generalized anterior chest discomfort. Pt now has no chest pain, and thinks that she might have gotten anxious and convinced herself that she had chest pain. She has had 2 ibuprofen, around 11:30 PM, with complete relief of the pain once she got to the ER. Pt denies SOB, diaphoresis, hx of cardiac issues, recent chest pain or SOB upon exertion, any other associated symptoms and modifying factors at this time. Pt states she thinks her symptoms may be due to anxiety because she has been thinking about her husband who passed away 9 years ago today. Last EKG was done in 2015.   PCP: Garret Reddish, MD  Past Medical History:  Diagnosis Date  . Diabetes mellitus type 2, uncomplicated ([place])   . Pure hypercholesterolemia   . Unspecified disorder of thyroid   . Unspecified essential hypertension     Patient Active Problem List   Diagnosis Date Noted  .  Diabetic neuropathy ([place]) 08/22/2015  . Depression 02/28/2015  . Former smoker 02/28/2015  . History of breast cancer 11/30/2013  . History of thyroid cancer 11/30/2013  . Orthostatic hypotension 11/29/2013  . Dyslipidemia 11/29/2013  . Hypothyroidism 11/29/2013  . Type II diabetes mellitus with neurological manifestations ([place]) 11/29/2013  . Essential hypertension 11/29/2013    Past Surgical History:  Procedure Laterality Date  . BREAST LUMPECTOMY     L  . THYROIDECTOMY     total    OB History    No data available       Home Medications    Prior to Admission medications   Medication Sig Start Date End Date Taking? Authorizing Provider  aspirin 81 MG tablet Take 81 mg by mouth daily.   Yes Historical Provider, MD  CALCIUM-VITAMIN D PO Take 1 tablet by mouth 2 (two) times daily.    Yes Historical Provider, MD  citalopram (CELEXA) 40 MG tablet Take 1 tablet (40 mg total) by mouth daily. 08/22/15  Yes Marin Olp, MD  CRESTOR 10 MG tablet TAKE 1 TABLET DAILY 10/20/15  Yes Marin Olp, MD  levothyroxine (SYNTHROID, LEVOTHROID) 112 MCG tablet TAKE 1 TABLET DAILY 09/16/15  Yes Marin Olp, MD  metFORMIN (GLUCOPHAGE) 1000 MG tablet TAKE 1 TABLET DAILY WITH BREAKFAST 09/16/15  Yes Marin Olp, MD  nadolol (CORGARD) 40 MG tablet TAKE 1 TABLET DAILY 09/16/15  Yes Marin Olp, MD  Omega-3-acid Ethyl Esters (OMACOR PO) Take 1  tablet by mouth 2 (two) times daily.    Yes Historical Provider, MD  quinapril (ACCUPRIL) 10 MG tablet TAKE 1 TABLET DAILY 09/16/15  Yes Marin Olp, MD  ZETIA 10 MG tablet TAKE 1 TABLET DAILY 09/16/15  Yes Marin Olp, MD    Family History Family History  Problem Relation Age of Onset  . Diabetes Mother     Social History Social History  Substance Use Topics  . Smoking status: Former Smoker    Packs/day: 1.00    Years: 41.00    Types: Cigarettes    Quit date: 08/02/1997  . Smokeless tobacco: Never Used  . Alcohol use 0.0  oz/week     Comment: glass of wine every day     Allergies   Codeine   Review of Systems Review of Systems  Constitutional: Negative for diaphoresis.  Respiratory: Negative for shortness of breath.   Cardiovascular: Positive for chest pain.  Gastrointestinal: Positive for nausea. Negative for vomiting.  Musculoskeletal: Positive for back pain.  10 systems reviewed and all are negative for acute change except as noted in the HPI.  Physical Exam Updated Vital Signs BP 124/77   Pulse (!) 59   Resp 15   Ht 5\' 8"  (1.727 m)   Wt 186 lb (84.4 kg)   SpO2 95%   BMI 28.28 kg/m   Physical Exam  Constitutional: She is oriented to person, place, and time. She appears well-developed and well-nourished. No distress.  HENT:  Head: Normocephalic and atraumatic.  Eyes: EOM are normal.  Neck: Normal range of motion.  Cardiovascular: Normal rate, regular rhythm and normal heart sounds.   Pulmonary/Chest: Effort normal and breath sounds normal.  Abdominal: Soft. She exhibits no distension. There is no tenderness.  Musculoskeletal: Normal range of motion.  Neurological: She is alert and oriented to person, place, and time.  Skin: Skin is warm and dry.  Psychiatric: She has a normal mood and affect. Judgment normal.  Nursing note and vitals reviewed.   ED Treatments / Results  DIAGNOSTIC STUDIES:  Oxygen Saturation is 99% on RA, normal by my interpretation.    COORDINATION OF CARE:  3:44 AM Discussed treatment plan with pt at bedside and pt agreed to plan.  Labs (all labs ordered are listed, but only abnormal results are displayed) Labs Reviewed  BASIC METABOLIC PANEL  CBC  TROPONIN I  TROPONIN I    EKG  EKG Interpretation None       Radiology Dg Chest 2 View  Result Date: 07/22/2016 CLINICAL DATA:  Chest pain for 3 days. EXAM: CHEST  2 VIEW COMPARISON:  None. FINDINGS: There is elevation of the right hemidiaphragm. Heart is at the upper limits normal in size with  tortuosity and atherosclerosis of thoracic aorta. No pulmonary edema. Mild bibasilar opacities favoring atelectasis. No confluent airspace disease, pleural fluid or pneumothorax. No acute osseous abnormality is seen. IMPRESSION: Borderline cardiomegaly with tortuous atherosclerotic thoracic aorta Mild bibasilar atelectasis. Electronically Signed   By: Jeb Levering M.D.   On: 07/22/2016 01:55    Procedures Procedures (including critical care time)  Medications Ordered in ED Medications - No data to display   Initial Impression / Assessment and Plan / ED Course  I have reviewed the triage vital signs and the nursing notes.  Pertinent labs & imaging results that were available during my care of the patient were reviewed by me and considered in my medical decision making (see chart for details).  Clinical Course  I personally performed the services described in this documentation, which was scribed in my presence. The recorded information has been reviewed and is accurate.   Pt comes in with very vague symptoms of possible chest discomfort and certainly some back pain. She is currently pain free. Pain started In the back - midline and R sided in the evening. She thinks that she had chest discomfort - but really not able to describe it, and feels like maybe she never even had chest pain.  DDX: ACS, GERD, Esophageal spasm.  EKG has non specific changes. She had a similar EKG in 2015. No cardiac hx, but pt has DM, HTN, HL and is elderly. Chest pain is not specific at all.  Pt wants to go home. I discussed the results, and the fact that women and diabetics can present with atypical pain as a warning sign of ACS.  Pt would prefer to go home still -reports her neighbor is a cardiologist, she lives with family, and would greatly appreciate outpatient workup if it's possible.  Although HEAR score is 5 - based on ekg, age and risk factors, I think in this case, it might be OK to d/c with  prompt f/u and strict return precautions. I will contact Cards team about f/u via email if her trops are neg and she remains pain free.   Final Clinical Impressions(s) / ED Diagnoses   Final diagnoses:  Atypical chest pain    New Prescriptions New Prescriptions   No medications on file      Varney Biles, MD 07/22/16 0501

## 2016-10-02 ENCOUNTER — Other Ambulatory Visit: Payer: Self-pay | Admitting: Family Medicine

## 2016-10-18 ENCOUNTER — Ambulatory Visit: Payer: Medicare Other | Admitting: Family Medicine

## 2016-10-18 ENCOUNTER — Encounter: Payer: Self-pay | Admitting: Family Medicine

## 2016-10-18 ENCOUNTER — Ambulatory Visit (INDEPENDENT_AMBULATORY_CARE_PROVIDER_SITE_OTHER): Payer: Medicare Other | Admitting: Family Medicine

## 2016-10-18 DIAGNOSIS — E1149 Type 2 diabetes mellitus with other diabetic neurological complication: Secondary | ICD-10-CM | POA: Diagnosis not present

## 2016-10-18 DIAGNOSIS — E039 Hypothyroidism, unspecified: Secondary | ICD-10-CM | POA: Diagnosis not present

## 2016-10-18 DIAGNOSIS — E1142 Type 2 diabetes mellitus with diabetic polyneuropathy: Secondary | ICD-10-CM | POA: Diagnosis not present

## 2016-10-18 LAB — POC URINALSYSI DIPSTICK (AUTOMATED)
BILIRUBIN UA: NEGATIVE
Blood, UA: NEGATIVE
Glucose, UA: NEGATIVE
KETONES UA: NEGATIVE
LEUKOCYTES UA: NEGATIVE
Nitrite, UA: NEGATIVE
PH UA: 6 (ref 5.0–8.0)
Protein, UA: NEGATIVE
Spec Grav, UA: 1.005 (ref 1.030–1.035)
Urobilinogen, UA: 0.2 (ref ?–2.0)

## 2016-10-18 LAB — TSH: TSH: 1.21 mIU/L

## 2016-10-18 NOTE — Patient Instructions (Addendum)
4-6 month follow up  Return precautions for skin tag talked through   ______________________________________________________________________  Starting October 1st 2018, I will be transferring to our new location: [facility] (corner of [address] from [employer]) [employer], [place] Phone: [phone]  I would love to have you remain my patient at this new location as long as it remains convenient for you. I am excited about the opportunity to have x-ray and sports medicine in the new building but will really miss the awesome staff and physicians at [facility]. Continue to schedule appointments at [facility] and we will automatically transfer them to the [facility] location starting October 1st.

## 2016-10-18 NOTE — Assessment & Plan Note (Signed)
S: suspect controlled. On metformin 1000mg  daily. Lost 4 lbs down since last visit- congratulate CBGs- no lows Lab Results  Component Value Date   HGBA1C 6.6 (H) 04/17/2016   HGBA1C 6.2 12/21/2015   HGBA1C 6.8 (H) 08/22/2015   A/P: update a1c

## 2016-10-18 NOTE — Progress Notes (Signed)
Subjective:  Kim Singh is a 76 y.o. year old very pleasant female patient who presents for/with See problem oriented charting ROS- no hypoglycemia. No chest pain. Occasional right arm pain. No shortness of breath. Skin tag left chest   Past Medical History-  Patient Active Problem List   Diagnosis Date Noted  . Type II diabetes mellitus with neurological manifestations ([place]) 11/29/2013    Priority: High  . Diabetic neuropathy ([place]) 08/22/2015    Priority: Medium  . Depression 02/28/2015    Priority: Medium  . Dyslipidemia 11/29/2013    Priority: Medium  . Hypothyroidism 11/29/2013    Priority: Medium  . Essential hypertension 11/29/2013    Priority: Medium  . Former smoker 02/28/2015    Priority: Low  . History of breast cancer 11/30/2013    Priority: Low  . History of thyroid cancer 11/30/2013    Priority: Low  . Orthostatic hypotension 11/29/2013    Priority: Low    Medications- reviewed and updated Current Outpatient Prescriptions  Medication Sig Dispense Refill  . aspirin 81 MG tablet Take 81 mg by mouth daily.    Marland Kitchen CALCIUM-VITAMIN D PO Take 1 tablet by mouth 2 (two) times daily.     . citalopram (CELEXA) 40 MG tablet Take 1 tablet (40 mg total) by mouth daily. 90 tablet 3  . ezetimibe (ZETIA) 10 MG tablet TAKE 1 TABLET DAILY 90 tablet 3  . levothyroxine (SYNTHROID, LEVOTHROID) 112 MCG tablet TAKE 1 TABLET DAILY 90 tablet 3  . metFORMIN (GLUCOPHAGE) 1000 MG tablet TAKE 1 TABLET DAILY WITH BREAKFAST 90 tablet 3  . nadolol (CORGARD) 40 MG tablet TAKE 1 TABLET DAILY 90 tablet 3  . Omega-3-acid Ethyl Esters (OMACOR PO) Take 1 tablet by mouth 2 (two) times daily.     . quinapril (ACCUPRIL) 10 MG tablet TAKE 1 TABLET DAILY 90 tablet 3  . rosuvastatin (CRESTOR) 10 MG tablet TAKE 1 TABLET DAILY 90 tablet 3   No current facility-administered medications for this visit.     Objective: BP 118/86 (BP Location: Left Arm, Patient Position: Sitting, Cuff Size: Large)   Pulse  (!) 55   Temp 98.1 F (36.7 C) (Oral)   Ht 5\' 8"  (1.727 m)   Wt 182 lb (82.6 kg)   SpO2 90%   BMI 27.67 kg/m  Gen: NAD, resting comfortably CV: RRR no murmurs rubs or gallops Lungs: CTAB no crackles, wheeze, rhonchi Ext: no edema Skin: warm, dry  1-2 mm skin tag on left upper chest. Removed with scissors and light amount of silver nitrate today. Was rubbing under necklace causing irritation.   Assessment/Plan:  Hypothyroidism S: Lab Results  Component Value Date   TSH 0.32 (L) 04/17/2016   On thyroid medication-levothyroxine 137mcg A/P: update tsh today  Type II diabetes mellitus with neurological manifestations ([place]) S: suspect controlled. On metformin 1000mg  daily. Lost 4 lbs down since last visit- congratulate CBGs- no lows Lab Results  Component Value Date   HGBA1C 6.6 (H) 04/17/2016   HGBA1C 6.2 12/21/2015   HGBA1C 6.8 (H) 08/22/2015   A/P: update a1c  Diabetic neuropathy (HCC) S: balance issues due to neuropathy. Has been going to PT A/P: feels like this has helped. Graduated. Worries about ulcers and amputations- discussed how to care for feet to help avoid this  4-6 month follow up.   After visit- TSH came back normal. A1c remains very well controlled at  Lab Results  Component Value Date   HGBA1C 5.9 (H) 10/18/2016  Orders Placed This Encounter  Procedures  . Hemoglobin A1c  . TSH  . POCT Urinalysis Dipstick (Automated)   Return precautions advised.  Garret Reddish, MD

## 2016-10-18 NOTE — Assessment & Plan Note (Signed)
S: balance issues due to neuropathy. Has been going to PT A/P: feels like this has helped. Graduated. Worries about ulcers and amputations- discussed how to care for feet to help avoid this

## 2016-10-18 NOTE — Assessment & Plan Note (Signed)
S: Lab Results  Component Value Date   TSH 0.32 (L) 04/17/2016   On thyroid medication-levothyroxine 167mcg A/P: update tsh today

## 2016-10-18 NOTE — Progress Notes (Signed)
Pre visit review using our clinic review tool, if applicable. No additional management support is needed unless otherwise documented below in the visit note. 

## 2016-10-19 LAB — HEMOGLOBIN A1C
HEMOGLOBIN A1C: 5.9 % — AB (ref <5.7)
MEAN PLASMA GLUCOSE: 123 mg/dL

## 2016-11-26 ENCOUNTER — Telehealth: Payer: Self-pay | Admitting: Family Medicine

## 2016-11-26 MED ORDER — CITALOPRAM HYDROBROMIDE 40 MG PO TABS: 40.0000 mg | ORAL_TABLET | Freq: Every day | ORAL | 0 refills | Status: DC

## 2016-11-26 NOTE — Telephone Encounter (Signed)
Medication was refilled as requested by pt.  

## 2016-11-26 NOTE — Telephone Encounter (Signed)
Pt need new Rx for citalopram  #30  Pharm:  [facility] [place] and [place] (only need due to [facility] will not be able to get it to her until well after the middle of the month).

## 2016-11-27 ENCOUNTER — Other Ambulatory Visit: Payer: Self-pay | Admitting: Family Medicine

## 2017-01-31 ENCOUNTER — Other Ambulatory Visit: Payer: Self-pay | Admitting: Family Medicine

## 2017-01-31 DIAGNOSIS — Z1231 Encounter for screening mammogram for malignant neoplasm of breast: Secondary | ICD-10-CM

## 2017-03-12 ENCOUNTER — Ambulatory Visit
Admission: RE | Admit: 2017-03-12 | Discharge: 2017-03-12 | Disposition: A | Discharge status: Home / Self Care | Payer: Medicare Other | Source: Ambulatory Visit | Attending: Family Medicine | Admitting: Family Medicine

## 2017-03-12 DIAGNOSIS — Z1231 Encounter for screening mammogram for malignant neoplasm of breast: Secondary | ICD-10-CM

## 2017-03-12 HISTORY — DX: Malignant neoplasm of unspecified site of unspecified female breast: C50.919

## 2017-03-12 HISTORY — DX: Personal history of antineoplastic chemotherapy: Z92.21

## 2017-03-12 HISTORY — DX: Personal history of irradiation: Z92.3

## 2017-04-22 NOTE — Progress Notes (Signed)
 Subjective:   Kim Singh is a 76 y.o. female who presents for Medicare Annual/Subsequent preventive examination.  Pt lives with daughter and her family in a single family home.  Review of Systems:  No ROS.  Medicare Wellness Visit. Additional risk factors are reflected in the social history.        Objective:    Vitals: There were no vitals taken for this visit.  There is no height or weight on file to calculate BMI.  Tobacco History  Smoking Status  . Former Smoker  . Packs/day: 1.00  . Years: 41.00  . Types: Cigarettes  . Quit date: 08/02/1997  Smokeless Tobacco  . Never Used     Counseling given: Not Answered   Past Medical History:  Diagnosis Date  . Breast cancer (HCC) 2001   Left  . Diabetes mellitus type 2, uncomplicated (HCC)   . Personal history of chemotherapy   . Personal history of radiation therapy   . Pure hypercholesterolemia   . Unspecified disorder of thyroid   . Unspecified essential hypertension    Past Surgical History:  Procedure Laterality Date  . BREAST EXCISIONAL BIOPSY Left   . BREAST LUMPECTOMY Left 2001  . THYROIDECTOMY     total   Family History  Problem Relation Age of Onset  . Diabetes Mother    History  Sexual Activity  . Sexual activity: Not on file    Outpatient Encounter Prescriptions as of 04/23/2017  Medication Sig  . aspirin 81 MG tablet Take 81 mg by mouth daily.  . CALCIUM-VITAMIN D PO Take 1 tablet by mouth 2 (two) times daily.   . citalopram (CELEXA) 40 MG tablet TAKE 1 TABLET DAILY  . ezetimibe (ZETIA) 10 MG tablet TAKE 1 TABLET DAILY  . levothyroxine (SYNTHROID, LEVOTHROID) 112 MCG tablet TAKE 1 TABLET DAILY  . metFORMIN (GLUCOPHAGE) 1000 MG tablet TAKE 1 TABLET DAILY WITH BREAKFAST  . nadolol (CORGARD) 40 MG tablet TAKE 1 TABLET DAILY  . Omega-3-acid Ethyl Esters (OMACOR PO) Take 1 tablet by mouth 2 (two) times daily.   . quinapril (ACCUPRIL) 10 MG tablet TAKE 1 TABLET DAILY  . rosuvastatin (CRESTOR) 10  MG tablet TAKE 1 TABLET DAILY   No facility-administered encounter medications on file as of 04/23/2017.     Activities of Daily Living In your present state of health, do you have any difficulty performing the following activities: 05/08/2016  Hearing? Y  Comment has a hearing aid   Vision? N  Comment eyes checked   Difficulty concentrating or making decisions? N  Walking or climbing stairs? N  Dressing or bathing? N  Doing errands, shopping? N  Preparing Food and eating ? N  Using the Toilet? N  In the past six months, have you accidently leaked urine? N  Do you have problems with loss of bowel control? N  Managing your Medications? N  Managing your Finances? N  Housekeeping or managing your Housekeeping? N  Some recent data might be hidden    Patient Care Team: Hunter, Stephen O, MD as PCP - General (Family Medicine)   Assessment:    Physical assessment deferred to PCP.  Diabetic Foot Exam - Simple   Simple Foot Form Diabetic Foot exam was performed with the following findings:  Yes 04/23/2017 11:07 AM  Visual Inspection No deformities, no ulcerations, no other skin breakdown bilaterally:  Yes Sensation Testing See comments:  Yes Pulse Check Posterior Tibialis and Dorsalis pulse intact bilaterally:  Yes Comments Some numbness   to bottom of right foot and the toes to the left foot.      Exercise Activities and Dietary recommendations    Goals    . Exercise 150 minutes per week (moderate activity)          Will continue with PT  Will develop a personal plan of exercise that meets your needs;  Older adults aged 72 or older, who are generally fit and have no health conditions that limit their mobility, should try to be active daily and should do: at least 150 minutes of moderate aerobic activity such as cycling or walking every week, and  strength exercises on two or more days a week that work all the major muscles (legs, hips, back, abdomen, chest, shoulders and  arms).      OR  75 minutes of vigorous aerobic activity such as running or a game of singles tennis every week, and  strength exercises on two or more days a week that work all the major muscles (legs, hips, back, abdomen, chest, shoulders and arms).     OR  a mix of moderate and vigorous aerobic activity every week. For example, two 30-minute runs, plus 30 minutes of fast walking, equates to 150 minutes of moderate aerobic activity, and  strength exercises on two or more days a week that work all the major muscles (legs, hips, back, abdomen, chest, shoulders and arms).  A rule of thumb is that one minute of vigorous activity provides the same health benefits as two minutes of moderate activity. You should also try to break up long periods of sitting with light activity, as sedentary behaviour is now considered an independent risk factor for ill health, no matter how much exercise you do. Find out why sitting is bad for your health. Older adults at risk of falls, such as people with weak legs, poor balance and some medical conditions, should do exercises to improve balance and co-ordination on at least two days a week. Examples include yoga, tai chi and dancing.        Fall Risk Fall Risk  05/08/2016 08/22/2015 06/28/2014  Falls in the past year? Yes No No  Number falls in past yr: 1 - -  Follow up Education provided - -   Depression Screen PHQ 2/9 Scores 05/08/2016 08/22/2015 06/28/2014  PHQ - 2 Score 0 0 0    Cognitive Function  Ad8 score reviewed for issues:  Issues making decisions: no  Less interest in hobbies / activities:no  Repeats questions, stories (family complaining):no  Trouble using ordinary gadgets (microwave,no computer, phone):no  Forgets the month or year: no  Mismanaging finances: no  Remembering appts:no  Daily problems with thinking and/or memory:no Ad8 score is=0    MMSE - Mini Mental State Exam 05/08/2016  Not completed: (No Data)         Immunization History  Administered Date(s) Administered  . Influenza, High Dose Seasonal PF 06/28/2014, 04/06/2016  . Pneumococcal Conjugate-13 06/28/2014, 04/17/2016  . Pneumococcal Polysaccharide-23 08/22/2015  . Tdap 04/17/2016   Screening Tests Health Maintenance  Topic Date Due  . OPHTHALMOLOGY EXAM  11/07/2016  . INFLUENZA VACCINE  02/20/2017  . FOOT EXAM  04/17/2017  . HEMOGLOBIN A1C  04/20/2017  . TETANUS/TDAP  04/17/2026  . DEXA SCAN  Completed  . PNA vac Low Risk Adult  Completed      Plan:   Follow up with PCP as directed.  I have personally reviewed and noted the following in the patient's chart:   .  Medical and social history . Use of alcohol, tobacco or illicit drugs  . Current medications and supplements . Functional ability and status . Nutritional status . Physical activity . Advanced directives . List of other physicians . Vitals . Screenings to include cognitive, depression, and falls . Referrals and appointments  In addition, I have reviewed and discussed with patient certain preventive protocols, quality metrics, and best practice recommendations. A written personalized care plan for preventive services as well as general preventive health recommendations were provided to patient.     Ree Edman, RN  04/22/2017

## 2017-04-22 NOTE — Progress Notes (Signed)
PCP notes:   Health maintenance: Opthalmology Exam: Pt is overdue. She states she will schedule this and have the results sent to the office.  Flu: Has been having a cough and some phlegm.  Foot exam: Completed today. HGB A1C: Seeing Dr Yong Channel today.  Abnormal screenings: None.   Patient concerns: Pt experiences constipation and then diarrhea. States it has been a long time since she has seen a gastroenterology. States she has eye twitching and jumpiness.    Nurse concerns: None.   Next PCP appt: 04/23/17 11:00.

## 2017-04-22 NOTE — Progress Notes (Signed)
Pre visit review using our clinic review tool, if applicable. No additional management support is needed unless otherwise documented below in the visit note. 

## 2017-04-23 ENCOUNTER — Ambulatory Visit (INDEPENDENT_AMBULATORY_CARE_PROVIDER_SITE_OTHER): Payer: Medicare Other | Admitting: Family Medicine

## 2017-04-23 ENCOUNTER — Encounter: Payer: Self-pay | Admitting: *Deleted

## 2017-04-23 ENCOUNTER — Ambulatory Visit (INDEPENDENT_AMBULATORY_CARE_PROVIDER_SITE_OTHER): Payer: Medicare Other | Admitting: *Deleted

## 2017-04-23 ENCOUNTER — Encounter: Payer: Self-pay | Admitting: Family Medicine

## 2017-04-23 VITALS — BP 118/82 | HR 64 | Temp 97.8°F | Resp 16 | Ht 68.0 in | Wt 175.4 lb

## 2017-04-23 VITALS — BP 118/82 | HR 64 | Temp 98.2°F | Resp 16 | Ht 68.0 in | Wt 175.4 lb

## 2017-04-23 DIAGNOSIS — E1149 Type 2 diabetes mellitus with other diabetic neurological complication: Secondary | ICD-10-CM | POA: Diagnosis not present

## 2017-04-23 DIAGNOSIS — F325 Major depressive disorder, single episode, in full remission: Secondary | ICD-10-CM

## 2017-04-23 DIAGNOSIS — I10 Essential (primary) hypertension: Secondary | ICD-10-CM

## 2017-04-23 DIAGNOSIS — E039 Hypothyroidism, unspecified: Secondary | ICD-10-CM

## 2017-04-23 DIAGNOSIS — Z0000 Encounter for general adult medical examination without abnormal findings: Secondary | ICD-10-CM

## 2017-04-23 DIAGNOSIS — E785 Hyperlipidemia, unspecified: Secondary | ICD-10-CM | POA: Diagnosis not present

## 2017-04-23 LAB — TSH: TSH: 0.16 u[IU]/mL — AB (ref 0.35–4.50)

## 2017-04-23 LAB — HEMOGLOBIN A1C: Hgb A1c MFr Bld: 6.4 % (ref 4.6–6.5)

## 2017-04-23 LAB — LDL CHOLESTEROL, DIRECT: Direct LDL: 43 mg/dL

## 2017-04-23 NOTE — Assessment & Plan Note (Signed)
S: controlled on quinapril 10mg , nadolol 40mg .  BP Readings from Last 3 Encounters:  04/23/17 118/82  04/23/17 118/82  10/18/16 118/86  A/P: We discussed blood pressure goal of <140/90. Continue current meds

## 2017-04-23 NOTE — Patient Instructions (Addendum)
Try to get the flu shot before the end of October please. Ok to give some time to recover from recent cold and then make sure you dont get ill on the plane before getting.   Call eye doctor today to get set up for repeat eye exam

## 2017-04-23 NOTE — Progress Notes (Signed)
Subjective:  Kim Singh is a 76 y.o. year old very pleasant female patient who presents for/with See problem oriented charting ROS- doing well other than slight cough, runny nose which is improving- does not want to do flu shot. No chest pain or shortness of breath.    Past Medical History-  Patient Active Problem List   Diagnosis Date Noted  . Type II diabetes mellitus with neurological manifestations ([place]) 11/29/2013    Priority: High  . Diabetic neuropathy ([place]) 08/22/2015    Priority: Medium  . Depression 02/28/2015    Priority: Medium  . Dyslipidemia 11/29/2013    Priority: Medium  . Hypothyroidism 11/29/2013    Priority: Medium  . Essential hypertension 11/29/2013    Priority: Medium  . Former smoker 02/28/2015    Priority: Low  . History of breast cancer 11/30/2013    Priority: Low  . History of thyroid cancer 11/30/2013    Priority: Low  . Orthostatic hypotension 11/29/2013    Priority: Low    Medications- reviewed and updated Current Outpatient Prescriptions  Medication Sig Dispense Refill  . aspirin 81 MG tablet Take 81 mg by mouth daily.    Marland Kitchen CALCIUM-VITAMIN D PO Take 1 tablet by mouth 2 (two) times daily.     . citalopram (CELEXA) 40 MG tablet TAKE 1 TABLET DAILY 90 tablet 3  . ezetimibe (ZETIA) 10 MG tablet TAKE 1 TABLET DAILY 90 tablet 3  . levothyroxine (SYNTHROID, LEVOTHROID) 112 MCG tablet TAKE 1 TABLET DAILY 90 tablet 3  . metFORMIN (GLUCOPHAGE) 1000 MG tablet TAKE 1 TABLET DAILY WITH BREAKFAST 90 tablet 3  . nadolol (CORGARD) 40 MG tablet TAKE 1 TABLET DAILY 90 tablet 3  . Omega-3-acid Ethyl Esters (OMACOR PO) Take 1 tablet by mouth 2 (two) times daily.     . quinapril (ACCUPRIL) 10 MG tablet TAKE 1 TABLET DAILY 90 tablet 3  . rosuvastatin (CRESTOR) 10 MG tablet TAKE 1 TABLET DAILY 90 tablet 3   No current facility-administered medications for this visit.     Objective: BP 118/82   Pulse 64   Temp 98.2 F (36.8 C) (Oral)   Resp 16   Ht 5\' 8"   (1.727 m)   Wt 175 lb 6.4 oz (79.6 kg)   SpO2 95%   BMI 26.67 kg/m  Gen: NAD, resting comfortably TM normal, oropharynx normal, nares with some clear rhinorrhea CV: RRR no murmurs rubs or gallops Lungs: CTAB no crackles, wheeze, rhonchi Abdomen: soft/nontender/nondistended/normal bowel sounds. No rebound or guarding.  Ext: no edema Skin: warm, dry  Assessment/Plan:   Foot exam at [facility]- neuropathy noted/stable. No skin breaktdown Encouraged her to get her updated eye exam.   Hypothyroidism S: patient compliant with levothyroxine 156mcg. Occasional constipation and then has alrge volume after tat.  Lab Results  Component Value Date   TSH 1.21 10/18/2016  A/P: update tsh today. Especially with diarrhea but supect related to metformin.   Dyslipidemia  S:  controlled on zetia 10mg  and crestor 10mg . No myalgias.  Lab Results  Component Value Date   CHOL 84 12/09/2014   HDL 33.20 (L) 12/09/2014   LDLCALC 30 12/09/2014   LDLDIRECT 45.0 04/17/2016   TRIG 106.0 12/09/2014   CHOLHDL 3 12/09/2014   A/P: update ldl, likely continue current meds  Type II diabetes mellitus with neurological manifestations (Kim Singh) S: well controlled. On last check on metformin 1000mg  daily. Weight down another 7 lbs- trying to eat better, exercise more .  Lab Results  Component Value  Date   HGBA1C 5.9 (H) 10/18/2016   HGBA1C 6.6 (H) 04/17/2016   HGBA1C 6.2 12/21/2015   A/P: update a1c- if 5.8 or less consider reducing metformin to 500mg  daily   Essential hypertension S: controlled on quinapril 10mg , nadolol 40mg .  BP Readings from Last 3 Encounters:  04/23/17 118/82  04/23/17 118/82  10/18/16 118/86  A/P: We discussed blood pressure goal of <140/90. Continue current meds  Depression S: PHQ9 <5. Continue citalopram.  Depression screen [facility] 2/9 04/23/2017 05/08/2016 08/22/2015 06/28/2014  Decreased Interest 0 0 0 0  Down, Depressed, Hopeless 2 0 0 0  PHQ - 2 Score 2 0 0 0  Altered sleeping 0 - - -   Tired, decreased energy 0 - - -  Change in appetite 0 - - -  Feeling bad or failure about yourself  0 - - -  Trouble concentrating 0 - - -  Moving slowly or fidgety/restless 0 - - -  Suicidal thoughts 0 - - -  PHQ-9 Score 2 - - -  Difficult doing work/chores Not difficult at all - - -  A/P: continue citalopram 40mg   Future Appointments Date Time Provider [place]  04/24/2018 10:00 AM Stephanie Acre, RN [facility] None  04/24/2018 11:00 AM Marin Olp, MD [facility] None  advised flu shot when gets back from next trip   Return in about 6 months (around 10/22/2017) for follow up- or sooner if needed.  Orders Placed This Encounter  Procedures  . TSH    [place]  . Hemoglobin A1c    [place]  . LDL cholesterol, direct    [place]   Return precautions advised.  Garret Reddish, MD

## 2017-04-23 NOTE — Assessment & Plan Note (Signed)
S: patient compliant with levothyroxine 188mcg. Occasional constipation and then has alrge volume after tat.  Lab Results  Component Value Date   TSH 1.21 10/18/2016  A/P: update tsh today. Especially with diarrhea but supect related to metformin.

## 2017-04-23 NOTE — Assessment & Plan Note (Signed)
S: PHQ9 <5. Continue citalopram.  Depression screen [facility] 2/9 04/23/2017 05/08/2016 08/22/2015 06/28/2014  Decreased Interest 0 0 0 0  Down, Depressed, Hopeless 2 0 0 0  PHQ - 2 Score 2 0 0 0  Altered sleeping 0 - - -  Tired, decreased energy 0 - - -  Change in appetite 0 - - -  Feeling bad or failure about yourself  0 - - -  Trouble concentrating 0 - - -  Moving slowly or fidgety/restless 0 - - -  Suicidal thoughts 0 - - -  PHQ-9 Score 2 - - -  Difficult doing work/chores Not difficult at all - - -  A/P: continue citalopram 40mg 

## 2017-04-23 NOTE — Patient Instructions (Addendum)
Kim Singh , Thank you for taking time to come for your Medicare Wellness Visit. I appreciate your ongoing commitment to your health goals. Please review the following plan we discussed and let me know if I can assist you in the future.   These are the goals we discussed: Goals    . Exercise 150 minutes per week (moderate activity)          Will continue with PT  Will develop a personal plan of exercise that meets your needs;  Older adults aged 78 or older, who are generally fit and have no health conditions that limit their mobility, should try to be active daily and should do: at least 150 minutes of moderate aerobic activity such as cycling or walking every week, and  strength exercises on two or more days a week that work all the major muscles (legs, hips, back, abdomen, chest, shoulders and arms).      OR  75 minutes of vigorous aerobic activity such as running or a game of singles tennis every week, and  strength exercises on two or more days a week that work all the major muscles (legs, hips, back, abdomen, chest, shoulders and arms).     OR  a mix of moderate and vigorous aerobic activity every week. For example, two 30-minute runs, plus 30 minutes of fast walking, equates to 150 minutes of moderate aerobic activity, and  strength exercises on two or more days a week that work all the major muscles (legs, hips, back, abdomen, chest, shoulders and arms).  A rule of thumb is that one minute of vigorous activity provides the same health benefits as two minutes of moderate activity. You should also try to break up long periods of sitting with light activity, as sedentary behaviour is now considered an independent risk factor for ill health, no matter how much exercise you do. Find out why sitting is bad for your health. Older adults at risk of falls, such as people with weak legs, poor balance and some medical conditions, should do exercises to improve balance and co-ordination on at  least two days a week. Examples include yoga, tai chi and dancing.      . maintain current health status       This is a list of the screening recommended for you and due dates:  Health Maintenance  Topic Date Due  . Eye exam for diabetics  11/07/2016  . Flu Shot  02/20/2017  . Hemoglobin A1C  04/20/2017  . Complete foot exam   04/23/2018  . Tetanus Vaccine  04/17/2026  . DEXA scan (bone density measurement)  Completed  . Pneumonia vaccines  Completed   .Preventive Care for Adults  A healthy lifestyle and preventive care can promote health and wellness. Preventive health guidelines for adults include the following key practices.  . A routine yearly physical is a good way to check with your health care provider about your health and preventive screening. It is a chance to share any concerns and updates on your health and to receive a thorough exam.  . Visit your dentist for a routine exam and preventive care every 6 months. Brush your teeth twice a day and floss once a day. Good oral hygiene prevents tooth decay and gum disease.  . The frequency of eye exams is based on your age, health, family medical history, use  of contact lenses, and other factors. Follow your health care provider's ecommendations for frequency of eye exams.  Marland Kitchen  Eat a healthy diet. Foods like vegetables, fruits, whole grains, low-fat dairy products, and lean protein foods contain the nutrients you need without too many calories. Decrease your intake of foods high in solid fats, added sugars, and salt. Eat the right amount of calories for you. Get information about a proper diet from your health care provider, if necessary.  . Regular physical exercise is one of the most important things you can do for your health. Most adults should get at least 150 minutes of moderate-intensity exercise (any activity that increases your heart rate and causes you to sweat) each week. In addition, most adults need  muscle-strengthening exercises on 2 or more days a week.  Silver Sneakers may be a benefit available to you. To determine eligibility, you may visit the website: www.silversneakers.com or contact program at 2022081428 Mon-Fri between 8AM-8PM.   . Maintain a healthy weight. The body mass index (BMI) is a screening tool to identify possible weight problems. It provides an estimate of body fat based on height and weight. Your health care provider can find your BMI and can help you achieve or maintain a healthy weight.   For adults 20 years and older: ? A BMI below 18.5 is considered underweight. ? A BMI of 18.5 to 24.9 is normal. ? A BMI of 25 to 29.9 is considered overweight. ? A BMI of 30 and above is considered obese.   . Maintain normal blood lipids and cholesterol levels by exercising and minimizing your intake of saturated fat. Eat a balanced diet with plenty of fruit and vegetables. Blood tests for lipids and cholesterol should begin at age 41 and be repeated every 5 years. If your lipid or cholesterol levels are high, you are over 50, or you are at high risk for heart disease, you may need your cholesterol levels checked more frequently. Ongoing high lipid and cholesterol levels should be treated with medicines if diet and exercise are not working.  . If you smoke, find out from your health care provider how to quit. If you do not use tobacco, please do not start.  . If you choose to drink alcohol, please do not consume more than 2 drinks per day. One drink is considered to be 12 ounces (355 mL) of beer, 5 ounces (148 mL) of wine, or 1.5 ounces (44 mL) of liquor.  . If you are 75-73 years old, ask your health care provider if you should take aspirin to prevent strokes.  . Use sunscreen. Apply sunscreen liberally and repeatedly throughout the day. You should seek shade when your shadow is shorter than you. Protect yourself by wearing long sleeves, pants, a wide-brimmed hat, and  sunglasses year round, whenever you are outdoors.  . Once a month, do a whole body skin exam, using a mirror to look at the skin on your back. Tell your health care provider of new moles, moles that have irregular borders, moles that are larger than a pencil eraser, or moles that have changed in shape or color.

## 2017-04-23 NOTE — Progress Notes (Signed)
I have reviewed and agree with note, evaluation, plan.   Stephen Hunter, MD  

## 2017-04-23 NOTE — Assessment & Plan Note (Signed)
  S:  controlled on zetia 10mg  and crestor 10mg . No myalgias.  Lab Results  Component Value Date   CHOL 84 12/09/2014   HDL 33.20 (L) 12/09/2014   LDLCALC 30 12/09/2014   LDLDIRECT 45.0 04/17/2016   TRIG 106.0 12/09/2014   CHOLHDL 3 12/09/2014   A/P: update ldl, likely continue current meds

## 2017-04-23 NOTE — Assessment & Plan Note (Signed)
S: well controlled. On last check on metformin 1000mg  daily. Weight down another 7 lbs- trying to eat better, exercise more .  Lab Results  Component Value Date   HGBA1C 5.9 (H) 10/18/2016   HGBA1C 6.6 (H) 04/17/2016   HGBA1C 6.2 12/21/2015   A/P: update a1c- if 5.8 or less consider reducing metformin to 500mg  daily

## 2017-04-24 ENCOUNTER — Encounter: Payer: Self-pay | Admitting: *Deleted

## 2017-04-24 MED ORDER — LEVOTHYROXINE SODIUM 100 MCG PO TABS: 100.0000 ug | ORAL_TABLET | Freq: Every day | ORAL | 0 refills | Status: DC

## 2017-04-24 NOTE — Addendum Note (Signed)
Addended by: Modena Nunnery on: 04/24/2017 10:32 AM   Modules accepted: Orders

## 2017-05-02 ENCOUNTER — Telehealth: Payer: Self-pay

## 2017-05-02 MED ORDER — LORAZEPAM 0.5 MG PO TABS: 0.2500 mg | ORAL_TABLET | Freq: Two times a day (BID) | ORAL | 0 refills | Status: DC | PRN

## 2017-05-02 NOTE — Telephone Encounter (Signed)
Discussed with patient. Short term ativan. Advised no driving for 8 hours after use.   Discussed risks in the elderly- daughter is with her and can monitor fortunately. Use only half tablet

## 2017-05-02 NOTE — Addendum Note (Signed)
Addended by: Marin Olp on: 05/02/2017 04:15 PM   Modules accepted: Orders

## 2017-05-02 NOTE — Telephone Encounter (Signed)
Received phone call from patient and daughter Reuel Boom that her son committed suicide last night in [place], [place]. They are waiting for the other son to fly in form [place] today and they are leaving for [facility] tomorrow. Patient is understandably upset and is asking of she needs medication to help get through the next few weeks. She is asking for a return phone call from PCP.

## 2017-05-13 ENCOUNTER — Ambulatory Visit (INDEPENDENT_AMBULATORY_CARE_PROVIDER_SITE_OTHER): Payer: Medicare Other | Admitting: *Deleted

## 2017-05-13 DIAGNOSIS — Z23 Encounter for immunization: Secondary | ICD-10-CM | POA: Diagnosis not present

## 2017-05-16 ENCOUNTER — Encounter: Payer: Self-pay | Admitting: Family Medicine

## 2017-05-16 ENCOUNTER — Ambulatory Visit (INDEPENDENT_AMBULATORY_CARE_PROVIDER_SITE_OTHER): Payer: Medicare Other | Admitting: Family Medicine

## 2017-05-16 VITALS — BP 102/58 | HR 70 | Temp 97.7°F | Ht 68.0 in | Wt 175.0 lb

## 2017-05-16 DIAGNOSIS — J301 Allergic rhinitis due to pollen: Secondary | ICD-10-CM | POA: Diagnosis not present

## 2017-05-16 DIAGNOSIS — J00 Acute nasopharyngitis [common cold]: Secondary | ICD-10-CM | POA: Diagnosis not present

## 2017-05-16 DIAGNOSIS — F4321 Adjustment disorder with depressed mood: Secondary | ICD-10-CM | POA: Diagnosis not present

## 2017-05-16 DIAGNOSIS — F4320 Adjustment disorder, unspecified: Secondary | ICD-10-CM | POA: Insufficient documentation

## 2017-05-16 MED ORDER — FLUTICASONE PROPIONATE 50 MCG/ACT NA SUSP: 2.0000 | Freq: Every day | NASAL | 0 refills | Status: DC

## 2017-05-16 MED ORDER — BENZONATATE 100 MG PO CAPS: 100.0000 mg | ORAL_CAPSULE | Freq: Three times a day (TID) | ORAL | 0 refills | Status: DC | PRN

## 2017-05-16 NOTE — Progress Notes (Signed)
PCP: Marin Olp, MD  Subjective:  Kim Singh is a 76 y.o. year old very pleasant female patient who presents with Upper Respiratory infection symptoms including nasal congestion, sore throat, cough - slightly productive, rhinorrhea -started: 3 days ago (was recovering from a prior illness pretty well then got flu shot on Monday and started to feel worse), symptoms show no change since Monday- stable -previous treatments: mucinex -sick contacts/travel/risks: denies flu exposure.  -Hx of: allergies- denies  ROS-denies fever, SOB, NVD, tooth pain  Pertinent Past Medical History-  Patient Active Problem List   Diagnosis Date Noted  . Type II diabetes mellitus with neurological manifestations ([place]) 11/29/2013    Priority: High  . Diabetic neuropathy ([place]) 08/22/2015    Priority: Medium  . Depression 02/28/2015    Priority: Medium  . Dyslipidemia 11/29/2013    Priority: Medium  . Hypothyroidism 11/29/2013    Priority: Medium  . Essential hypertension 11/29/2013    Priority: Medium  . Former smoker 02/28/2015    Priority: Low  . History of breast cancer 11/30/2013    Priority: Low  . History of thyroid cancer 11/30/2013    Priority: Low  . Orthostatic hypotension 11/29/2013    Priority: Low   Medications- reviewed  Current Outpatient Prescriptions  Medication Sig Dispense Refill  . aspirin 81 MG tablet Take 81 mg by mouth daily.    Marland Kitchen CALCIUM-VITAMIN D PO Take 1 tablet by mouth 2 (two) times daily.     . citalopram (CELEXA) 40 MG tablet TAKE 1 TABLET DAILY 90 tablet 3  . ezetimibe (ZETIA) 10 MG tablet TAKE 1 TABLET DAILY 90 tablet 3  . levothyroxine (SYNTHROID, LEVOTHROID) 100 MCG tablet Take 1 tablet (100 mcg total) by mouth daily. 30 tablet 0  . LORazepam (ATIVAN) 0.5 MG tablet Take 0.5 tablets (0.25 mg total) by mouth 2 (two) times daily as needed for anxiety. 15 tablet 0  . metFORMIN (GLUCOPHAGE) 1000 MG tablet TAKE 1 TABLET DAILY WITH BREAKFAST 90 tablet 3  .  nadolol (CORGARD) 40 MG tablet TAKE 1 TABLET DAILY 90 tablet 3  . Omega-3-acid Ethyl Esters (OMACOR PO) Take 1 tablet by mouth 2 (two) times daily.     . quinapril (ACCUPRIL) 10 MG tablet TAKE 1 TABLET DAILY 90 tablet 3  . rosuvastatin (CRESTOR) 10 MG tablet TAKE 1 TABLET DAILY 90 tablet 3   Objective: BP (!) 102/58 (BP Location: Right Arm, Patient Position: Sitting, Cuff Size: Large)   Pulse 70   Temp 97.7 F (36.5 C) (Oral)   Ht 5\' 8"  (1.727 m)   Wt 175 lb (79.4 kg)   SpO2 91%   BMI 26.61 kg/m  Gen: NAD, resting comfortably HEENT: Turbinates pale but erythematous, TM normal, pharynx mildly erythematous with no tonsilar exudate or edema, no sinus tenderness CV: RRR no murmurs rubs or gallops Lungs: CTAB no crackles, wheeze, rhonchi Abdomen: soft/nontender/nondistended/normal bowel sounds.  Ext: no edema Skin: warm, dry, no rash Psych: saddened mood over loss of son (suicide)  Assessment/Plan:  Upper Respiratory infection and allergic rhinitis History and exam today are suggestive of viral infection most likely due to upper respiratory infection. Symptomatic treatment with: mucinex, tessalon. She also has some signs on exam of allergic symptoms- will add flonase which will help with rhinorrhea, nasal congestion  We discussed that we did not find any infection that had higher probability of being bacterial such as pneumonia or strep throat. Likely course of 1-2 weeks. Finally, we reviewed reasons to return to  care including if symptoms worsen or persist or new concerns arise- once again particularly shortness of breath or fever.  Grief reaction Lost son to suicide within 3 weeks. Spent time in counseling today with her. Discussed hospice grief counseling- declines. Does agree to see [facility].   Meds ordered this encounter  Medications  . benzonatate (TESSALON) 100 MG capsule    Sig: Take 1 capsule (100 mg total) by mouth 3 (three) times daily as needed for cough.     Dispense:  30 capsule    Refill:  0  . fluticasone (FLONASE) 50 MCG/ACT nasal spray    Sig: Place 2 sprays into both nostrils daily.    Dispense:  16 g    Refill:  0    Garret Reddish, MD

## 2017-05-16 NOTE — Assessment & Plan Note (Signed)
Lost son to suicide within 3 weeks. Spent time in counseling today with her. Discussed hospice grief counseling- declines. Does agree to see [facility].

## 2017-05-16 NOTE — Patient Instructions (Addendum)
o2  Upper Respiratory infection + some allergies History and exam today are suggestive of viral infection most likely due to upper respiratory infection. Symptomatic treatment with: mucinex, tessalon. She also has some signs on exam of allergic symptoms- will add flonase which will help with runny nose, nasal congestion  We discussed that we did not find any infection that had higher probability of being bacterial such as pneumonia or strep throat. Likely course of 1-2 weeks. Finally, we reviewed reasons to return to care including if symptoms worsen or persist or new concerns arise- once again particularly shortness of breath or fever.

## 2017-05-20 ENCOUNTER — Encounter: Payer: Self-pay | Admitting: Family Medicine

## 2017-05-20 ENCOUNTER — Ambulatory Visit (INDEPENDENT_AMBULATORY_CARE_PROVIDER_SITE_OTHER): Payer: Medicare Other | Admitting: Family Medicine

## 2017-05-20 VITALS — BP 116/70 | HR 75 | Temp 98.3°F | Ht 68.0 in | Wt 174.0 lb

## 2017-05-20 DIAGNOSIS — B9689 Other specified bacterial agents as the cause of diseases classified elsewhere: Secondary | ICD-10-CM | POA: Diagnosis not present

## 2017-05-20 DIAGNOSIS — J329 Chronic sinusitis, unspecified: Secondary | ICD-10-CM

## 2017-05-20 MED ORDER — DOXYCYCLINE HYCLATE 100 MG PO TABS: 100.0000 mg | ORAL_TABLET | Freq: Two times a day (BID) | ORAL | 0 refills | Status: AC

## 2017-05-20 NOTE — Progress Notes (Signed)
Subjective:  Kim Singh is a 76 y.o. year old very pleasant female patient who presents for/with See problem oriented charting ROS-no fever or shortness of breath.  Does have increasing sinus pressure.  Continued sinus drainage.  Past Medical History-  Patient Active Problem List   Diagnosis Date Noted  . Type II diabetes mellitus with neurological manifestations ([place]) 11/29/2013    Priority: High  . Diabetic neuropathy ([place]) 08/22/2015    Priority: Medium  . Depression 02/28/2015    Priority: Medium  . Dyslipidemia 11/29/2013    Priority: Medium  . Hypothyroidism 11/29/2013    Priority: Medium  . Essential hypertension 11/29/2013    Priority: Medium  . Former smoker 02/28/2015    Priority: Low  . History of breast cancer 11/30/2013    Priority: Low  . History of thyroid cancer 11/30/2013    Priority: Low  . Orthostatic hypotension 11/29/2013    Priority: Low  . Grief reaction 05/16/2017    Medications- reviewed and updated Current Outpatient Prescriptions  Medication Sig Dispense Refill  . aspirin 81 MG tablet Take 81 mg by mouth daily.    . benzonatate (TESSALON) 100 MG capsule Take 1 capsule (100 mg total) by mouth 3 (three) times daily as needed for cough. 30 capsule 0  . CALCIUM-VITAMIN D PO Take 1 tablet by mouth 2 (two) times daily.     . citalopram (CELEXA) 40 MG tablet TAKE 1 TABLET DAILY 90 tablet 3  . ezetimibe (ZETIA) 10 MG tablet TAKE 1 TABLET DAILY 90 tablet 3  . fluticasone (FLONASE) 50 MCG/ACT nasal spray Place 2 sprays into both nostrils daily. 16 g 0  . levothyroxine (SYNTHROID, LEVOTHROID) 100 MCG tablet Take 1 tablet (100 mcg total) by mouth daily. 30 tablet 0  . LORazepam (ATIVAN) 0.5 MG tablet Take 0.5 tablets (0.25 mg total) by mouth 2 (two) times daily as needed for anxiety. 15 tablet 0  . metFORMIN (GLUCOPHAGE) 1000 MG tablet TAKE 1 TABLET DAILY WITH BREAKFAST 90 tablet 3  . nadolol (CORGARD) 40 MG tablet TAKE 1 TABLET DAILY 90 tablet 3  .  Omega-3-acid Ethyl Esters (OMACOR PO) Take 1 tablet by mouth 2 (two) times daily.     . quinapril (ACCUPRIL) 10 MG tablet TAKE 1 TABLET DAILY 90 tablet 3  . rosuvastatin (CRESTOR) 10 MG tablet TAKE 1 TABLET DAILY 90 tablet 3  . doxycycline (VIBRA-TABS) 100 MG tablet Take 1 tablet (100 mg total) by mouth 2 (two) times daily. 14 tablet 0   No current facility-administered medications for this visit.     Objective: BP 116/70 (BP Location: Left Arm, Patient Position: Sitting, Cuff Size: Large)   Pulse 75   Temp 98.3 F (36.8 C) (Oral)   Ht 5\' 8"  (1.727 m)   Wt 174 lb (78.9 kg)   SpO2 93%   BMI 26.46 kg/m  Gen: Appears far more fatigued than last time, loud wet deep cough Nares less edematous than previous, yellow discharge in nares noted.  Tympanic membranes normal bilaterally.  Pharynx mildly erythematous with some drainage.  Bilateral frontal sinus tenderness. CV: RRR no murmurs rubs or gallops Lungs: CTAB no crackles, wheeze, rhonchi Ext: no edema  Assessment/Plan:  Bacterial sinusitis S: Patient seen 4 days ago after several days of cough, congestion, runny nose.  She was treated as if she had an upper respiratory infection and given return precautions.  There was also concern for an allergic element and she was started on Flonase.  Despite these treatments at  home, her cough has worsened and now sounds deeper.  She has more sinus pressure particularly in her frontal sinuses.  She has had progressive fatigue and continued sinus drainage.  Mucinex helps some with loosening congestion.  Tessalon did not help her cough very much A/P: I am concerned by patient's double sickening.  Her age and comorbidities including diabetes, hypertension, hyperlipidemia make her a higher risk patient.  With the worsening sinus pressure I am concerned about a bacterial sinusitis.  Her cough is rather wet though her lungs are clear and the cough is actually nonproductive.  I am also concerned about potential  bronchitis.  We discussed trial of doxycycline which would give some typical and atypical coverage for her sinusitis.  She has failed azithromycin with similar in the past. we discussed if she fails to improve this may be viral bronchitis and antibiotics simply will not help her.  We did discuss there was a 40-1 chance if she had bronchitis that the doxycycline may prevent progression to pneumonia.  We discussed risk of antibiotic overuse as well as use including C. difficile.  Overall I think the benefits outweigh the risks trial antibiotic.  See AVS for further instructions  Future Appointments Date Time Provider [place]  06/04/2017 11:00 AM [facility] LAB [facility] None  10/22/2017 11:00 AM Marin Olp, MD [facility] None  04/24/2018 10:00 AM Williemae Area, RN [facility] None  04/24/2018 11:00 AM Yong Channel Brayton Mars, MD [facility] None   Meds ordered this encounter  Medications  . doxycycline (VIBRA-TABS) 100 MG tablet    Sig: Take 1 tablet (100 mg total) by mouth 2 (two) times daily.    Dispense:  14 tablet    Refill:  0    Return precautions advised particularly fever or shortness of breath Garret Reddish, MD

## 2017-05-20 NOTE — Patient Instructions (Addendum)
Mucinex to loosen congestion Doxycycline as antibiotic to cover potential bacterial sinusitis [place]ym for cough  If not improved within a week or symptoms worsen like fever or shortness of breath- want to see you back  Health Maintenance Due  Topic Date Due  . OPHTHALMOLOGY EXAM  11/07/2016

## 2017-05-22 ENCOUNTER — Other Ambulatory Visit: Payer: Self-pay | Admitting: Family Medicine

## 2017-05-23 ENCOUNTER — Ambulatory Visit (INDEPENDENT_AMBULATORY_CARE_PROVIDER_SITE_OTHER): Payer: Medicare Other | Admitting: Family Medicine

## 2017-05-23 ENCOUNTER — Ambulatory Visit (INDEPENDENT_AMBULATORY_CARE_PROVIDER_SITE_OTHER): Payer: Medicare Other

## 2017-05-23 ENCOUNTER — Encounter: Payer: Self-pay | Admitting: Family Medicine

## 2017-05-23 VITALS — BP 120/84 | HR 67 | Temp 98.1°F | Ht 68.0 in | Wt 174.6 lb

## 2017-05-23 DIAGNOSIS — R062 Wheezing: Secondary | ICD-10-CM

## 2017-05-23 DIAGNOSIS — R05 Cough: Secondary | ICD-10-CM | POA: Diagnosis not present

## 2017-05-23 DIAGNOSIS — R059 Cough, unspecified: Secondary | ICD-10-CM

## 2017-05-23 MED ORDER — METHYLPREDNISOLONE ACETATE 40 MG/ML IJ SUSP
40.0000 mg | Freq: Once | INTRAMUSCULAR | Status: AC
  Administered 2017-05-23: 40 mg via INTRAMUSCULAR

## 2017-05-23 MED ORDER — HYDROCODONE-HOMATROPINE 5-1.5 MG/5ML PO SYRP: 2.5000 mL | ORAL_SOLUTION | Freq: Four times a day (QID) | ORAL | 0 refills | Status: DC | PRN

## 2017-05-23 NOTE — Patient Instructions (Signed)
Try stronger cough medicine- hydrocodone. I would try at night first. If someone home in the day you can try it as well. Be careful getting up when you have this in your system. Can cause falls or injury. May cause stomach upset since you had that with codeine. Try to have some food on your stomach.   Depo medrol 40mg  injection today for antiinflammatory properties  Let me know immediately if cough or shortness of breath  I suspect this is bronchitis and will last 4-6 weeks :(

## 2017-05-23 NOTE — Progress Notes (Signed)
Subjective:  Kim Singh is a 76 y.o. year old very pleasant female patient who presents for/with See problem oriented charting ROS- cough, congestion continue, no fever. Fatigue continues.    Past Medical History-  Patient Active Problem List   Diagnosis Date Noted  . Type II diabetes mellitus with neurological manifestations ([place]) 11/29/2013    Priority: High  . Diabetic neuropathy ([place]) 08/22/2015    Priority: Medium  . Depression 02/28/2015    Priority: Medium  . Dyslipidemia 11/29/2013    Priority: Medium  . Hypothyroidism 11/29/2013    Priority: Medium  . Essential hypertension 11/29/2013    Priority: Medium  . Former smoker 02/28/2015    Priority: Low  . History of breast cancer 11/30/2013    Priority: Low  . History of thyroid cancer 11/30/2013    Priority: Low  . Orthostatic hypotension 11/29/2013    Priority: Low  . Grief reaction 05/16/2017    Medications- reviewed and updated Current Outpatient Prescriptions  Medication Sig Dispense Refill  . aspirin 81 MG tablet Take 81 mg by mouth daily.    . benzonatate (TESSALON) 100 MG capsule Take 1 capsule (100 mg total) by mouth 3 (three) times daily as needed for cough. 30 capsule 0  . CALCIUM-VITAMIN D PO Take 1 tablet by mouth 2 (two) times daily.     . citalopram (CELEXA) 40 MG tablet TAKE 1 TABLET DAILY 90 tablet 3  . doxycycline (VIBRA-TABS) 100 MG tablet Take 1 tablet (100 mg total) by mouth 2 (two) times daily. 14 tablet 0  . ezetimibe (ZETIA) 10 MG tablet TAKE 1 TABLET DAILY 90 tablet 3  . fluticasone (FLONASE) 50 MCG/ACT nasal spray Place 2 sprays into both nostrils daily. 16 g 0  . levothyroxine (SYNTHROID, LEVOTHROID) 100 MCG tablet TAKE 1 TABLET BY MOUTH DAILY 30 tablet 0  . LORazepam (ATIVAN) 0.5 MG tablet Take 0.5 tablets (0.25 mg total) by mouth 2 (two) times daily as needed for anxiety. 15 tablet 0  . metFORMIN (GLUCOPHAGE) 1000 MG tablet TAKE 1 TABLET DAILY WITH BREAKFAST 90 tablet 3  . nadolol  (CORGARD) 40 MG tablet TAKE 1 TABLET DAILY 90 tablet 3  . Omega-3-acid Ethyl Esters (OMACOR PO) Take 1 tablet by mouth 2 (two) times daily.     . quinapril (ACCUPRIL) 10 MG tablet TAKE 1 TABLET DAILY 90 tablet 3  . rosuvastatin (CRESTOR) 10 MG tablet TAKE 1 TABLET DAILY 90 tablet 3  . HYDROcodone-homatropine (HYCODAN) 5-1.5 MG/5ML syrup Take 2.5-5 mLs by mouth every 6 (six) hours as needed for cough (start with 2.5 mL). 120 mL 0   No current facility-administered medications for this visit.     Objective: BP 120/84 (BP Location: Left Arm, Patient Position: Sitting, Cuff Size: Large)   Pulse 67   Temp 98.1 F (36.7 C) (Oral)   Ht 5\' 8"  (1.727 m)   Wt 174 lb 9.6 oz (79.2 kg)   SpO2 95%   BMI 26.55 kg/m  Gen: NAD, resting comfortably, appears fatigued but improved Sinus tenderness slightly improved, nasal congestion slightly improved. Tm normal. Oropharynx normal CV: RRR no murmurs rubs or gallops Lungs: intermittent rhonchi and wheeze. No crackles. Ext: no edema Skin: warm, dry  Dg Chest 2 View  Result Date: 05/23/2017 CLINICAL DATA:  Nonproductive cough for 1 week.Wheezing. EXAM: CHEST  2 VIEW COMPARISON:  07/22/2016. FINDINGS: Stable cardiomediastinal silhouette. Borderline cardiac enlargement with calcified tortuous aorta. Peribronchial thickening consistent with the diagnosis of bronchitis. Mild scarring RIGHT base. No consolidation  or edema. No effusion or pneumothorax. Degenerative scoliosis convex RIGHT midthoracic region. No acute osseous findings. IMPRESSION: Stable chest.  Mild bronchitic change.  No consolidation or edema. Electronically Signed   By: Staci Righter M.D.   On: 05/23/2017 15:57   Assessment/Plan:  Cough - Plan: DG Chest 2 View, methylPREDNISolone acetate (DEPO-MEDROL) injection 40 mg  Wheeze - Plan: DG Chest 2 View S: Patient was seen on 05/16/17 and thought to have URI, seen again on 05/20/17 and had increased sinus pressure and nasal congestion and started  on doxycycline 100mg  BID for potential sinusitis with cross coverage for potential lung infection such as bronchitis or pneumonia - as told patient I was concerned about bronchitis as well (though we discussed this is often viral). She has had some improved fatigue with this regimen. She has had continued severe cough though slightly better- having trouble getting any sleep despite delsym or tessalon.  A/P: Discussed this is likely bronchitis with new lung findings of rhonchi and wheeze. She wanted to rule out pneumonia and x-ray was done which shows bronchitic changes. See AVS plan below.  Patient Instructions  Try stronger cough medicine- hydrocodone. I would try at night first. If someone home in the day you can try it as well. Be careful getting up when you have this in your system. Can cause falls or injury. May cause stomach upset since you had that with codeine. Try to have some food on your stomach.   Depo medrol 40mg  injection today for antiinflammatory properties  Let me know immediately if cough or shortness of breath  I suspect this is bronchitis and will last 4-6 weeks :(  Future Appointments Date Time Provider [place]  06/04/2017 11:00 AM [facility] None  10/22/2017 11:00 AM Marin Olp, MD [facility] None  04/24/2018 10:00 AM Williemae Area, RN [facility] None  04/24/2018 11:00 AM Yong Channel Brayton Mars, MD [facility] None   Orders Placed This Encounter  Procedures  . DG Chest 2 View    Standing Status:   Future    Number of Occurrences:   1    Standing Expiration Date:   07/23/2018    Order Specific Question:   Reason for Exam (SYMPTOM  OR DIAGNOSIS REQUIRED)    Answer:   cough, wheeze. rhonchi. suspect bronchitis. rule out pneumonia    Order Specific Question:   Preferred imaging location?    Answer:   [facility] ordered this encounter  Medications  . HYDROcodone-homatropine (HYCODAN) 5-1.5 MG/5ML syrup    Sig: Take 2.5-5 mLs by  mouth every 6 (six) hours as needed for cough (start with 2.5 mL).    Dispense:  120 mL    Refill:  0  . methylPREDNISolone acetate (DEPO-MEDROL) injection 40 mg   Return precautions advised.  Garret Reddish, MD

## 2017-05-28 ENCOUNTER — Telehealth: Payer: Self-pay | Admitting: Family Medicine

## 2017-05-28 NOTE — Telephone Encounter (Signed)
Patient called in reference to wanting to let Dr. Yong Channel and Roselyn Reef know that she was feeling much better! No further questions from patient.

## 2017-05-28 NOTE — Telephone Encounter (Signed)
LVM thanking for updating and stating glad she is feeling better

## 2017-05-31 ENCOUNTER — Other Ambulatory Visit: Payer: Self-pay

## 2017-05-31 ENCOUNTER — Telehealth: Payer: Self-pay

## 2017-05-31 DIAGNOSIS — E039 Hypothyroidism, unspecified: Secondary | ICD-10-CM

## 2017-05-31 NOTE — Telephone Encounter (Signed)
Pt on lab schedule with no future orders. Pt coming 06/04/17. Please place future orders.  Thank you.

## 2017-05-31 NOTE — Telephone Encounter (Signed)
Order for TSH entered.

## 2017-06-04 ENCOUNTER — Other Ambulatory Visit (INDEPENDENT_AMBULATORY_CARE_PROVIDER_SITE_OTHER): Payer: Medicare Other

## 2017-06-04 DIAGNOSIS — E039 Hypothyroidism, unspecified: Secondary | ICD-10-CM | POA: Diagnosis not present

## 2017-06-04 LAB — TSH: TSH: 1.09 u[IU]/mL (ref 0.35–4.50)

## 2017-06-26 ENCOUNTER — Other Ambulatory Visit: Payer: Self-pay | Admitting: Family Medicine

## 2017-07-11 ENCOUNTER — Ambulatory Visit (INDEPENDENT_AMBULATORY_CARE_PROVIDER_SITE_OTHER): Payer: Medicare Other | Admitting: Psychology

## 2017-07-11 ENCOUNTER — Other Ambulatory Visit: Payer: Self-pay

## 2017-07-11 DIAGNOSIS — F4321 Adjustment disorder with depressed mood: Secondary | ICD-10-CM | POA: Diagnosis not present

## 2017-07-11 MED ORDER — LEVOTHYROXINE SODIUM 100 MCG PO TABS: 100.0000 ug | ORAL_TABLET | Freq: Every day | ORAL | 3 refills | Status: DC

## 2017-07-29 ENCOUNTER — Ambulatory Visit (INDEPENDENT_AMBULATORY_CARE_PROVIDER_SITE_OTHER): Payer: Medicare Other | Admitting: Psychology

## 2017-07-29 DIAGNOSIS — F4321 Adjustment disorder with depressed mood: Secondary | ICD-10-CM | POA: Diagnosis not present

## 2017-08-08 ENCOUNTER — Ambulatory Visit (INDEPENDENT_AMBULATORY_CARE_PROVIDER_SITE_OTHER): Payer: Medicare Other | Admitting: Podiatry

## 2017-08-08 ENCOUNTER — Encounter: Payer: Self-pay | Admitting: Podiatry

## 2017-08-08 DIAGNOSIS — L601 Onycholysis: Secondary | ICD-10-CM

## 2017-08-08 DIAGNOSIS — B351 Tinea unguium: Secondary | ICD-10-CM

## 2017-08-08 NOTE — Progress Notes (Signed)
Subjective:    Patient ID: Kim Singh, female    DOB: 1940-09-26, 77 y.o.   MRN: [id number]  HPI  Chief Complaint  Patient presents with  . Nail Problem    (np) black spot on right great toenail   77 y.o. female presents with the above complaint.  Ports a spot that is black in color on her right great toenail.  Reports that her nails somewhat lifting and notices that there might be another nail growing underneath it.  Reports pain from the nail.  Denies other issues.  States she may have dropped something on her nail.  Past Medical History:  Diagnosis Date  . Breast cancer ([place]) 2001   Left  . Diabetes mellitus type 2, uncomplicated ([place])   . Personal history of chemotherapy   . Personal history of radiation therapy   . Pure hypercholesterolemia   . Unspecified disorder of thyroid   . Unspecified essential hypertension    Past Surgical History:  Procedure Laterality Date  . BREAST EXCISIONAL BIOPSY Left   . BREAST LUMPECTOMY Left 2001  . THYROIDECTOMY     total    Current Outpatient Medications:  .  aspirin 81 MG tablet, Take 81 mg by mouth daily., Disp: , Rfl:  .  CALCIUM-VITAMIN D PO, Take 1 tablet by mouth 2 (two) times daily. , Disp: , Rfl:  .  citalopram (CELEXA) 40 MG tablet, TAKE 1 TABLET DAILY, Disp: 90 tablet, Rfl: 3 .  ezetimibe (ZETIA) 10 MG tablet, TAKE 1 TABLET DAILY, Disp: 90 tablet, Rfl: 3 .  levothyroxine (SYNTHROID, LEVOTHROID) 100 MCG tablet, Take 1 tablet (100 mcg total) by mouth daily., Disp: 90 tablet, Rfl: 3 .  LORazepam (ATIVAN) 0.5 MG tablet, Take 0.5 tablets (0.25 mg total) by mouth 2 (two) times daily as needed for anxiety., Disp: 15 tablet, Rfl: 0 .  metFORMIN (GLUCOPHAGE) 1000 MG tablet, TAKE 1 TABLET DAILY WITH BREAKFAST, Disp: 90 tablet, Rfl: 3 .  nadolol (CORGARD) 40 MG tablet, TAKE 1 TABLET DAILY, Disp: 90 tablet, Rfl: 3 .  Omega-3-acid Ethyl Esters (OMACOR PO), Take 1 tablet by mouth 2 (two) times daily. , Disp: , Rfl:  .  quinapril  (ACCUPRIL) 10 MG tablet, TAKE 1 TABLET DAILY, Disp: 90 tablet, Rfl: 3 .  rosuvastatin (CRESTOR) 10 MG tablet, TAKE 1 TABLET DAILY, Disp: 90 tablet, Rfl: 3 .  benzonatate (TESSALON) 100 MG capsule, Take 1 capsule (100 mg total) by mouth 3 (three) times daily as needed for cough. (Patient not taking: Reported on 08/08/2017), Disp: 30 capsule, Rfl: 0 .  fluticasone (FLONASE) 50 MCG/ACT nasal spray, Place 2 sprays into both nostrils daily. (Patient not taking: Reported on 08/08/2017), Disp: 16 g, Rfl: 0 .  HYDROcodone-homatropine (HYCODAN) 5-1.5 MG/5ML syrup, Take 2.5-5 mLs by mouth every 6 (six) hours as needed for cough (start with 2.5 mL). (Patient not taking: Reported on 08/08/2017), Disp: 120 mL, Rfl: 0  Allergies  Allergen Reactions  . Codeine Other (See Comments)    GI upset   Review of Systems  Musculoskeletal: Positive for arthralgias and myalgias.      Objective:   Physical Exam There were no vitals filed for this visit. General AA&O x3. Normal mood and affect.  Vascular Dorsalis pedis and posterior tibial pulses  present 2+ bilaterally  Capillary refill normal to all digits. Pedal hair growth normal.  Neurologic Epicritic sensation grossly present.  Dermatologic No open lesions. Interspaces clear of maceration. Right hallux nail with onycholysis distally transverse crack,  subungual debris  Orthopedic: MMT 5/5 in dorsiflexion, plantarflexion, inversion, and eversion. Normal joint ROM without pain or crepitus.     Assessment & Plan:  Patient was evaluated and treated all questions answered  Right hallux nail onycholysis -Nail debrided.  Likely traumatic onycholysis with subungual hemorrhage.  Patient educated on return precautions.  Advised the nail may never grow normally.  Procedure: Nail Debridement Rationale: pain Type of Debridement: manual, sharp debridement. Instrumentation: Nail nipper, rotary burr. Number of Nails: 1  Return if symptoms worsen or fail to improve.

## 2017-08-12 ENCOUNTER — Ambulatory Visit (INDEPENDENT_AMBULATORY_CARE_PROVIDER_SITE_OTHER): Payer: Medicare Other | Admitting: Psychology

## 2017-08-12 DIAGNOSIS — F4321 Adjustment disorder with depressed mood: Secondary | ICD-10-CM | POA: Diagnosis not present

## 2017-08-22 DIAGNOSIS — E119 Type 2 diabetes mellitus without complications: Secondary | ICD-10-CM | POA: Diagnosis not present

## 2017-08-22 DIAGNOSIS — H26492 Other secondary cataract, left eye: Secondary | ICD-10-CM | POA: Diagnosis not present

## 2017-08-22 LAB — HM DIABETES EYE EXAM

## 2017-08-26 ENCOUNTER — Ambulatory Visit (INDEPENDENT_AMBULATORY_CARE_PROVIDER_SITE_OTHER): Payer: Medicare Other | Admitting: Psychology

## 2017-08-26 DIAGNOSIS — F4321 Adjustment disorder with depressed mood: Secondary | ICD-10-CM

## 2017-08-30 ENCOUNTER — Encounter: Payer: Self-pay | Admitting: Family Medicine

## 2017-09-13 ENCOUNTER — Other Ambulatory Visit: Payer: Self-pay | Admitting: Family Medicine

## 2017-09-13 ENCOUNTER — Telehealth: Payer: Self-pay | Admitting: Radiology

## 2017-09-13 NOTE — Telephone Encounter (Signed)
Noted. Refills completed as requested

## 2017-09-13 NOTE — Telephone Encounter (Signed)
Pt wants Roselyn Reef to know that her RX provider will contact her about refills.

## 2017-09-26 ENCOUNTER — Ambulatory Visit (INDEPENDENT_AMBULATORY_CARE_PROVIDER_SITE_OTHER): Payer: Medicare Other | Admitting: Psychology

## 2017-09-26 DIAGNOSIS — F4321 Adjustment disorder with depressed mood: Secondary | ICD-10-CM

## 2017-09-28 ENCOUNTER — Other Ambulatory Visit: Payer: Self-pay | Admitting: Family Medicine

## 2017-10-22 ENCOUNTER — Encounter: Payer: Self-pay | Admitting: Family Medicine

## 2017-10-22 ENCOUNTER — Ambulatory Visit (INDEPENDENT_AMBULATORY_CARE_PROVIDER_SITE_OTHER): Payer: Medicare Other | Admitting: Family Medicine

## 2017-10-22 VITALS — BP 100/70 | HR 67 | Temp 97.4°F | Ht 68.0 in | Wt 174.6 lb

## 2017-10-22 DIAGNOSIS — E1149 Type 2 diabetes mellitus with other diabetic neurological complication: Secondary | ICD-10-CM

## 2017-10-22 DIAGNOSIS — E785 Hyperlipidemia, unspecified: Secondary | ICD-10-CM | POA: Diagnosis not present

## 2017-10-22 DIAGNOSIS — R82998 Other abnormal findings in urine: Secondary | ICD-10-CM

## 2017-10-22 DIAGNOSIS — F325 Major depressive disorder, single episode, in full remission: Secondary | ICD-10-CM | POA: Diagnosis not present

## 2017-10-22 DIAGNOSIS — E1142 Type 2 diabetes mellitus with diabetic polyneuropathy: Secondary | ICD-10-CM

## 2017-10-22 DIAGNOSIS — I10 Essential (primary) hypertension: Secondary | ICD-10-CM

## 2017-10-22 DIAGNOSIS — E039 Hypothyroidism, unspecified: Secondary | ICD-10-CM | POA: Diagnosis not present

## 2017-10-22 LAB — COMPREHENSIVE METABOLIC PANEL
ALBUMIN: 4 g/dL (ref 3.5–5.2)
ALK PHOS: 55 U/L (ref 39–117)
ALT: 16 U/L (ref 0–35)
AST: 21 U/L (ref 0–37)
BUN: 18 mg/dL (ref 6–23)
CALCIUM: 9.9 mg/dL (ref 8.4–10.5)
CHLORIDE: 99 meq/L (ref 96–112)
CO2: 34 mEq/L — ABNORMAL HIGH (ref 19–32)
Creatinine, Ser: 1.02 mg/dL (ref 0.40–1.20)
GFR: 55.57 mL/min — AB (ref >60.00)
Glucose, Bld: 84 mg/dL (ref 70–99)
POTASSIUM: 4 meq/L (ref 3.5–5.1)
Sodium: 138 mEq/L (ref 135–145)
TOTAL PROTEIN: 7.1 g/dL (ref 6.0–8.3)
Total Bilirubin: 1.3 mg/dL — ABNORMAL HIGH (ref 0.2–1.2)

## 2017-10-22 LAB — CBC
HCT: 42.4 % (ref 36.0–46.0)
Hemoglobin: 14.2 g/dL (ref 12.0–15.0)
MCHC: 33.6 g/dL (ref 30.0–36.0)
MCV: 93.8 fl (ref 78.0–100.0)
Platelets: 212 10*3/uL (ref 150.0–400.0)
RBC: 4.52 Mil/uL (ref 3.87–5.11)
RDW: 13.3 % (ref 11.5–15.5)
WBC: 8.4 10*3/uL (ref 4.0–10.5)

## 2017-10-22 LAB — POC URINALSYSI DIPSTICK (AUTOMATED)
GLUCOSE UA: NEGATIVE
NITRITE UA: POSITIVE
PH UA: 5.5 (ref 5.0–8.0)
SPEC GRAV UA: 1.025 (ref 1.010–1.025)
UROBILINOGEN UA: 1 U/dL

## 2017-10-22 LAB — TSH: TSH: 1.03 u[IU]/mL (ref 0.35–4.50)

## 2017-10-22 LAB — HEMOGLOBIN A1C: HEMOGLOBIN A1C: 6.4 % (ref 4.6–6.5)

## 2017-10-22 MED ORDER — FLUTICASONE PROPIONATE 50 MCG/ACT NA SUSP: 2.0000 | Freq: Every day | NASAL | 5 refills | Status: DC

## 2017-10-22 NOTE — Assessment & Plan Note (Signed)
Still has some pain from this in feet- does not want to take medication though

## 2017-10-22 NOTE — Assessment & Plan Note (Signed)
S:  controlled. On metformin 1g Daily Lab Results  Component Value Date   HGBA1C 6.4 04/23/2017   HGBA1C 5.9 (H) 10/18/2016   HGBA1C 6.6 (H) 04/17/2016   A/P: update a1c

## 2017-10-22 NOTE — Assessment & Plan Note (Signed)
S: well controlled on zetia 10mg , crestor 10mg  daily A/P: last LDL under 50 within 6 months- continue current medicine and update full labs next time fasting. Discussed can stop aspirin for primary prevention

## 2017-10-22 NOTE — Assessment & Plan Note (Signed)
S: controlled on nadolol 40mg , quinapril10mg  BP Readings from Last 3 Encounters:  10/22/17 100/70  05/23/17 120/84  05/20/17 116/70  A/P: We discussed blood pressure goal of <140/90. Continue current meds:  Running lower than normal for her but feels ok today- no lightheadedness or dizziness- will monitor

## 2017-10-22 NOTE — Addendum Note (Signed)
Addended by: Mariam Dollar, Roselyn Reef M on: 10/22/2017 04:03 PM   Modules accepted: Orders

## 2017-10-22 NOTE — Addendum Note (Signed)
Addended by: Frutoso Chase A on: 10/22/2017 04:04 PM   Modules accepted: Orders

## 2017-10-22 NOTE — Assessment & Plan Note (Signed)
S: Lab Results  Component Value Date   TSH 1.09 06/04/2017   On thyroid medication-controlled on levothyroxine 100 mcg A/P: continue current medications

## 2017-10-22 NOTE — Addendum Note (Signed)
Addended by: Frutoso Chase A on: 10/22/2017 11:45 AM   Modules accepted: Orders

## 2017-10-22 NOTE — Assessment & Plan Note (Signed)
S: PHQ9 of 5 today which is reasonable. Remains on celexa 79m. Big trigger here was loss of son to suicide- she is still in grieving process so hard to tease out this vs. Depression.   Has met with LTrey Paula they have stopped their meetings at this point- found it beneficial.  A/P: doing well- continue current medicines

## 2017-10-22 NOTE — Patient Instructions (Addendum)
Please stop by lab before you go  Lets keep our October visit- can come fasting if you want to update cholesterol. Would want to see you sooner if a1c above 7

## 2017-10-22 NOTE — Progress Notes (Signed)
Subjective:  Kim Singh is a 77 y.o. year old very pleasant female patient who presents for/with See problem oriented charting ROS- No chest pain or shortness of breath. No headache or blurry vision. No lightheadedness   Past Medical History-  Patient Active Problem List   Diagnosis Date Noted  . Type II diabetes mellitus with neurological manifestations ([place]) 11/29/2013    Priority: High  . Diabetic neuropathy ([place]) 08/22/2015    Priority: Medium  . Depression 02/28/2015    Priority: Medium  . Dyslipidemia 11/29/2013    Priority: Medium  . Hypothyroidism 11/29/2013    Priority: Medium  . Essential hypertension 11/29/2013    Priority: Medium  . Former smoker 02/28/2015    Priority: Low  . History of breast cancer 11/30/2013    Priority: Low  . History of thyroid cancer 11/30/2013    Priority: Low  . Orthostatic hypotension 11/29/2013    Priority: Low  . Grief reaction 05/16/2017    Medications- reviewed and updated Current Outpatient Medications  Medication Sig Dispense Refill  . aspirin 81 MG tablet Take 81 mg by mouth daily.    Marland Kitchen CALCIUM-VITAMIN D PO Take 1 tablet by mouth 2 (two) times daily.     . citalopram (CELEXA) 40 MG tablet TAKE 1 TABLET DAILY 90 tablet 3  . ezetimibe (ZETIA) 10 MG tablet TAKE 1 TABLET DAILY 90 tablet 3  . levothyroxine (SYNTHROID, LEVOTHROID) 100 MCG tablet Take 1 tablet (100 mcg total) by mouth daily. 90 tablet 3  . metFORMIN (GLUCOPHAGE) 1000 MG tablet TAKE 1 TABLET DAILY WITH BREAKFAST 90 tablet 3  . nadolol (CORGARD) 40 MG tablet TAKE 1 TABLET DAILY 90 tablet 3  . Omega-3-acid Ethyl Esters (OMACOR PO) Take 1 tablet by mouth 2 (two) times daily.     . quinapril (ACCUPRIL) 10 MG tablet TAKE 1 TABLET DAILY 90 tablet 3  . rosuvastatin (CRESTOR) 10 MG tablet TAKE 1 TABLET DAILY 90 tablet 3  . fluticasone (FLONASE) 50 MCG/ACT nasal spray Place 2 sprays into both nostrils daily. 16 g 5   Objective: BP 100/70   Pulse 67   Temp (!) 97.4 F  (36.3 C) (Oral)   Ht 5' 8" (1.727 m)   Wt 174 lb 9.6 oz (79.2 kg)   SpO2 95%   BMI 26.55 kg/m  Gen: NAD, resting comfortably CV: RRR no murmurs rubs or gallops Lungs: CTAB no crackles, wheeze, rhonchi Abdomen: soft/nontender/nondistended/normal bowel sounds.  Ext: trace edema Skin: warm, dry Neuro: normal gait  Assessment/Plan:  Depression S: PHQ9 of 5 today which is reasonable. Remains on celexa 73m. Big trigger here was loss of son to suicide- she is still in grieving process so hard to tease out this vs. Depression.   Has met with LTrey Paula they have stopped their meetings at this point- found it beneficial.  A/P: doing well- continue current medicines  Type II diabetes mellitus with neurological manifestations ([place] S:  controlled. On metformin 1g Daily Lab Results  Component Value Date   HGBA1C 6.4 04/23/2017   HGBA1C 5.9 (H) 10/18/2016   HGBA1C 6.6 (H) 04/17/2016   A/P: update a1c  Dyslipidemia S: well controlled on zetia 1105m crestor 1073maily A/P: last LDL under 50 within 6 months- continue current medicine and update full labs next time fasting. Discussed can stop aspirin for primary prevention   Hypothyroidism S: Lab Results  Component Value Date   TSH 1.09 06/04/2017   On thyroid medication-controlled on levothyroxine 100 mcg A/P: continue current  medications  Essential hypertension S: controlled on nadolol 9m, quinapril142mBP Readings from Last 3 Encounters:  10/22/17 100/70  05/23/17 120/84  05/20/17 116/70  A/P: We discussed blood pressure goal of <140/90. Continue current meds:  Running lower than normal for her but feels ok today- no lightheadedness or dizziness- will monitor  Di[place] ([place] has some pain from this in feet- does not want to take medication though  Future Appointments  Date Time Provider [place]10/09/2017 10:00 AM DrWilliemae AreaRN [facility]  04/24/2018 11:00 AM HuMarin Olp MD [facility]   Lab/Order associations: Major depressive disorder with single episode, in full remission (HCJay Type II diabetes mellitus with neurological manifestations ([place]- Plan: CBC, Comprehensive metabolic panel, Hemoglobin A1c, POCT Urinalysis Dipstick (Automated)  Dyslipidemia - Plan: POCT Urinalysis Dipstick (Automated)  Hypothyroidism, unspecified type - Plan: TSH  Essential hypertension  Meds ordered this encounter  Medications  . fluticasone (FLONASE) 50 MCG/ACT nasal spray    Sig: Place 2 sprays into both nostrils daily.    Dispense:  16 g    Refill:  5    Return precautions advised.  StGarret ReddishMD

## 2017-10-23 ENCOUNTER — Telehealth: Payer: Self-pay

## 2017-10-23 ENCOUNTER — Telehealth: Payer: Self-pay | Admitting: Family Medicine

## 2017-10-23 LAB — URINALYSIS, MICROSCOPIC ONLY

## 2017-10-23 LAB — URINE CULTURE
MICRO NUMBER:: 90406452
SPECIMEN QUALITY: ADEQUATE

## 2017-10-23 NOTE — Telephone Encounter (Signed)
See note

## 2017-10-23 NOTE — Telephone Encounter (Signed)
Patient notified of her lab results. She is having urinary symptoms- urgency and burning and would like the antibiotic sent into [facility] [address]. (See Lab note for antibiotic)  Lab not in basket

## 2017-10-23 NOTE — Telephone Encounter (Signed)
Called patient and left voicemail to call office back for lab results.

## 2017-10-24 ENCOUNTER — Other Ambulatory Visit: Payer: Self-pay

## 2017-10-24 MED ORDER — CEPHALEXIN 500 MG PO CAPS: 500.0000 mg | ORAL_CAPSULE | Freq: Two times a day (BID) | ORAL | 0 refills | Status: DC

## 2017-10-24 NOTE — Telephone Encounter (Signed)
Prescription sent to pharmacy as requested.

## 2017-11-04 ENCOUNTER — Other Ambulatory Visit: Payer: Self-pay | Admitting: Family Medicine

## 2017-12-03 ENCOUNTER — Ambulatory Visit (INDEPENDENT_AMBULATORY_CARE_PROVIDER_SITE_OTHER): Payer: Medicare Other | Admitting: Psychology

## 2017-12-03 DIAGNOSIS — F4321 Adjustment disorder with depressed mood: Secondary | ICD-10-CM

## 2018-01-07 ENCOUNTER — Ambulatory Visit (INDEPENDENT_AMBULATORY_CARE_PROVIDER_SITE_OTHER): Payer: Medicare Other | Admitting: Psychology

## 2018-01-07 DIAGNOSIS — F4321 Adjustment disorder with depressed mood: Secondary | ICD-10-CM

## 2018-01-13 ENCOUNTER — Ambulatory Visit (INDEPENDENT_AMBULATORY_CARE_PROVIDER_SITE_OTHER): Payer: Medicare Other | Admitting: Family Medicine

## 2018-01-13 ENCOUNTER — Ambulatory Visit: Payer: Self-pay

## 2018-01-13 ENCOUNTER — Encounter: Payer: Self-pay | Admitting: Family Medicine

## 2018-01-13 VITALS — BP 98/76 | HR 63 | Temp 97.6°F | Ht 68.0 in | Wt 172.2 lb

## 2018-01-13 DIAGNOSIS — F324 Major depressive disorder, single episode, in partial remission: Secondary | ICD-10-CM | POA: Diagnosis not present

## 2018-01-13 DIAGNOSIS — R109 Unspecified abdominal pain: Secondary | ICD-10-CM

## 2018-01-13 DIAGNOSIS — I10 Essential (primary) hypertension: Secondary | ICD-10-CM

## 2018-01-13 DIAGNOSIS — R5383 Other fatigue: Secondary | ICD-10-CM | POA: Diagnosis not present

## 2018-01-13 MED ORDER — QUINAPRIL HCL 5 MG PO TABS: 5.0000 mg | ORAL_TABLET | Freq: Every day | ORAL | 3 refills | Status: DC

## 2018-01-13 MED ORDER — ESCITALOPRAM OXALATE 20 MG PO TABS: 20.0000 mg | ORAL_TABLET | Freq: Every day | ORAL | 1 refills | Status: DC

## 2018-01-13 NOTE — Telephone Encounter (Signed)
See note

## 2018-01-13 NOTE — Patient Instructions (Addendum)
Reduce quinapril to 5mg . Lower blood pressure can make you feel tired  Please stop by lab before you go  Glad the abdominal pain is improving please let us know if it worsens again  Stop celexa when you get the lexapro, start lexapro 20mg  daily. See me back in 3-4 weeks- sooner if any thoughts of self harm      Taking the medicine as directed and not missing any doses is one of the best things you can do to treat your depression.  Here are some things to keep in mind:  1) Side effects (stomach upset, some increased anxiety) may happen before you notice a benefit.  These side effects typically go away over time. 2) Changes to your dose of medicine or a change in medication all together is sometimes necessary 3) Most people need to be on medication at least 6-12 months 4) Many people will notice an improvement within two weeks but the full effect of the medication can take up to 4-6 weeks 5) Stopping the medication when you start feeling better often results in a return of symptoms 6) If you start having thoughts of hurting yourself or others after starting this medicine, call our office immediately at [phone] or seek care through 911.

## 2018-01-13 NOTE — Telephone Encounter (Signed)
noted 

## 2018-01-13 NOTE — Telephone Encounter (Signed)
Pt. Reports she started having abdominal pain last Wed. She "had a little diarrhea that day." None since then. Denies fever. States the pain is "all the way across" at the belt line. Radiates to her back at times. Comes and goes. "Maybe it's a kidney infection." Appointment made for today. Reason for Disposition . Age > 60 years  Answer Assessment - Initial Assessment Questions 1. LOCATION: "Where does it hurt?"      Hurts across middle 2. RADIATION: "Does the pain shoot anywhere else?" (e.g., chest, back)     Yes - back 3. ONSET: "When did the pain begin?" (e.g., minutes, hours or days ago)       Last Wed. 4. SUDDEN: "Gradual or sudden onset?"     Gradual 5. PATTERN "Does the pain come and go, or is it constant?"    - If constant: "Is it getting better, staying the same, or worsening?"      (Note: Constant means the pain never goes away completely; most serious pain is constant and it progresses)     - If intermittent: "How long does it last?" "Do you have pain now?"     (Note: Intermittent means the pain goes away completely between bouts)     Comes and goes 6. SEVERITY: "How bad is the pain?"  (e.g., Scale 1-10; mild, moderate, or severe)   - MILD (1-3): doesn't interfere with normal activities, abdomen soft and not tender to touch    - MODERATE (4-7): interferes with normal activities or awakens from sleep, tender to touch    - SEVERE (8-10): excruciating pain, doubled over, unable to do any normal activities      5 7. RECURRENT SYMPTOM: "Have you ever had this type of abdominal pain before?" If so, ask: "When was the last time?" and "What happened that time?"      Yes 8. CAUSE: "What do you think is causing the abdominal pain?"     Unsure 9. RELIEVING/AGGRAVATING FACTORS: "What makes it better or worse?" (e.g., movement, antacids, bowel movement)     No 10. OTHER SYMPTOMS: "Has there been any vomiting, diarrhea, constipation, or urine problems?"       No energy 11. PREGNANCY: "Is  there any chance you are pregnant?" "When was your last menstrual period?"       No  Protocols used: ABDOMINAL PAIN - [facility]

## 2018-01-13 NOTE — Progress Notes (Signed)
Subjective:  Kim Singh is a 77 y.o. year old very pleasant female patient who presents for/with See problem oriented charting ROS- admits to sadness over loss of son. No SI. Has had some diarrhea, abdominal pain, flank pain   Past Medical History-  Patient Active Problem List   Diagnosis Date Noted  . Type II diabetes mellitus with neurological manifestations ([place]) 11/29/2013    Priority: High  . Diabetic neuropathy ([place]) 08/22/2015    Priority: Medium  . Depression 02/28/2015    Priority: Medium  . Dyslipidemia 11/29/2013    Priority: Medium  . Hypothyroidism 11/29/2013    Priority: Medium  . Essential hypertension 11/29/2013    Priority: Medium  . Former smoker 02/28/2015    Priority: Low  . History of breast cancer 11/30/2013    Priority: Low  . History of thyroid cancer 11/30/2013    Priority: Low  . Orthostatic hypotension 11/29/2013    Priority: Low  . Grief reaction 05/16/2017    Medications- reviewed and updated Current Outpatient Medications  Medication Sig Dispense Refill  . CALCIUM-VITAMIN D PO Take 1 tablet by mouth 2 (two) times daily.     Marland Kitchen ezetimibe (ZETIA) 10 MG tablet TAKE 1 TABLET DAILY 90 tablet 3  . fluticasone (FLONASE) 50 MCG/ACT nasal spray Place 2 sprays into both nostrils daily. 16 g 5  . levothyroxine (SYNTHROID, LEVOTHROID) 100 MCG tablet Take 1 tablet (100 mcg total) by mouth daily. 90 tablet 3  . metFORMIN (GLUCOPHAGE) 1000 MG tablet TAKE 1 TABLET DAILY WITH BREAKFAST 90 tablet 3  . nadolol (CORGARD) 40 MG tablet TAKE 1 TABLET DAILY 90 tablet 3  . Omega-3-acid Ethyl Esters (OMACOR PO) Take 1 tablet by mouth 2 (two) times daily.     . quinapril (ACCUPRIL) 5 MG tablet Take 1 tablet (5 mg total) by mouth daily. 90 tablet 3  . rosuvastatin (CRESTOR) 10 MG tablet TAKE 1 TABLET DAILY 90 tablet 3  . escitalopram (LEXAPRO) 20 MG tablet Take 1 tablet (20 mg total) by mouth daily. 90 tablet 1   No current facility-administered medications for this  visit.     Objective: BP 98/76 (BP Location: Right Arm, Patient Position: Sitting, Cuff Size: Large)   Pulse 63   Temp 97.6 F (36.4 C) (Oral)   Ht 5\' 8"  (1.727 m)   Wt 172 lb 3.2 oz (78.1 kg)   SpO2 95%   BMI 26.18 kg/m  Gen: NAD, resting comfortably TM normal, oropharynx normal, no obvious thyromegaly, some rhinorrhea in bilateral nares CV: RRR no murmurs rubs or gallops Lungs: CTAB no crackles, wheeze, rhonchi Abdomen: soft/nontender/nondistended/normal bowel sounds. No rebound or guarding.  Ext: no edema Skin: warm, dry Psych:tearful on multiple occasions  Assessment/Plan:      Abdominal pain/diarrhea S:  patient has had some abdominal pain last Wednesday. She had some diarrhea right at the start but none since then. Afebrile. Pain is across the abdomen in band like pain. She was concerned about UTI- as it radiates into her back at times.   Had ice cream 2 days in a row and then developed above symptoms. She states it felt inflammed in stomach Feels like she is jumpier than she used to be. Took magnesium in past and ended up with diarrhea- thought she may had been low.   Patient states abdominal pain and diarrhea have pretty much resolved at this point but states just doesn't feel good-  A/P: See depression section- I think patient is grieving and then focuses  on physical symptoms at some points. She needed reassurance today about physical symptoms though improving so we got cbc, cmp, tsh, ua, urine culture- to reassure her and rule out UTI/cystitis.   Essential hypertension S: controlled on  quinapril 8m and nadolol 40mg . Has some lightheadedness and feels fatigue as noted BP Readings from Last 3 Encounters:  01/13/18 98/76  10/22/17 100/70  05/23/17 120/84  A/P: We discussed blood pressure goal of <140/90. Continue current meds:  But reduce quinapril to 5mg  with 3-4 week follow up  Depression S: Patient with PHQ9 of over 10 today (does not appear written in). Has  been on Citalopram 40mg  on for several years  Has been doing grief counseling with hospice and meeting with behavioral health here.  Low energy - feels like needs gatorade but drinks and doesn't help A/P: only partial remission at this point-  we opted to change to lexapro 20mg - per her preference to change something. Continue counseling as above. We discussed time is likely one of the biggest things that will help her heal but we will do our best to help. Close follow up 3-4 weeks.    Future Appointments  Date Time Provider [place]  04/24/2018 10:00 AM Williemae Area, RN [facility]  04/24/2018 11:00 AM Marin Olp, MD [facility]   Lab/Order associations: Flank pain - Plan: Urine Culture, POCT urinalysis dipstick, CANCELED: POCT Urinalysis Dipstick (Automated)  Fatigue, unspecified type - Plan: CBC, Comprehensive metabolic panel, TSH, POCT urinalysis dipstick  Essential hypertension  Major depressive disorder with single episode, in partial remission ([place])  Meds ordered this encounter  Medications  . DISCONTD: quinapril (ACCUPRIL) 5 MG tablet    Sig: Take 1 tablet (5 mg total) by mouth daily.    Dispense:  90 tablet    Refill:  3  . escitalopram (LEXAPRO) 20 MG tablet    Sig: Take 1 tablet (20 mg total) by mouth daily.    Dispense:  90 tablet    Refill:  1  . quinapril (ACCUPRIL) 5 MG tablet    Sig: Take 1 tablet (5 mg total) by mouth daily.    Dispense:  90 tablet    Refill:  3    Return precautions advised.  Garret Reddish, MD

## 2018-01-14 ENCOUNTER — Other Ambulatory Visit (INDEPENDENT_AMBULATORY_CARE_PROVIDER_SITE_OTHER): Payer: Medicare Other

## 2018-01-14 DIAGNOSIS — R5383 Other fatigue: Secondary | ICD-10-CM

## 2018-01-14 DIAGNOSIS — R109 Unspecified abdominal pain: Secondary | ICD-10-CM | POA: Diagnosis not present

## 2018-01-14 LAB — COMPREHENSIVE METABOLIC PANEL
ALBUMIN: 4.1 g/dL (ref 3.5–5.2)
ALK PHOS: 58 U/L (ref 39–117)
ALT: 21 U/L (ref 0–35)
AST: 26 U/L (ref 0–37)
BUN: 18 mg/dL (ref 6–23)
CALCIUM: 9.9 mg/dL (ref 8.4–10.5)
CHLORIDE: 94 meq/L — AB (ref 96–112)
CO2: 29 mEq/L (ref 19–32)
CREATININE: 1.09 mg/dL (ref 0.40–1.20)
GFR: 51.44 mL/min — ABNORMAL LOW (ref >60.00)
Glucose, Bld: 96 mg/dL (ref 70–99)
Potassium: 3.8 mEq/L (ref 3.5–5.1)
Sodium: 135 mEq/L (ref 135–145)
TOTAL PROTEIN: 7.4 g/dL (ref 6.0–8.3)
Total Bilirubin: 1.1 mg/dL (ref 0.2–1.2)

## 2018-01-14 LAB — POCT URINALYSIS DIPSTICK
Bilirubin, UA: NEGATIVE
Glucose, UA: NEGATIVE
KETONES UA: NEGATIVE
Leukocytes, UA: NEGATIVE
NITRITE UA: NEGATIVE
PH UA: 5.5 (ref 5.0–8.0)
Protein, UA: NEGATIVE
RBC UA: NEGATIVE
SPEC GRAV UA: 1.015 (ref 1.010–1.025)
UROBILINOGEN UA: 0.2 U/dL

## 2018-01-14 LAB — CBC
HEMATOCRIT: 40.2 % (ref 36.0–46.0)
Hemoglobin: 13.8 g/dL (ref 12.0–15.0)
MCHC: 34.4 g/dL (ref 30.0–36.0)
MCV: 92.6 fl (ref 78.0–100.0)
PLATELETS: 241 10*3/uL (ref 150.0–400.0)
RBC: 4.34 Mil/uL (ref 3.87–5.11)
RDW: 13.4 % (ref 11.5–15.5)
WBC: 8.6 10*3/uL (ref 4.0–10.5)

## 2018-01-14 LAB — TSH: TSH: 2.62 u[IU]/mL (ref 0.35–4.50)

## 2018-01-14 NOTE — Assessment & Plan Note (Addendum)
S: Patient with PHQ9 of over 10 today (does not appear written in). Has been on Citalopram 40mg  on for several years  Has been doing grief counseling with hospice and meeting with behavioral health here.  Low energy - feels like needs gatorade but drinks and doesn't help A/P: only partial remission at this point-  we opted to change to lexapro 20mg - per her preference to change something. Continue counseling as above. We discussed time is likely one of the biggest things that will help her heal but we will do our best to help. Close follow up 3-4 weeks.

## 2018-01-14 NOTE — Assessment & Plan Note (Signed)
S: controlled on  quinapril 32m and nadolol 40mg . Has some lightheadedness and feels fatigue as noted BP Readings from Last 3 Encounters:  01/13/18 98/76  10/22/17 100/70  05/23/17 120/84  A/P: We discussed blood pressure goal of <140/90. Continue current meds:  But reduce quinapril to 5mg  with 3-4 week follow up

## 2018-01-15 LAB — URINE CULTURE
MICRO NUMBER:: 90757643
SPECIMEN QUALITY:: ADEQUATE

## 2018-01-16 ENCOUNTER — Other Ambulatory Visit: Payer: Self-pay

## 2018-01-16 MED ORDER — CEPHALEXIN 500 MG PO CAPS: 500.0000 mg | ORAL_CAPSULE | Freq: Two times a day (BID) | ORAL | 0 refills | Status: DC

## 2018-03-04 ENCOUNTER — Encounter: Payer: Self-pay | Admitting: Family Medicine

## 2018-03-04 ENCOUNTER — Ambulatory Visit (INDEPENDENT_AMBULATORY_CARE_PROVIDER_SITE_OTHER): Payer: Medicare Other | Admitting: Family Medicine

## 2018-03-04 VITALS — BP 100/62 | HR 64 | Temp 98.3°F | Ht 68.0 in | Wt 173.4 lb

## 2018-03-04 DIAGNOSIS — I10 Essential (primary) hypertension: Secondary | ICD-10-CM

## 2018-03-04 NOTE — Progress Notes (Signed)
Subjective:  Kim Singh is a 77 y.o. year old very pleasant female patient who presents for/with See problem oriented charting ROS- No chest pain or shortness of breath. No headache or blurry vision.    Past Medical History-  Patient Active Problem List   Diagnosis Date Noted  . Type II diabetes mellitus with neurological manifestations ([place]) 11/29/2013    Priority: High  . Diabetic neuropathy ([place]) 08/22/2015    Priority: Medium  . Depression 02/28/2015    Priority: Medium  . Dyslipidemia 11/29/2013    Priority: Medium  . Hypothyroidism 11/29/2013    Priority: Medium  . Essential hypertension 11/29/2013    Priority: Medium  . Former smoker 02/28/2015    Priority: Low  . History of breast cancer 11/30/2013    Priority: Low  . History of thyroid cancer 11/30/2013    Priority: Low  . Orthostatic hypotension 11/29/2013    Priority: Low  . Grief reaction 05/16/2017    Medications- reviewed and updated Current Outpatient Medications  Medication Sig Dispense Refill  . CALCIUM-VITAMIN D PO Take 1 tablet by mouth 2 (two) times daily.     Marland Kitchen escitalopram (LEXAPRO) 20 MG tablet Take 1 tablet (20 mg total) by mouth daily. 90 tablet 1  . ezetimibe (ZETIA) 10 MG tablet TAKE 1 TABLET DAILY 90 tablet 3  . levothyroxine (SYNTHROID, LEVOTHROID) 100 MCG tablet Take 1 tablet (100 mcg total) by mouth daily. 90 tablet 3  . metFORMIN (GLUCOPHAGE) 1000 MG tablet TAKE 1 TABLET DAILY WITH BREAKFAST 90 tablet 3  . nadolol (CORGARD) 40 MG tablet TAKE 1 TABLET DAILY 90 tablet 3  . Omega-3-acid Ethyl Esters (OMACOR PO) Take 1 tablet by mouth 2 (two) times daily.     . quinapril (ACCUPRIL) 5 MG tablet Take 1 tablet (5 mg total) by mouth daily. 90 tablet 3  . rosuvastatin (CRESTOR) 10 MG tablet TAKE 1 TABLET DAILY 90 tablet 3   No current facility-administered medications for this visit.     Objective: BP 100/62 (BP Location: Left Arm, Patient Position: Sitting, Cuff Size: Normal)   Pulse 64    Temp 98.3 F (36.8 C) (Oral)   Ht 5\' 8"  (1.727 m)   Wt 173 lb 6.1 oz (78.6 kg)   SpO2 94%   BMI 26.36 kg/m  Gen: NAD, resting comfortably CV: RRR no murmurs rubs or gallops Lungs: CTAB no crackles, wheeze, rhonchi Ext: no edema Skin: warm, dry Neuro: grossly normal, moves all extremities Not lightheaded with standing  Assessment/Plan:  Essential hypertension S: controlled on quinapril 5mg  (reduction from 10)and nadolol 40mg . Over last few years down 15 lbs from eating better. She is no longer having orthostatic symptoms on lower dose BP Readings from Last 3 Encounters:  03/04/18 100/62  01/13/18 98/76  10/22/17 100/70  A/P: We discussed blood pressure goal of <140/90. Continue current meds:  We discussed stopping nadolol but she would prefer to taper off so will take 20mg  daily for about a month then come back to reassess- likely can stop at follow up. Also considered stopping quinapril but renal protective in diabetes so of 2 options will continue quinapril   Future Appointments  Date Time Provider [place]  04/24/2018 10:00 AM [facility]  04/24/2018 11:00 AM Marin Olp, MD [facility]   Return precautions advised.  Garret Reddish, MD

## 2018-03-04 NOTE — Assessment & Plan Note (Signed)
S: controlled on quinapril 5mg  (reduction from 10)and nadolol 40mg . Over last few years down 15 lbs from eating better. She is no longer having orthostatic symptoms on lower dose BP Readings from Last 3 Encounters:  03/04/18 100/62  01/13/18 98/76  10/22/17 100/70  A/P: We discussed blood pressure goal of <140/90. Continue current meds:  We discussed stopping nadolol but she would prefer to taper off so will take 20mg  daily for about a month then come back to reassess- likely can stop at follow up. Also considered stopping quinapril but renal protective in diabetes so of 2 options will continue quinapril

## 2018-03-04 NOTE — Patient Instructions (Addendum)
Take half of your nadolol 40mg  (if you can cut it). See me back in 1 month- we may have to take you off of it completely. Blood pressure remains on low end of normal and we dont want to cause you to feel lightheaded or dizzy.   If you feel lightheaded or dizzy see me back sooner and we may reduce sooner.

## 2018-03-10 ENCOUNTER — Telehealth: Payer: Self-pay | Admitting: *Deleted

## 2018-03-10 NOTE — Telephone Encounter (Signed)
Called and spoke with patient who states she has had a headache since she has cut her dosage in half. She feels that it is related to blood pressure. I encouraged her to purchase a home cuff and take her blood pressure at the same time everyday and write it down in a log. She verbalized understanding.  Please advise

## 2018-03-10 NOTE — Telephone Encounter (Signed)
Copied from [place] [id number]. Topic: General - Other >> Mar 10, 2018 10:13 AM Yvette Rack wrote: Reason for CRM: Pt states she lowered the dose of nadolol (CORGARD) 40 MG tablet by cutting it in half daily. Pt requests call back from Dr. Yong Channel or his nurse. Cb# [phone]

## 2018-03-10 NOTE — Telephone Encounter (Signed)
That is good advice Roselyn Reef- please let her know to see [place] if consistently >140/90 or less than or close to 100/60 or if she simply continues to feel poorly with some more time in this adjustment phase

## 2018-03-10 NOTE — Telephone Encounter (Signed)
Per the OV note dated 03/04/18:  Essential hypertension S: controlled on quinapril 5mg  (reduction from 10)and nadolol 40mg . Over last few years down 15 lbs from eating better. She is no longer having orthostatic symptoms on lower dose    BP Readings from Last 3 Encounters:  03/04/18 100/62  01/13/18 98/76  10/22/17 100/70  A/P: We discussed blood pressure goal of <140/90. Continue current meds:  We discussed stopping nadolol but she would prefer to taper off so will take 20mg  daily for about a month then come back to reassess- likely can stop at follow up. Also considered stopping quinapril but renal protective in diabetes so of 2 options will continue quinapril

## 2018-03-11 ENCOUNTER — Ambulatory Visit (INDEPENDENT_AMBULATORY_CARE_PROVIDER_SITE_OTHER): Payer: Medicare Other | Admitting: Family Medicine

## 2018-03-11 ENCOUNTER — Encounter: Payer: Self-pay | Admitting: Family Medicine

## 2018-03-11 VITALS — BP 124/78 | HR 60 | Temp 97.9°F | Ht 68.0 in | Wt 175.2 lb

## 2018-03-11 DIAGNOSIS — R519 Headache, unspecified: Secondary | ICD-10-CM

## 2018-03-11 DIAGNOSIS — I10 Essential (primary) hypertension: Secondary | ICD-10-CM | POA: Diagnosis not present

## 2018-03-11 DIAGNOSIS — R51 Headache: Secondary | ICD-10-CM

## 2018-03-11 NOTE — Patient Instructions (Addendum)
Blood pressure looks great today  I would like for you to check your blood pressure at home at least 3x a week for the next month. The goal on average is to be less than 140/90. If you see an average above that please see [place] sooner, otherwise how about we check in - during your October visit  If headaches do not continue to improve or they worsen happy to see you back sooner as well or if you have other new or concerning symptoms

## 2018-03-11 NOTE — Progress Notes (Signed)
Subjective:  Kim Singh is a 77 y.o. year old very pleasant female patient who presents for/with See problem oriented charting ROS- No chest pain or shortness of breath. No blurry vision.     Past Medical History-  Patient Active Problem List   Diagnosis Date Noted  . Type II diabetes mellitus with neurological manifestations ([place]) 11/29/2013    Priority: High  . Diabetic neuropathy ([place]) 08/22/2015    Priority: Medium  . Depression 02/28/2015    Priority: Medium  . Dyslipidemia 11/29/2013    Priority: Medium  . Hypothyroidism 11/29/2013    Priority: Medium  . Essential hypertension 11/29/2013    Priority: Medium  . Former smoker 02/28/2015    Priority: Low  . History of breast cancer 11/30/2013    Priority: Low  . History of thyroid cancer 11/30/2013    Priority: Low  . Orthostatic hypotension 11/29/2013    Priority: Low  . Grief reaction 05/16/2017    Medications- reviewed and updated Current Outpatient Medications  Medication Sig Dispense Refill  . CALCIUM-VITAMIN D PO Take 1 tablet by mouth 2 (two) times daily.     Marland Kitchen escitalopram (LEXAPRO) 20 MG tablet Take 1 tablet (20 mg total) by mouth daily. 90 tablet 1  . ezetimibe (ZETIA) 10 MG tablet TAKE 1 TABLET DAILY 90 tablet 3  . levothyroxine (SYNTHROID, LEVOTHROID) 100 MCG tablet Take 1 tablet (100 mcg total) by mouth daily. 90 tablet 3  . metFORMIN (GLUCOPHAGE) 1000 MG tablet TAKE 1 TABLET DAILY WITH BREAKFAST 90 tablet 3  . nadolol (CORGARD) 40 MG tablet TAKE 1 TABLET DAILY 90 tablet 3  . Omega-3-acid Ethyl Esters (OMACOR PO) Take 1 tablet by mouth 2 (two) times daily.     . quinapril (ACCUPRIL) 5 MG tablet Take 1 tablet (5 mg total) by mouth daily. 90 tablet 3  . rosuvastatin (CRESTOR) 10 MG tablet TAKE 1 TABLET DAILY 90 tablet 3   No current facility-administered medications for this visit.     Objective: BP 124/78   Pulse 60   Temp 97.9 F (36.6 C) (Oral)   Ht 5\' 8"  (1.727 m)   Wt 175 lb 3.2 oz (79.5 kg)    SpO2 95%   BMI 26.64 kg/m  Gen: NAD, resting comfortably CV: RRR no murmurs rubs or gallops Lungs: CTAB no crackles, wheeze, rhonchi Abdomen: soft/nontender/nondistended/normal bowel sounds. Ext: no edema Skin: warm, dry  Assessment/Plan:  Essential hypertension Headache  s: controlled on nadolol 20mg  (half tablet), quinapril 5mg )  Was having headaches when cutting down med- now slowly improving. Perhaps 2/10 today  Most of home readings <140/90 though did have 2 readings slightly higher than this but overall average <140/90 BP Readings from Last 3 Encounters:  03/11/18 124/78. Home cuff reading 120/81.   03/04/18 100/62  01/13/18 98/76  A/P: We discussed blood pressure goal of <140/90. Continue current meds:  She will continue home monitoring. She is reassured given headaches are improving as she adjusts to lower dose of nadolol-she will let me know if she has worsening headache pattern again.  We discussed nadolol can be used for headaches and perhaps that is why the lower dose caused symptoms-she was worried her blood pressure was high but blood pressure looks very reasonable   Future Appointments  Date Time Provider [place]  04/24/2018 10:00 AM [facility]  04/24/2018 11:00 AM Marin Olp, MD [facility]   Return precautions advised.  Garret Reddish, MD

## 2018-03-11 NOTE — Telephone Encounter (Signed)
Spoke to pt told her Dr. Yong Channel said if blood pressure is consistently 140/90 or less than or close to 100/60 or if you continue to feel poorly with some more time in this adjustment phase he would like for you to schedule an appointment. Pt verbalized understanding.

## 2018-03-12 NOTE — Assessment & Plan Note (Signed)
Headache  s: controlled on nadolol 20mg  (half tablet), quinapril 5mg )  Was having headaches when cutting down med- now slowly improving. Perhaps 2/10 today  Most of home readings <140/90 though did have 2 readings slightly higher than this but overall average <140/90 BP Readings from Last 3 Encounters:  03/11/18 124/78. Home cuff reading 120/81.   03/04/18 100/62  01/13/18 98/76  A/P: We discussed blood pressure goal of <140/90. Continue current meds:  She will continue home monitoring. She is reassured given headaches are improving as she adjusts to lower dose of nadolol-she will let me know if she has worsening headache pattern again.  We discussed nadolol can be used for headaches and perhaps that is why the lower dose caused symptoms-she was worried her blood pressure was high but blood pressure looks very reasonable

## 2018-04-17 ENCOUNTER — Ambulatory Visit: Payer: Self-pay

## 2018-04-17 NOTE — Telephone Encounter (Signed)
Pt. Reports [facility] sent the wrong dose of Accupril - sent 10 mg instead of 5 mg. (Too small to break in half - sending them back.) Please send a 30 day supply of Accupril 5 mg daily to [facility] on [address]. Only has 2 pills left.

## 2018-04-17 NOTE — Telephone Encounter (Signed)
See note

## 2018-04-18 MED ORDER — QUINAPRIL HCL 5 MG PO TABS: 5.0000 mg | ORAL_TABLET | Freq: Every day | ORAL | 0 refills | Status: DC

## 2018-04-18 NOTE — Telephone Encounter (Signed)
Pt notified Rx for Accupril 5 mg was sent to [place].

## 2018-04-18 NOTE — Addendum Note (Signed)
Addended by: Marian Sorrow on: 04/18/2018 10:23 AM   Modules accepted: Orders

## 2018-04-24 ENCOUNTER — Ambulatory Visit: Payer: Medicare Other

## 2018-04-24 ENCOUNTER — Encounter: Payer: Medicare Other | Admitting: Family Medicine

## 2018-04-25 DIAGNOSIS — L03818 Cellulitis of other sites: Secondary | ICD-10-CM | POA: Diagnosis not present

## 2018-04-25 DIAGNOSIS — Z23 Encounter for immunization: Secondary | ICD-10-CM | POA: Diagnosis not present

## 2018-05-27 ENCOUNTER — Other Ambulatory Visit: Payer: Self-pay | Admitting: Family Medicine

## 2018-06-09 ENCOUNTER — Other Ambulatory Visit: Payer: Self-pay

## 2018-06-25 ENCOUNTER — Other Ambulatory Visit: Payer: Self-pay | Admitting: Family Medicine

## 2018-07-09 ENCOUNTER — Ambulatory Visit (INDEPENDENT_AMBULATORY_CARE_PROVIDER_SITE_OTHER): Payer: Medicare Other | Admitting: Psychology

## 2018-07-09 DIAGNOSIS — F320 Major depressive disorder, single episode, mild: Secondary | ICD-10-CM

## 2018-09-03 ENCOUNTER — Encounter: Payer: Self-pay | Admitting: Family Medicine

## 2018-09-03 ENCOUNTER — Ambulatory Visit (INDEPENDENT_AMBULATORY_CARE_PROVIDER_SITE_OTHER): Payer: Medicare Other

## 2018-09-03 VITALS — BP 102/80 | HR 62 | Temp 98.1°F | Ht 68.0 in | Wt 176.0 lb

## 2018-09-03 DIAGNOSIS — E1149 Type 2 diabetes mellitus with other diabetic neurological complication: Secondary | ICD-10-CM | POA: Diagnosis not present

## 2018-09-03 DIAGNOSIS — Z23 Encounter for immunization: Secondary | ICD-10-CM

## 2018-09-03 DIAGNOSIS — Z0000 Encounter for general adult medical examination without abnormal findings: Secondary | ICD-10-CM

## 2018-09-03 LAB — POCT GLYCOSYLATED HEMOGLOBIN (HGB A1C): HEMOGLOBIN A1C: 6 % — AB (ref 4.0–5.6)

## 2018-09-03 NOTE — Progress Notes (Signed)
PCP notes: Last OV 03/11/2018   Health maintenance: Health Maintenance Due  Topic Date Due  . INFLUENZA VACCINE -done today 02/20/2018  . FOOT EXAM Done today 04/23/2018  . HEMOGLOBIN A1C -done today 04/23/2018  . OPHTHALMOLOGY EXAM -scheduled this week 08/22/2018     Abnormal screenings: MMSE score of 30   Patient concerns: None   Nurse concerns:None   Next PCP appt: 09/26/2018

## 2018-09-03 NOTE — Progress Notes (Signed)
Subjective:   Kim Singh is a 78 y.o. female who presents for Medicare Annual (Subsequent) preventive examination.  Review of Systems:  No ROS.  Medicare Wellness Visit. Additional risk factors are reflected in the social history.  Cardiac Risk Factors include: advanced age (>35mn, >>63women);diabetes mellitus Patient lives with her daughter, son in law, and grandson in a 2 story house. She enjoys playing games on her iPad, watch tv, sit on the porch, visit with friends, plays board games every Saturday night with family. She occasionally knits. She is a member of [employer]  Goes to bed around 10pm. She does wake up occasionally during the night so she will listen to a podcast. No CPAP. She wakes up around 7:30am. She feels rested most mornings.    Objective:     Vitals: BP 102/80 (BP Location: Left Arm, Patient Position: Sitting, Cuff Size: Large)   Pulse 62   Temp 98.1 F (36.7 C) (Oral)   Ht 5' 8"  (1.727 m)   Wt 176 lb (79.8 kg)   SpO2 93%   BMI 26.76 kg/m   Body mass index is 26.76 kg/m.  Advanced Directives 09/03/2018 04/23/2017 07/22/2016 05/08/2016  Does Patient Have a Medical Advance Directive? Yes Yes Yes Yes  Type of AParamedicof [place] will [place] will [place]-  Does patient want to make changes to medical advance directive? No - Patient declined Yes (MAU/Ambulatory/Procedural Areas - Information given) - -  Copy of [place] Chart? No - copy requested No - copy requested No - copy requested -  Would patient like information on creating a medical advance directive? - - No - Patient declined -    Tobacco Social History   Tobacco Use  Smoking Status Former Smoker  . Packs/day: 1.00  . Years: 41.00  . Pack years: 41.00  . Types: Cigarettes  . Last attempt to quit: 08/02/1997  . Years since quitting: 21.1  Smokeless Tobacco Never Used       Counseling given: Not Answered     Past Medical History:  Diagnosis Date  . Breast cancer ([place] 2001   Left  . Diabetes mellitus type 2, uncomplicated ([place]   . Personal history of chemotherapy   . Personal history of radiation therapy   . Pure hypercholesterolemia   . Unspecified disorder of thyroid   . Unspecified essential hypertension    Past Surgical History:  Procedure Laterality Date  . BREAST EXCISIONAL BIOPSY Left   . BREAST LUMPECTOMY Left 2001  . THYROIDECTOMY     total   Family History  Problem Relation Age of Onset  . Diabetes Mother   . Hearing loss Father   . Depression Son   . Cancer Sister        Bile Duct Cancer   Social History   Socioeconomic History  . Marital status: Widowed    Spouse name: Not on file  . Number of children: Not on file  . Years of education: Not on file  . Highest education level: Not on file  Occupational History  . Not on file  Social Needs  . Financial resource strain: Not on file  . Food insecurity:    Worry: Not on file    Inability: Not on file  . Transportation needs:    Medical: Not on file    Non-medical: Not on file  Tobacco Use  . Smoking status: Former Smoker  Packs/day: 1.00    Years: 41.00    Pack years: 41.00    Types: Cigarettes    Last attempt to quit: 08/02/1997    Years since quitting: 21.1  . Smokeless tobacco: Never Used  Substance and Sexual Activity  . Alcohol use: Yes    Alcohol/week: 7.0 standard drinks    Types: 7 Glasses of wine per week    Comment: glass of wine every day  . Drug use: No  . Sexual activity: Not Currently  Lifestyle  . Physical activity:    Days per week: Not on file    Minutes per session: Not on file  . Stress: Not on file  Relationships  . Social connections:    Talks on phone: Not on file    Gets together: Not on file    Attends religious service: Not on file    Active member of club or organization: Not on file    Attends meetings of clubs or  organizations: Not on file    Relationship status: Not on file  Other Topics Concern  . Not on file  Social History Narrative   Widowed 2008. 3 children. 4 grandkids. No greatgrandkids.    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       Hobbies: in [place] had a widowed group, here joined a church with kids, Psychologist, occupational work. Looking for friends network    Outpatient Encounter Medications as of 09/03/2018  Medication Sig  . CALCIUM-VITAMIN D PO Take 1 tablet by mouth 2 (two) times daily.   Marland Kitchen escitalopram (LEXAPRO) 20 MG tablet TAKE 1 TABLET DAILY  . ezetimibe (ZETIA) 10 MG tablet TAKE 1 TABLET DAILY  . levothyroxine (SYNTHROID, LEVOTHROID) 100 MCG tablet TAKE 1 TABLET DAILY  . metFORMIN (GLUCOPHAGE) 1000 MG tablet TAKE 1 TABLET DAILY WITH BREAKFAST  . nadolol (CORGARD) 40 MG tablet TAKE 1 TABLET DAILY  . Omega-3-acid Ethyl Esters (OMACOR PO) Take 1 tablet by mouth 2 (two) times daily.   . quinapril (ACCUPRIL) 5 MG tablet Take 1 tablet (5 mg total) by mouth daily.  . rosuvastatin (CRESTOR) 10 MG tablet TAKE 1 TABLET DAILY   No facility-administered encounter medications on file as of 09/03/2018.     Activities of Daily Living In your present state of health, do you have any difficulty performing the following activities: 09/03/2018  Hearing? N  Vision? N  Difficulty concentrating or making decisions? N  Walking or climbing stairs? N  Dressing or bathing? N  Doing errands, shopping? N  Preparing Food and eating ? N  Using the Toilet? N  In the past six months, have you accidently leaked urine? N  Do you have problems with loss of bowel control? N  Comment Has to be careful with greasy foods  Managing your Medications? N  Managing your Finances? N  Housekeeping or managing your Housekeeping? N  Some recent data might be hidden    Patient Care  Team: Marin Olp, MD as PCP - General (Family Medicine)    Assessment:   This is a routine wellness examination for [place].  Exercise Activities and Dietary recommendations Current Exercise Habits: The patient does not participate in regular exercise at present, Exercise limited by: Other - see comments  Breakfast: Cereal or oatmeal, coffee, fruit  Lunch: sandwich or left overs, drinks water  Dinner:Tortillas, hamburger, refried  beans, cheese, tomatoes, hot sauce, drinks water, occasional wine1 Goals    . Exercise 150 minutes per week (moderate activity)     Will continue with PT  Will develop a personal plan of exercise that meets your needs;  Older adults aged 63 or older, who are generally fit and have no health conditions that limit their mobility, should try to be active daily and should do: at least 150 minutes of moderate aerobic activity such as cycling or walking every week, and  strength exercises on two or more days a week that work all the major muscles (legs, hips, back, abdomen, chest, shoulders and arms).      OR  75 minutes of vigorous aerobic activity such as running or a game of singles tennis every week, and  strength exercises on two or more days a week that work all the major muscles (legs, hips, back, abdomen, chest, shoulders and arms).     OR  a mix of moderate and vigorous aerobic activity every week. For example, two 30-minute runs, plus 30 minutes of fast walking, equates to 150 minutes of moderate aerobic activity, and  strength exercises on two or more days a week that work all the major muscles (legs, hips, back, abdomen, chest, shoulders and arms).  A rule of thumb is that one minute of vigorous activity provides the same health benefits as two minutes of moderate activity. You should also try to break up long periods of sitting with light activity, as sedentary behaviour is now considered an independent risk factor for ill health, no matter how  much exercise you do. Find out why sitting is bad for your health. Older adults at risk of falls, such as people with weak legs, poor balance and some medical conditions, should do exercises to improve balance and co-ordination on at least two days a week. Examples include yoga, tai chi and dancing.      . maintain current health status       Fall Risk Fall Risk  09/03/2018 06/09/2018 04/23/2017 05/08/2016 08/22/2015  Falls in the past year? 1 1 Yes Yes No  Comment Slipped down the stairs Emmi Telephone Survey: data to providers prior to load - - -  Number falls in past yr: 0 1 1 1  -  Comment - Emmi Telephone Survey Actual Response = 1 - - -  Injury with Fall? 0 1 No - -  Follow up - - Education provided;Falls prevention discussed Education provided -     Depression Screen PHQ 2/9 Scores 09/03/2018 03/11/2018 10/22/2017 04/23/2017  PHQ - 2 Score 0 0 2 2  PHQ- 9 Score 2 1 5 2      Cognitive Function MMSE - Mini Mental State Exam 09/03/2018 05/08/2016  Not completed: - (No Data)  Orientation to time 5 -  Orientation to Place 5 -  Registration 3 -  Attention/ Calculation 5 -  Recall 3 -  Language- name 2 objects 2 -  Language- repeat 1 -  Language- follow 3 step command 3 -  Language- read & follow direction 1 -  Write a sentence 1 -  Copy design 1 -  Total score 30 -        Immunization History  Administered Date(s) Administered  . Influenza, High Dose Seasonal PF 06/28/2014, 04/06/2016, 05/13/2017, 09/03/2018  . Pneumococcal Conjugate-13 06/28/2014, 04/17/2016  . Pneumococcal Polysaccharide-23 08/22/2015  . Tdap 04/17/2016     Screening Tests Health Maintenance  Topic Date Due  . OPHTHALMOLOGY EXAM  08/22/2018  . HEMOGLOBIN A1C  03/04/2019  . FOOT EXAM  09/04/2019  . TETANUS/TDAP  04/17/2026  . INFLUENZA VACCINE  Completed  . DEXA SCAN  Completed  . PNA vac Low Risk Adult  Completed         Plan:   Follow Up with PCP as Advised   I have personally reviewed  and noted the following in the patient's chart:   . Medical and social history . Use of alcohol, tobacco or illicit drugs  . Current medications and supplements . Functional ability and status . Nutritional status . Physical activity . Advanced directives . List of other physicians . Vitals . Screenings to include cognitive, depression, and falls . Referrals and appointments  In addition, I have reviewed and discussed with patient certain preventive protocols, quality metrics, and best practice recommendations. A written personalized care plan for preventive services as well as general preventive health recommendations were provided to patient.     [place], [place]  [id number]

## 2018-09-03 NOTE — Patient Instructions (Addendum)
Ms. Clodfelter , Thank you for taking time to come for your Medicare Wellness Visit. I appreciate your ongoing commitment to your health goals. Please review the following plan we discussed and let me know if I can assist you in the future.   These are the goals we discussed: Goals    . Exercise 150 minutes per week (moderate activity)     Will continue with PT  Will develop a personal plan of exercise that meets your needs;  Older adults aged 80 or older, who are generally fit and have no health conditions that limit their mobility, should try to be active daily and should do: at least 150 minutes of moderate aerobic activity such as cycling or walking every week, and  strength exercises on two or more days a week that work all the major muscles (legs, hips, back, abdomen, chest, shoulders and arms).      OR  75 minutes of vigorous aerobic activity such as running or a game of singles tennis every week, and  strength exercises on two or more days a week that work all the major muscles (legs, hips, back, abdomen, chest, shoulders and arms).     OR  a mix of moderate and vigorous aerobic activity every week. For example, two 30-minute runs, plus 30 minutes of fast walking, equates to 150 minutes of moderate aerobic activity, and  strength exercises on two or more days a week that work all the major muscles (legs, hips, back, abdomen, chest, shoulders and arms).  A rule of thumb is that one minute of vigorous activity provides the same health benefits as two minutes of moderate activity. You should also try to break up long periods of sitting with light activity, as sedentary behaviour is now considered an independent risk factor for ill health, no matter how much exercise you do. Find out why sitting is bad for your health. Older adults at risk of falls, such as people with weak legs, poor balance and some medical conditions, should do exercises to improve balance and co-ordination on at least two  days a week. Examples include yoga, tai chi and dancing.      . maintain current health status       This is a list of the screening recommended for you and due dates:  Health Maintenance  Topic Date Due  . Flu Shot  02/20/2018  . Complete foot exam   04/23/2018  . Hemoglobin A1C  04/23/2018  . Eye exam for diabetics  08/22/2018  . Tetanus Vaccine  04/17/2026  . DEXA scan (bone density measurement)  Completed  . Pneumonia vaccines  Completed   Preventive Care for Adults  A healthy lifestyle and preventive care can promote health and wellness. Preventive health guidelines for adults include the following key practices.  . A routine yearly physical is a good way to check with your health care provider about your health and preventive screening. It is a chance to share any concerns and updates on your health and to receive a thorough exam.  . Visit your dentist for a routine exam and preventive care every 6 months. Brush your teeth twice a day and floss once a day. Good oral hygiene prevents tooth decay and gum disease.  . The frequency of eye exams is based on your age, health, family medical history, use  of contact lenses, and other factors. Follow your health care provider's recommendations for frequency of eye exams.  . Eat a healthy diet. Foods  like vegetables, fruits, whole grains, low-fat dairy products, and lean protein foods contain the nutrients you need without too many calories. Decrease your intake of foods high in solid fats, added sugars, and salt. Eat the right amount of calories for you. Get information about a proper diet from your health care provider, if necessary.  . Regular physical exercise is one of the most important things you can do for your health. Most adults should get at least 150 minutes of moderate-intensity exercise (any activity that increases your heart rate and causes you to sweat) each week. In addition, most adults need muscle-strengthening  exercises on 2 or more days a week.  Silver Sneakers may be a benefit available to you. To determine eligibility, you may visit the website: www.silversneakers.com or contact program at [phone] Mon-Fri between 8AM-8PM.   . Maintain a healthy weight. The body mass index (BMI) is a screening tool to identify possible weight problems. It provides an estimate of body fat based on height and weight. Your health care provider can find your BMI and can help you achieve or maintain a healthy weight.   For adults 20 years and older: ? A BMI below 18.5 is considered underweight. ? A BMI of 18.5 to 24.9 is normal. ? A BMI of 25 to 29.9 is considered overweight. ? A BMI of 30 and above is considered obese.   . Maintain normal blood lipids and cholesterol levels by exercising and minimizing your intake of saturated fat. Eat a balanced diet with plenty of fruit and vegetables. Blood tests for lipids and cholesterol should begin at age 13 and be repeated every 5 years. If your lipid or cholesterol levels are high, you are over 50, or you are at high risk for heart disease, you may need your cholesterol levels checked more frequently. Ongoing high lipid and cholesterol levels should be treated with medicines if diet and exercise are not working.  . If you smoke, find out from your health care provider how to quit. If you do not use tobacco, please do not start.  . If you choose to drink alcohol, please do not consume more than 2 drinks per day. One drink is considered to be 12 ounces (355 mL) of beer, 5 ounces (148 mL) of wine, or 1.5 ounces (44 mL) of liquor.  . If you are 32-24 years old, ask your health care provider if you should take aspirin to prevent strokes.  . Use sunscreen. Apply sunscreen liberally and repeatedly throughout the day. You should seek shade when your shadow is shorter than you. Protect yourself by wearing long sleeves, pants, a wide-brimmed hat, and sunglasses year round,  whenever you are outdoors.  . Once a month, do a whole body skin exam, using a mirror to look at the skin on your back. Tell your health care provider of new moles, moles that have irregular borders, moles that are larger than a pencil eraser, or moles that have changed in shape or color.

## 2018-09-03 NOTE — Progress Notes (Signed)
I have reviewed and agree with note, evaluation, plan.   Reeta Kuk, MD  

## 2018-09-23 ENCOUNTER — Other Ambulatory Visit: Payer: Self-pay | Admitting: Family Medicine

## 2018-09-26 ENCOUNTER — Encounter: Payer: Self-pay | Admitting: Family Medicine

## 2018-09-26 ENCOUNTER — Ambulatory Visit (INDEPENDENT_AMBULATORY_CARE_PROVIDER_SITE_OTHER): Payer: Medicare Other | Admitting: Family Medicine

## 2018-09-26 VITALS — BP 108/76 | HR 58 | Temp 97.6°F | Ht 67.25 in | Wt 178.2 lb

## 2018-09-26 DIAGNOSIS — E1142 Type 2 diabetes mellitus with diabetic polyneuropathy: Secondary | ICD-10-CM

## 2018-09-26 DIAGNOSIS — Z0000 Encounter for general adult medical examination without abnormal findings: Secondary | ICD-10-CM | POA: Diagnosis not present

## 2018-09-26 DIAGNOSIS — E785 Hyperlipidemia, unspecified: Secondary | ICD-10-CM

## 2018-09-26 DIAGNOSIS — I10 Essential (primary) hypertension: Secondary | ICD-10-CM

## 2018-09-26 DIAGNOSIS — E663 Overweight: Secondary | ICD-10-CM | POA: Diagnosis not present

## 2018-09-26 DIAGNOSIS — Z6827 Body mass index (BMI) 27.0-27.9, adult: Secondary | ICD-10-CM | POA: Diagnosis not present

## 2018-09-26 DIAGNOSIS — Z1283 Encounter for screening for malignant neoplasm of skin: Secondary | ICD-10-CM | POA: Diagnosis not present

## 2018-09-26 DIAGNOSIS — F325 Major depressive disorder, single episode, in full remission: Secondary | ICD-10-CM | POA: Diagnosis not present

## 2018-09-26 DIAGNOSIS — E1149 Type 2 diabetes mellitus with other diabetic neurological complication: Secondary | ICD-10-CM | POA: Diagnosis not present

## 2018-09-26 DIAGNOSIS — E039 Hypothyroidism, unspecified: Secondary | ICD-10-CM

## 2018-09-26 LAB — COMPREHENSIVE METABOLIC PANEL
ALT: 24 U/L (ref 0–35)
AST: 25 U/L (ref 0–37)
Albumin: 4.4 g/dL (ref 3.5–5.2)
Alkaline Phosphatase: 55 U/L (ref 39–117)
BUN: 17 mg/dL (ref 6–23)
CO2: 30 mEq/L (ref 19–32)
Calcium: 10.1 mg/dL (ref 8.4–10.5)
Chloride: 100 mEq/L (ref 96–112)
Creatinine, Ser: 0.74 mg/dL (ref 0.40–1.20)
GFR: 75.54 mL/min (ref >60.00)
Glucose, Bld: 98 mg/dL (ref 70–99)
Potassium: 4 mEq/L (ref 3.5–5.1)
SODIUM: 138 meq/L (ref 135–145)
Total Bilirubin: 1.6 mg/dL — ABNORMAL HIGH (ref 0.2–1.2)
Total Protein: 7.1 g/dL (ref 6.0–8.3)

## 2018-09-26 LAB — LIPID PANEL
Cholesterol: 110 mg/dL (ref 0–200)
HDL: 43.3 mg/dL (ref >39.00)
LDL CALC: 36 mg/dL (ref 0–99)
NonHDL: 66.38
Total CHOL/HDL Ratio: 3
Triglycerides: 151 mg/dL — ABNORMAL HIGH (ref 0.0–149.0)
VLDL: 30.2 mg/dL (ref 0.0–40.0)

## 2018-09-26 LAB — CBC
HCT: 43.3 % (ref 36.0–46.0)
Hemoglobin: 14.9 g/dL (ref 12.0–15.0)
MCHC: 34.4 g/dL (ref 30.0–36.0)
MCV: 92.6 fl (ref 78.0–100.0)
Platelets: 189 10*3/uL (ref 150.0–400.0)
RBC: 4.68 Mil/uL (ref 3.87–5.11)
RDW: 13.2 % (ref 11.5–15.5)
WBC: 6.2 10*3/uL (ref 4.0–10.5)

## 2018-09-26 NOTE — Progress Notes (Signed)
Phone: [phone]   Subjective:  Patient presents today for their annual physical. Chief complaint-noted.   See problem oriented charting- ROS- full  review of systems was completed and negative except for: fatigue, hearing loss, some post nasal drip, neuropathy, still grieving loss of son, some anxiety, sleep issues at times.   The following were reviewed and entered/updated in epic: Past Medical History:  Diagnosis Date  . Breast cancer ([place]) 2001   Left  . Diabetes mellitus type 2, uncomplicated ([place])   . Personal history of chemotherapy   . Personal history of radiation therapy   . Pure hypercholesterolemia   . Unspecified disorder of thyroid   . Unspecified essential hypertension    Patient Active Problem List   Diagnosis Date Noted  . Type II diabetes mellitus with neurological manifestations ([place]) 11/29/2013    Priority: High  . Diabetic neuropathy ([place]) 08/22/2015    Priority: Medium  . Depression 02/28/2015    Priority: Medium  . Dyslipidemia 11/29/2013    Priority: Medium  . Hypothyroidism 11/29/2013    Priority: Medium  . Essential hypertension 11/29/2013    Priority: Medium  . Former smoker 02/28/2015    Priority: Low  . History of breast cancer 11/30/2013    Priority: Low  . History of thyroid cancer 11/30/2013    Priority: Low  . Orthostatic hypotension 11/29/2013    Priority: Low  . Grief reaction 05/16/2017   Past Surgical History:  Procedure Laterality Date  . BREAST EXCISIONAL BIOPSY Left   . BREAST LUMPECTOMY Left 2001  . THYROIDECTOMY     total    Family History  Problem Relation Age of Onset  . Diabetes Mother   . Hearing loss Father   . Depression Son   . Cancer Sister        Bile Duct Cancer    Medications- reviewed and updated Current Outpatient Medications  Medication Sig Dispense Refill  . CALCIUM-VITAMIN D PO Take 1 tablet by mouth 2 (two) times daily.     Marland Kitchen escitalopram (LEXAPRO) 20 MG tablet TAKE 1 TABLET DAILY 90  tablet 4  . ezetimibe (ZETIA) 10 MG tablet TAKE 1 TABLET DAILY 90 tablet 3  . levothyroxine (SYNTHROID, LEVOTHROID) 100 MCG tablet TAKE 1 TABLET DAILY 90 tablet 3  . metFORMIN (GLUCOPHAGE) 1000 MG tablet TAKE 1 TABLET DAILY WITH BREAKFAST 90 tablet 3  . nadolol (CORGARD) 40 MG tablet TAKE 1 TABLET DAILY 90 tablet 3  . Omega-3-acid Ethyl Esters (OMACOR PO) Take 1 tablet by mouth 2 (two) times daily.     . quinapril (ACCUPRIL) 5 MG tablet Take 1 tablet (5 mg total) by mouth daily. 30 tablet 0  . rosuvastatin (CRESTOR) 10 MG tablet TAKE 1 TABLET DAILY 90 tablet 4   No current facility-administered medications for this visit.     Allergies-reviewed and updated Allergies  Allergen Reactions  . Codeine Other (See Comments)    GI upset    Social History   Social History Narrative   Widowed 2008. 3 children. 4 grandkids. No greatgrandkids.    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       Hobbies: in [place] had a widowed group, here joined a church with kids, Psychologist, occupational work. Looking for friends network   Objective  Objective:  BP 108/76 (BP Location: Left Arm, Patient Position: Sitting, Cuff Size: Normal)  Pulse (!) 58   Temp 97.6 F (36.4 C) (Oral)   Ht 5' 7.25" (1.708 m)   Wt 178 lb 4 oz (80.9 kg)   SpO2 96%   BMI 27.71 kg/m  Gen: NAD, resting comfortably HEENT: Mucous membranes are moist. Oropharynx normal Neck: no thyromegaly CV: RRR no murmurs rubs or gallops Lungs: CTAB no crackles, wheeze, rhonchi Abdomen: soft/nontender/nondistended/normal bowel sounds. No rebound or guarding.  Ext: no edema Skin: warm, dry Neuro: grossly normal, moves all extremities, PERRLA   Assessment and Plan    78 y.o. female presenting for annual physical.  Health Maintenance counseling: 1. Anticipatory guidance: Patient counseled regarding regular  dental exams -q6 months, eye exams - has nexxt week ,  avoiding smoking and second hand smoke , limiting alcohol to 1 beverage per day .   2. Risk factor reduction:  Advised patient of need for regular exercise and diet rich and fruits and vegetables to reduce risk of heart attack and stroke. Exercise- not doing well- encouraged to increase this. Diet-feels like eating reasonably balanced.  Wt Readings from Last 3 Encounters:  09/26/18 178 lb 4 oz (80.9 kg)  09/03/18 176 lb (79.8 kg)  03/11/18 175 lb 3.2 oz (79.5 kg)  3. Immunizations/screenings/ancillary studies- recommended shingles shot at [facility] [facility] History  Administered Date(s) Administered  . Influenza, High Dose Seasonal PF 06/28/2014, 04/06/2016, 05/13/2017, 09/03/2018  . Pneumococcal Conjugate-13 06/28/2014, 04/17/2016  . Pneumococcal Polysaccharide-23 08/22/2015  . Tdap 04/17/2016  4. Cervical cancer screening- passed age based screening.  5. Breast cancer screening-  mammogram 02/2017- she thinks she missed after loss of son- plans to start back.  6. Colon cancer screening - states never had abnormal colonoscopy. States had normal one after age 28 so she is not due for this.  7. Skin cancer screening- will refer to dermatology. advised regular sunscreen use. Denies worrisome, changing, or new skin lesions.  8. Birth control/STD check- postmenopausal . Not sexually active 9. Osteoporosis screening at 70- 10/16/11 was normal- no need for repat -former smoker- quit 1999 with over 40 pack years. Past indication for lung cancer screening.   Status of chronic or acute concerns   #Diabetes with neuropathy.  S: controlled on Metformin 1000 mg daily . Not on medicine for neuropathy- doesn't want to take anything Lab Results  Component Value Date   HGBA1C 6.0 (A) 09/03/2018   HGBA1C 6.4 10/22/2017   HGBA1C 6.4 04/23/2017   A/P: A1c was well controlled last month-continue current medication -Continue without medication for  neuropathy  #Hypertension S: controlled on nadolol 20 mg-half of 40 mg tablet, quinapril 5 mg. Very rare lightheadedness BP Readings from Last 3 Encounters:  09/26/18 108/76  09/03/18 102/80  03/11/18 124/78  A/P: We discussed blood pressure goal of <140/90. Continue current meds: Stable/doing well - if worsening orthostatic issues continue further reduction   Depression  s: Patient's depression remains controlled on Lexapro 20 mg.  Continues to go to grief counseling with hospice and meeting with behavioral health there as well. Depression screen [facility] 2/9 09/03/2018  Decreased Interest 0  Down, Depressed, Hopeless 0  PHQ - 2 Score 0  Altered sleeping 1  Tired, decreased energy 1  Change in appetite 0  Feeling bad or failure about yourself  0  Trouble concentrating 0  Moving slowly or fidgety/restless 0  Suicidal thoughts 0  PHQ-9 Score 2  Difficult doing work/chores Not difficult at all  A/P:  Stable. Continue current medications. Still grieving loss of son  in October 2018 due to suicide  #Hyperlipidemia S: Very well controlled on Zetia 10 mg and Crestor 10 mg daily. A/P: Needs full lipid panel- will complete this with blood work.  #Hypothyroidism S: Compliant with levothyroxine 100 mcg A/P: Likely controlled-update TSH today  Return in about 6 months (around 03/29/2019) for follow up- or sooner if needed.  Lab/Order associations:bowl of cheerios at 10 am  Preventative health care - Plan: CBC, Comprehensive metabolic panel, Lipid panel  Type II diabetes mellitus with neurological manifestations (HCC) - Plan: CBC, Comprehensive metabolic panel, Lipid panel  Diabetic polyneuropathy associated with type 2 diabetes mellitus (HCC)  Hypothyroidism, unspecified type  Dyslipidemia  Essential hypertension  Major depressive disorder with single episode, in full remission ([place])  Overweight  BMI 27.0-27.9,adult  Skin cancer screening - Plan: Ambulatory referral to  Dermatology  Return precautions advised.  Garret Reddish, MD

## 2018-09-26 NOTE — Patient Instructions (Addendum)
Health Maintenance Due  Topic Date Due  . OPHTHALMOLOGY EXAM - have them send [place] a copy of your eye exam later this month  08/22/2018   Please check with your pharmacy to see if they have the shingrix vaccine. If they do- please get this immunization and update [place] by phone call or mychart with dates you receive the vaccine. Likely will have to get on waiting list  Please stop by lab before you go If you do not have mychart- we will call you about results within 5 business days of [place] receiving them.  If you have mychart- we will send your results within 3 business days of [place] receiving them.  If abnormal or we want to clarify a result, we will call or mychart you to make sure you receive the message.  If you have questions or concerns or don't hear within 5-7 days, please send [place] a message or call us.

## 2018-10-11 ENCOUNTER — Encounter: Payer: Self-pay | Admitting: Family Medicine

## 2018-10-13 ENCOUNTER — Ambulatory Visit (INDEPENDENT_AMBULATORY_CARE_PROVIDER_SITE_OTHER): Payer: Medicare Other | Admitting: Family Medicine

## 2018-10-13 ENCOUNTER — Encounter: Payer: Self-pay | Admitting: Family Medicine

## 2018-10-13 VITALS — BP 126/81 | Temp 97.4°F | Wt 177.9 lb

## 2018-10-13 DIAGNOSIS — E039 Hypothyroidism, unspecified: Secondary | ICD-10-CM | POA: Diagnosis not present

## 2018-10-13 DIAGNOSIS — E785 Hyperlipidemia, unspecified: Secondary | ICD-10-CM

## 2018-10-13 DIAGNOSIS — I10 Essential (primary) hypertension: Secondary | ICD-10-CM | POA: Diagnosis not present

## 2018-10-13 DIAGNOSIS — J301 Allergic rhinitis due to pollen: Secondary | ICD-10-CM

## 2018-10-13 DIAGNOSIS — E119 Type 2 diabetes mellitus without complications: Secondary | ICD-10-CM | POA: Diagnosis not present

## 2018-10-13 MED ORDER — FLUTICASONE PROPIONATE 50 MCG/ACT NA SUSP: 2.0000 | Freq: Every day | NASAL | 3 refills | Status: DC

## 2018-10-13 MED ORDER — NADOLOL 20 MG PO TABS: 20.0000 mg | ORAL_TABLET | Freq: Every day | ORAL | 3 refills | Status: DC

## 2018-10-13 NOTE — Patient Instructions (Signed)
Video visit

## 2018-10-13 NOTE — Progress Notes (Signed)
Phone [phone]   Subjective:  Virtual visit via Video note  Our team/I connected with Kim Singh on 10/13/18 at  4:00 PM EDT by a video enabled telemedicine application (webex) and verified that I am speaking with the correct person using two identifiers.  Location patient: Home Location provider: [facility], office Persons participating in the virtual visit:  Patient, daughter  Our team/I discussed the limitations of evaluation and management by telemedicine and the availability of in person appointments. In light of current covid-19 pandemic, patient also understands that we are trying to protect them by minimizing in office contact if at all possible.  The patient expressed consent for telemedicine visit and agreed to proceed.   ROS- no fever, chills. Has had some cough, ear fullness, runny nose.    Past Medical History-  Patient Active Problem List   Diagnosis Date Noted  . Type II diabetes mellitus with neurological manifestations ([place]) 11/29/2013    Priority: High  . Diabetic neuropathy ([place]) 08/22/2015    Priority: Medium  . Depression 02/28/2015    Priority: Medium  . Dyslipidemia 11/29/2013    Priority: Medium  . Hypothyroidism 11/29/2013    Priority: Medium  . Essential hypertension 11/29/2013    Priority: Medium  . Former smoker 02/28/2015    Priority: Low  . History of breast cancer 11/30/2013    Priority: Low  . History of thyroid cancer 11/30/2013    Priority: Low  . Orthostatic hypotension 11/29/2013    Priority: Low  . Grief reaction 05/16/2017    Medications- reviewed and updated Current Outpatient Medications  Medication Sig Dispense Refill  . CALCIUM-VITAMIN D PO Take 1 tablet by mouth 2 (two) times daily.     Marland Kitchen escitalopram (LEXAPRO) 20 MG tablet TAKE 1 TABLET DAILY 90 tablet 4  . ezetimibe (ZETIA) 10 MG tablet TAKE 1 TABLET DAILY 90 tablet 3  . fluticasone (FLONASE) 50 MCG/ACT nasal spray Place 2 sprays into both nostrils daily. 16 g 3  .  levothyroxine (SYNTHROID, LEVOTHROID) 100 MCG tablet TAKE 1 TABLET DAILY 90 tablet 3  . metFORMIN (GLUCOPHAGE) 1000 MG tablet TAKE 1 TABLET DAILY WITH BREAKFAST 90 tablet 3  . nadolol (CORGARD) 20 MG tablet Take 1 tablet (20 mg total) by mouth daily. 90 tablet 3  . Omega-3-acid Ethyl Esters (OMACOR PO) Take 1 tablet by mouth 2 (two) times daily.     . quinapril (ACCUPRIL) 5 MG tablet Take 1 tablet (5 mg total) by mouth daily. 30 tablet 0  . rosuvastatin (CRESTOR) 10 MG tablet TAKE 1 TABLET DAILY 90 tablet 4   No current facility-administered medications for this visit.      Objective:  BP 126/81   Temp (!) 97.4 F (36.3 C)   Wt 177 lb 14.4 oz (80.7 kg)   BMI 27.66 kg/m  Gen: NAD, resting comfortably Wipes nose during visit Lungs: nonlabored, normal respiratory rate  Skin: warm, dry, no obvious rash     Assessment and Plan   #dizziness/head congestion/rhinorrhea/watery eyes S: Leaky eyes, cold sore, runny nose a few weeks ago. BP has been higher- see conversation below.. Phlegm in throat. Clear draiange. Comes out easy. Feels stopped up in ears- clear rhinorrhea. Every once and a while feeling dizzy. Feels ok overall. Staying hydrated  Sudafed helped but advised to avoid with BP A/P:  Suspect allergies and full eustachian tubes could be causing some of issues, allergies as likely cause- we will trial flonase- just 1 spray each nostril for 2-3 weeks  and she will reach out ot me- likely try 2 sprays each nostril if not controlled  #hypertension S: controlled on nadolol  And quinapril 5 mg. Patient sent me some home #s recently- overall average was <140/90 but she was concerned because she had dizziness and one BP into 160s- we reviewed overall trend is controlled.  BP Readings from Last 3 Encounters:  10/13/18 126/81- home cuff  09/26/18 108/76  09/03/18 102/80  A/P: Stable. Continue current medications.   #hypothyroidism S: On thyroid medication-levothyroxine 100 mcg  Lab  Results  Component Value Date   TSH 2.62 01/13/2018   A/P: has been controlled- ideally needs TSH next labs  # Diabetes S:  controlled on metformin 1000mg  daily alone Lab Results  Component Value Date   HGBA1C 6.0 (A) 09/03/2018   HGBA1C 6.4 10/22/2017   HGBA1C 6.4 04/23/2017   A/P: Stable. Continue current medications.   #hyperlipidemia S: reasonably controlled on rosuvastatin 10mg   A/P: last LDL under 70 which is ideal- continue current rx  Other notes: 1.eye doctor visit got moved back due to covid-19.     Future Appointments  Date Time Provider [place]  03/31/2019  1:00 PM Marin Olp, MD [facility]  09/28/2019  1:00 PM Marin Olp, MD [facility]   Meds ordered this encounter  Medications  . fluticasone (FLONASE) 50 MCG/ACT nasal spray    Sig: Place 2 sprays into both nostrils daily.    Dispense:  16 g    Refill:  3  . nadolol (CORGARD) 20 MG tablet    Sig: Take 1 tablet (20 mg total) by mouth daily.    Dispense:  90 tablet    Refill:  3   Return precautions advised.  Garret Reddish, MD

## 2018-10-24 ENCOUNTER — Telehealth: Payer: Self-pay | Admitting: Family Medicine

## 2018-10-24 NOTE — Telephone Encounter (Signed)
Copied from [place] [id number]. Topic: General - Other >> Oct 24, 2018  2:20 PM Leward Quan A wrote: Reason for CRM: Patient called to say that she received a bill for $133.95 and per the person that she spoke to in billing it was because she received her Physical too early. Patient is asking that Dr Yong Channel check his notes and rebill the insurance she was told that visit on 10/22/17 was her physical so she was a month early. Please advise Ph# [phone]

## 2018-11-08 ENCOUNTER — Encounter: Payer: Self-pay | Admitting: Family Medicine

## 2018-11-10 MED ORDER — METFORMIN HCL 1000 MG PO TABS: 1000.0000 mg | ORAL_TABLET | Freq: Every day | ORAL | 3 refills | Status: DC

## 2018-12-17 ENCOUNTER — Other Ambulatory Visit: Payer: Self-pay | Admitting: Family Medicine

## 2018-12-18 ENCOUNTER — Telehealth: Payer: Self-pay | Admitting: Family Medicine

## 2018-12-18 MED ORDER — METFORMIN HCL 1000 MG PO TABS: 1000.0000 mg | ORAL_TABLET | Freq: Every day | ORAL | 3 refills | Status: DC

## 2018-12-18 NOTE — Telephone Encounter (Signed)
Rx refilled.

## 2018-12-18 NOTE — Telephone Encounter (Signed)
MEDICATION: metFORMIN (GLUCOPHAGE) 1000 MG tablet fluticasone (FLONASE) 50 MCG/ACT nasal spray  PHARMACY:   [facility] - [address] [address], [place] [phone] (Phone) [phone] (Fax)     IS THIS A 90 DAY SUPPLY : yes  IS PATIENT OUT OF MEDICATION: no/ last filled metformin on 11/10/18 at [facility], last filled fluticasone on 10/13/18 at [facility]. [facility]  IF NOT; HOW MUCH IS LEFT:   LAST APPOINTMENT DATE: @03 /23/20 NEXT APPOINTMENT DATE:@9 /02/2019  OTHER COMMENTS:    **Let patient know to contact pharmacy at the end of the day to make sure medication is ready. **  ** Please notify patient to allow 48-72 hours to process**  **Encourage patient to contact the pharmacy for refills or they can request refills through [facility]**

## 2018-12-19 ENCOUNTER — Encounter: Payer: Self-pay | Admitting: Family Medicine

## 2018-12-19 ENCOUNTER — Ambulatory Visit (INDEPENDENT_AMBULATORY_CARE_PROVIDER_SITE_OTHER): Payer: Medicare Other | Admitting: Family Medicine

## 2018-12-19 VITALS — BP 108/74 | HR 67 | Temp 99.0°F | Ht 67.25 in | Wt 181.0 lb

## 2018-12-19 DIAGNOSIS — B001 Herpesviral vesicular dermatitis: Secondary | ICD-10-CM | POA: Diagnosis not present

## 2018-12-19 DIAGNOSIS — G4700 Insomnia, unspecified: Secondary | ICD-10-CM | POA: Diagnosis not present

## 2018-12-19 MED ORDER — VALACYCLOVIR HCL 1 G PO TABS: ORAL_TABLET | ORAL | 0 refills | Status: DC

## 2018-12-19 MED ORDER — TRAZODONE HCL 50 MG PO TABS: 25.0000 mg | ORAL_TABLET | Freq: Every evening | ORAL | 3 refills | Status: DC | PRN

## 2018-12-19 NOTE — Progress Notes (Signed)
Phone [phone]   Subjective:  Virtual visit via Video note. Chief complaint: Chief Complaint  Patient presents with  . Cols sore    x 2weeks .  Above lip   This visit type was conducted due to national recommendations for restrictions regarding the COVID-19 Pandemic (e.g. social distancing).  This format is felt to be most appropriate for this patient at this time balancing risks to patient and risks to population by having him in for in person visit.  No physical exam was performed (except for noted visual exam or audio findings with Telehealth visits).    Our team/I connected with [name] at 11:00 AM EDT by a video enabled telemedicine application (doxy.me or caregility through epic) and verified that I am speaking with the correct person using two identifiers.  Location patient: Home[place] Location provider: [facility] office Persons participating in the virtual visit:  patient  Our team/I discussed the limitations of evaluation and management by telemedicine and the availability of in person appointments. In light of current covid-19 pandemic, patient also understands that we are trying to protect them by minimizing in office contact if at all possible.  The patient expressed consent for telemedicine visit and agreed to proceed. Patient understands insurance will be billed.   ROS- No fever, chills, cough, shortness of breath, body aches, sore throat, or loss of taste or smell    Past Medical History-  Patient Active Problem List   Diagnosis Date Noted  . Type II diabetes mellitus with neurological manifestations ([place]) 11/29/2013    Priority: High  . Diabetic neuropathy ([place]) 08/22/2015    Priority: Medium  . Depression 02/28/2015    Priority: Medium  . Dyslipidemia 11/29/2013    Priority: Medium  . Hypothyroidism 11/29/2013    Priority: Medium  . Essential hypertension 11/29/2013    Priority: Medium  . Former smoker 02/28/2015    Priority: Low  . History of breast  cancer 11/30/2013    Priority: Low  . History of thyroid cancer 11/30/2013    Priority: Low  . Orthostatic hypotension 11/29/2013    Priority: Low  . Insomnia 12/19/2018  . Grief reaction 05/16/2017    Medications- reviewed and updated Current Outpatient Medications  Medication Sig Dispense Refill  . CALCIUM-VITAMIN D PO Take 1 tablet by mouth 2 (two) times daily.     Marland Kitchen escitalopram (LEXAPRO) 20 MG tablet TAKE 1 TABLET DAILY 90 tablet 4  . ezetimibe (ZETIA) 10 MG tablet TAKE 1 TABLET DAILY 90 tablet 3  . fluticasone (FLONASE) 50 MCG/ACT nasal spray Place 2 sprays into both nostrils daily. 16 g 3  . levothyroxine (SYNTHROID, LEVOTHROID) 100 MCG tablet TAKE 1 TABLET DAILY 90 tablet 3  . metFORMIN (GLUCOPHAGE) 1000 MG tablet Take 1 tablet (1,000 mg total) by mouth daily with breakfast. 90 tablet 3  . nadolol (CORGARD) 20 MG tablet Take 1 tablet (20 mg total) by mouth daily. 90 tablet 3  . Omega-3-acid Ethyl Esters (OMACOR PO) Take 1 tablet by mouth 2 (two) times daily.     . quinapril (ACCUPRIL) 5 MG tablet Take 1 tablet (5 mg total) by mouth daily. 30 tablet 0  . rosuvastatin (CRESTOR) 10 MG tablet TAKE 1 TABLET DAILY 90 tablet 4  . traZODone (DESYREL) 50 MG tablet Take 0.5-1 tablets (25-50 mg total) by mouth at bedtime as needed for sleep. 30 tablet 3  . valACYclovir (VALTREX) 1000 MG tablet Take 2 pills twice a day for 1 day at first sign of cold  sore 28 tablet 0   No current facility-administered medications for this visit.      Objective:  BP 108/74 (BP Location: Right Arm, Patient Position: Sitting, Cuff Size: Normal)   Pulse 67   Temp 99 F (37.2 C) (Tympanic)   Ht 5' 7.25" (1.708 m)   Wt 181 lb (82.1 kg)   BMI 28.14 kg/m  self reported vitals Gen: NAD, resting comfortably Lungs: nonlabored, normal respiratory rate  Skin: appears dry, no obvious rash, red irritated lesion appears to at least be 1 x 1 cm     Assessment and Plan   # Skin lesion/cold sore S:patient has  had a cold sore on her upper lip on left side- a few weeks ago had one at corner of her lips. Not particularly painful. She feels it is unsightly though.   Tried triamcinolone cream on the lip but it has not helped  A/P: Skin lesion appears to likely be cold sore- will treat with valtrex - we discussed another possible etiology such as skin cancer- would likely refer to dermatology if does not resolve within 2-3 weeks  # Insomnia S: Patient has been having difficulty falling asleep for some time- recently worsening and wanted to try something to help.  Over the last week she attempted Advil PM around 7 PM and was able to fall asleep by 10 PM-she found this very helpful.  She asked about taking this long-term.  She and her family have noted More energy and happier if gets good nights sleep A/P: Patient with sleep onset insomnia- we discussed risks of NSAIDs long-term as well as risks of Benadryl which is an anticholinergic at her age. We opted to trial trazodone instead - start with half tablet/25mg  - warned of risk of Serotonin syndrome as also on lexapro but risk likely low   Future Appointments  Date Time Provider [place]  03/31/2019  1:00 PM Marin Olp, MD [facility]  09/28/2019  1:00 PM Marin Olp, MD [facility]   Lab/Order associations: Insomnia, unspecified type  Cold sore  Meds ordered this encounter  Medications  . valACYclovir (VALTREX) 1000 MG tablet    Sig: Take 2 pills twice a day for 1 day at first sign of cold sore    Dispense:  28 tablet    Refill:  0  . traZODone (DESYREL) 50 MG tablet    Sig: Take 0.5-1 tablets (25-50 mg total) by mouth at bedtime as needed for sleep.    Dispense:  30 tablet    Refill:  3   Return precautions advised.  Garret Reddish, MD

## 2018-12-19 NOTE — Assessment & Plan Note (Signed)
S: Patient has been having difficulty falling asleep.  Over the last week she attempted Advil PM around 7 PM and was able to fall asleep by 10 PM-she found this very helpful.  She asked about taking this long-term.  She and her family have noted More energy and happier if gets good nights sleep A/P: Patient with sleep onset insomnia- we discussed risks of NSAIDs long-term as well as risks of Benadryl which is an anticholinergic at her age. We opted to trial trazodone instead - start with half tablet/25mg  - warned of risk of Serotonin syndrome as also on lexapro but risk likely low

## 2018-12-19 NOTE — Patient Instructions (Addendum)
Health Maintenance Due  Topic Date Due  . OPHTHALMOLOGY EXAM  08/22/2018    Depression screen [facility] 2/9 09/03/2018 03/11/2018 10/22/2017  Decreased Interest 0 0 1  Down, Depressed, Hopeless 0 0 1  PHQ - 2 Score 0 0 2  Altered sleeping 1 0 1  Tired, decreased energy 1 1 1   Change in appetite 0 0 1  Feeling bad or failure about yourself  0 0 0  Trouble concentrating 0 0 0  Moving slowly or fidgety/restless 0 0 0  Suicidal thoughts 0 0 0  PHQ-9 Score 2 1 5   Difficult doing work/chores Not difficult at all Not difficult at all Not difficult at all   Video visit

## 2018-12-23 DIAGNOSIS — E119 Type 2 diabetes mellitus without complications: Secondary | ICD-10-CM | POA: Diagnosis not present

## 2018-12-23 DIAGNOSIS — Z961 Presence of intraocular lens: Secondary | ICD-10-CM | POA: Diagnosis not present

## 2018-12-23 LAB — HM DIABETES EYE EXAM

## 2018-12-25 ENCOUNTER — Other Ambulatory Visit: Payer: Self-pay | Admitting: Family Medicine

## 2019-01-02 ENCOUNTER — Other Ambulatory Visit: Payer: Self-pay

## 2019-01-02 MED ORDER — FLUTICASONE PROPIONATE 50 MCG/ACT NA SUSP: 2.0000 | Freq: Every day | NASAL | 2 refills | Status: DC

## 2019-01-08 ENCOUNTER — Encounter: Payer: Self-pay | Admitting: Family Medicine

## 2019-01-08 MED ORDER — TRAZODONE HCL 50 MG PO TABS: 25.0000 mg | ORAL_TABLET | Freq: Every evening | ORAL | 1 refills | Status: DC | PRN

## 2019-01-08 NOTE — Telephone Encounter (Signed)
Trazodone refill sent to [facility].

## 2019-01-19 ENCOUNTER — Encounter: Payer: Self-pay | Admitting: Family Medicine

## 2019-02-06 ENCOUNTER — Other Ambulatory Visit: Payer: Self-pay | Admitting: Family Medicine

## 2019-03-27 NOTE — Progress Notes (Signed)
Phone [phone]   Subjective:  Kim Singh is a 78 y.o. year old very pleasant female patient who presents for/with See problem oriented charting Chief Complaint  Patient presents with  . Hypertension   ROS- No chest pain or shortness of breath. No headache or blurry vision.    Past Medical History-  Patient Active Problem List   Diagnosis Date Noted  . Type II diabetes mellitus with neurological manifestations ([place]) 11/29/2013    Priority: High  . Diabetic neuropathy ([place]) 08/22/2015    Priority: Medium  . Depression 02/28/2015    Priority: Medium  . Dyslipidemia 11/29/2013    Priority: Medium  . Hypothyroidism 11/29/2013    Priority: Medium  . Essential hypertension 11/29/2013    Priority: Medium  . Former smoker 02/28/2015    Priority: Low  . History of breast cancer 11/30/2013    Priority: Low  . History of thyroid cancer 11/30/2013    Priority: Low  . Orthostatic hypotension 11/29/2013    Priority: Low  . Insomnia 12/19/2018  . Grief reaction 05/16/2017    Medications- reviewed and updated Current Outpatient Medications  Medication Sig Dispense Refill  . CALCIUM-VITAMIN D PO Take 1 tablet by mouth 2 (two) times daily.     Marland Kitchen escitalopram (LEXAPRO) 20 MG tablet TAKE 1 TABLET DAILY 90 tablet 4  . ezetimibe (ZETIA) 10 MG tablet TAKE 1 TABLET DAILY 90 tablet 3  . fluticasone (FLONASE) 50 MCG/ACT nasal spray USE 2 SPRAYS IN EACH NOSTRIL DAILY 16 g 11  . levothyroxine (SYNTHROID, LEVOTHROID) 100 MCG tablet TAKE 1 TABLET DAILY 90 tablet 3  . metFORMIN (GLUCOPHAGE) 1000 MG tablet Take 1 tablet (1,000 mg total) by mouth daily with breakfast. 90 tablet 3  . nadolol (CORGARD) 20 MG tablet Take 1 tablet (20 mg total) by mouth daily. 90 tablet 3  . Omega-3-acid Ethyl Esters (OMACOR PO) Take 1 tablet by mouth 2 (two) times daily.     . quinapril (ACCUPRIL) 5 MG tablet TAKE 1 TABLET DAILY 90 tablet 3  . rosuvastatin (CRESTOR) 10 MG tablet TAKE 1 TABLET DAILY 90 tablet 4   . traZODone (DESYREL) 50 MG tablet Take 0.5-1 tablets (25-50 mg total) by mouth at bedtime as needed for sleep. 90 tablet 1  . valACYclovir (VALTREX) 1000 MG tablet Take 2 pills twice a day for 1 day at first sign of cold sore 28 tablet 0   No current facility-administered medications for this visit.      Objective:  BP 130/82 (BP Location: Right Arm, Patient Position: Sitting, Cuff Size: Normal)   Pulse (!) 57   Temp 98.4 F (36.9 C)   Ht 5\' 8"  (1.727 m)   Wt 182 lb (82.6 kg)   SpO2 97%   BMI 27.67 kg/m  Gen: NAD, resting comfortably CV: RRR no murmurs rubs or gallops Lungs: CTAB no crackles, wheeze, rhonchi Abdomen: soft/nontender/nondistended/normal bowel sounds. Ext: no edema Skin: warm, dry    Assessment and Plan   #Diabetes with neuropathy.  S:  on Metformin 1000 mg daily . Not on medicine for neuropathy- doesn't want to take anything  Weight up slightly from march up 5 lbs.  Lab Results  Component Value Date   HGBA1C 5.9 (A) 03/31/2019   A/P: a1c controlled at 5.9 on poc machine. Follow up in 6 months. Continue current meds  - for weight noted overweight status- Encouraged need for healthy eating, regular exercise, weight loss.  - declines rx for neuropathy  #Hypertension S: controlled on  nadolol 20 mg, quinapril 5 mg. No recent lightheadedness thankfully.  BP Readings from Last 3 Encounters:  03/31/19 130/82  12/19/18 108/74  10/13/18 126/81  A/P: Stable. Continue current medications.    - wants urine with labs (denies symptoms)   Depression  s: Compliant with Lexapro 20 mg.  Loss of son to suicide in October 2018 contributes. A/P: Remains in full remission with PHQ 9 of 0-continue current medication  -trazodone for sleep 25 mg very effective  #Hyperlipidemia S: Compliant with Zetia 10 mg and Crestor 10 mg daily. Lab Results  Component Value Date   CHOL 110 09/26/2018   HDL 43.30 09/26/2018   LDLCALC 36 09/26/2018   LDLDIRECT 43.0 04/23/2017   TRIG  151.0 (H) 09/26/2018   CHOLHDL 3 09/26/2018   A/P:  Stable. Continue current medications.      #Hypothyroidism S: Compliant with levothyroxine 100 mcg Lab Results  Component Value Date   TSH 2.62 01/13/2018  A/P:   Stable. Continue current medications.  Update TSH today along with BMP  # pancakes do not go down well. No trouble with other foods. Discussed small bites trial- discussed possible GI follow-up/EGD in future if worsens or begins to happen with other foods  #plans to fly to [place] in October with grandson Lincolin for 7-10 days - plans to see her niece and son - we discussed potential risks of covid 12 but she still plans to proceed further. She will at least be consistent about mask use at least- encouraged distancing even on plane if possible  Recommended follow up: already scheduled for march Future Appointments  Date Time Provider [place]  09/28/2019  1:00 PM Marin Olp, MD [facility]   Lab/Order associations:   ICD-10-CM   1. Type II diabetes mellitus with neurological manifestations (HCC)  E11.49 POCT glycosylated hemoglobin (Hb A1C)    POCT Urinalysis Dipstick (Automated)    Basic metabolic panel    POCT Urinalysis Dipstick (Automated)  2. Dyslipidemia  E78.5   3. Essential hypertension  I10   4. Diabetic polyneuropathy associated with type 2 diabetes mellitus (HCC)  E11.42   5. Major depressive disorder with single episode, in full remission ([place])  F32.5   6. Hypothyroidism, unspecified type  E03.9 TSH  7. Overweight  E66.3   8. Need for influenza vaccination  Z23 Flu Vaccine QUAD High Dose(Fluad)  9. Need for immunization against influenza  Z23 Flu Vaccine QUAD High Dose(Fluad)  10. Educated About Covid-19 Virus Infection  Z71.89    Return precautions advised.  Garret Reddish, MD

## 2019-03-27 NOTE — Patient Instructions (Addendum)
Health Maintenance Due  Topic Date Due  . INFLUENZA VACCINE - today 02/21/2019  . HEMOGLOBIN A1C   Lab Results  Component Value Date   HGBA1C 5.9 (A) 03/31/2019   03/04/2019    Please stop by lab before you go If you do not have mychart- we will call you about results within 5 business days of [place] receiving them.  If you have mychart- we will send your results within 3 business days of [place] receiving them.  If abnormal or we want to clarify a result, we will call or mychart you to make sure you receive the message.  If you have questions or concerns or don't hear within 5-7 days, please send [place] a message or call us.

## 2019-03-31 ENCOUNTER — Encounter: Payer: Self-pay | Admitting: Family Medicine

## 2019-03-31 ENCOUNTER — Ambulatory Visit (INDEPENDENT_AMBULATORY_CARE_PROVIDER_SITE_OTHER): Payer: Medicare Other | Admitting: Family Medicine

## 2019-03-31 ENCOUNTER — Other Ambulatory Visit: Payer: Self-pay

## 2019-03-31 VITALS — BP 130/82 | HR 57 | Temp 98.4°F | Ht 68.0 in | Wt 182.0 lb

## 2019-03-31 DIAGNOSIS — I10 Essential (primary) hypertension: Secondary | ICD-10-CM | POA: Diagnosis not present

## 2019-03-31 DIAGNOSIS — Z23 Encounter for immunization: Secondary | ICD-10-CM

## 2019-03-31 DIAGNOSIS — Z7189 Other specified counseling: Secondary | ICD-10-CM

## 2019-03-31 DIAGNOSIS — E663 Overweight: Secondary | ICD-10-CM

## 2019-03-31 DIAGNOSIS — E039 Hypothyroidism, unspecified: Secondary | ICD-10-CM | POA: Diagnosis not present

## 2019-03-31 DIAGNOSIS — E1142 Type 2 diabetes mellitus with diabetic polyneuropathy: Secondary | ICD-10-CM | POA: Diagnosis not present

## 2019-03-31 DIAGNOSIS — E1149 Type 2 diabetes mellitus with other diabetic neurological complication: Secondary | ICD-10-CM

## 2019-03-31 DIAGNOSIS — E785 Hyperlipidemia, unspecified: Secondary | ICD-10-CM

## 2019-03-31 DIAGNOSIS — F325 Major depressive disorder, single episode, in full remission: Secondary | ICD-10-CM

## 2019-03-31 LAB — POC URINALSYSI DIPSTICK (AUTOMATED)
Bilirubin, UA: NEGATIVE
Blood, UA: NEGATIVE
Glucose, UA: NEGATIVE
Ketones, UA: NEGATIVE
Nitrite, UA: NEGATIVE
Protein, UA: NEGATIVE
Spec Grav, UA: 1.01 (ref 1.010–1.025)
Urobilinogen, UA: 0.2 E.U./dL
pH, UA: 7.5 (ref 5.0–8.0)

## 2019-03-31 LAB — BASIC METABOLIC PANEL
BUN: 13 mg/dL (ref 6–23)
CO2: 34 mEq/L — ABNORMAL HIGH (ref 19–32)
Calcium: 10 mg/dL (ref 8.4–10.5)
Chloride: 99 mEq/L (ref 96–112)
Creatinine, Ser: 0.83 mg/dL (ref 0.40–1.20)
GFR: 66.08 mL/min (ref >60.00)
Glucose, Bld: 91 mg/dL (ref 70–99)
Potassium: 4.7 mEq/L (ref 3.5–5.1)
Sodium: 140 mEq/L (ref 135–145)

## 2019-03-31 LAB — TSH: TSH: 0.21 u[IU]/mL — ABNORMAL LOW (ref 0.35–4.50)

## 2019-03-31 LAB — POCT GLYCOSYLATED HEMOGLOBIN (HGB A1C): Hemoglobin A1C: 5.9 % — AB (ref 4.0–5.6)

## 2019-03-31 MED ORDER — EZETIMIBE 10 MG PO TABS: 10.0000 mg | ORAL_TABLET | Freq: Every day | ORAL | 3 refills | Status: DC

## 2019-03-31 MED ORDER — LEVOTHYROXINE SODIUM 100 MCG PO TABS: 100.0000 ug | ORAL_TABLET | Freq: Every day | ORAL | 3 refills | Status: DC

## 2019-03-31 MED ORDER — ROSUVASTATIN CALCIUM 10 MG PO TABS: 10.0000 mg | ORAL_TABLET | Freq: Every day | ORAL | 3 refills | Status: DC

## 2019-03-31 MED ORDER — METFORMIN HCL 1000 MG PO TABS: 1000.0000 mg | ORAL_TABLET | Freq: Every day | ORAL | 3 refills | Status: DC

## 2019-03-31 MED ORDER — QUINAPRIL HCL 5 MG PO TABS: 5.0000 mg | ORAL_TABLET | Freq: Every day | ORAL | 3 refills | Status: DC

## 2019-03-31 MED ORDER — ESCITALOPRAM OXALATE 20 MG PO TABS: 20.0000 mg | ORAL_TABLET | Freq: Every day | ORAL | 3 refills | Status: DC

## 2019-03-31 MED ORDER — TRAZODONE HCL 50 MG PO TABS: 25.0000 mg | ORAL_TABLET | Freq: Every evening | ORAL | 3 refills | Status: DC | PRN

## 2019-04-01 ENCOUNTER — Other Ambulatory Visit: Payer: Self-pay | Admitting: Physical Therapy

## 2019-04-01 DIAGNOSIS — E039 Hypothyroidism, unspecified: Secondary | ICD-10-CM

## 2019-05-19 ENCOUNTER — Other Ambulatory Visit: Payer: Self-pay

## 2019-05-19 ENCOUNTER — Other Ambulatory Visit (INDEPENDENT_AMBULATORY_CARE_PROVIDER_SITE_OTHER): Payer: Medicare Other

## 2019-05-19 DIAGNOSIS — E039 Hypothyroidism, unspecified: Secondary | ICD-10-CM | POA: Diagnosis not present

## 2019-05-21 LAB — TSH: TSH: 0.38 u[IU]/mL (ref 0.35–4.50)

## 2019-08-17 ENCOUNTER — Encounter: Payer: Self-pay | Admitting: Family Medicine

## 2019-08-31 ENCOUNTER — Other Ambulatory Visit: Payer: Self-pay | Admitting: Family Medicine

## 2019-09-02 ENCOUNTER — Ambulatory Visit (INDEPENDENT_AMBULATORY_CARE_PROVIDER_SITE_OTHER): Payer: Medicare Other | Admitting: Physician Assistant

## 2019-09-02 ENCOUNTER — Encounter: Payer: Self-pay | Admitting: Physician Assistant

## 2019-09-02 ENCOUNTER — Other Ambulatory Visit: Payer: Self-pay

## 2019-09-02 DIAGNOSIS — R220 Localized swelling, mass and lump, head: Secondary | ICD-10-CM

## 2019-09-02 MED ORDER — VALACYCLOVIR HCL 1 G PO TABS: ORAL_TABLET | ORAL | 2 refills | Status: DC

## 2019-09-02 NOTE — Patient Instructions (Signed)
It was great to see you!  Take care,  Chidera Thivierge PA-C  

## 2019-09-02 NOTE — Progress Notes (Signed)
Virtual Visit via Video   I connected with Kim Singh on 09/02/19 at  9:20 AM EST by a video enabled telemedicine application and verified that I am speaking with the correct person using two identifiers. Location patient: Home Location provider: [facility], Office Persons participating in the virtual visit: [place], Flakes PA-C, Anselmo Pickler, LPN   I discussed the limitations of evaluation and management by telemedicine and the availability of in person appointments. The patient expressed understanding and agreed to proceed.  I acted as a Education administrator for [employer], [employer], LPN  Subjective:   HPI:   Lip swollen Pt c/o both upper and lower lips are swollen x 5 days. Pt used Neosporin x 2 nights no relief. She recently used Carlton Adam on her face to remove unwanted hair and thinks she may be having a reaction to that. She denies: fever, chills, tongue swelling, airway swelling, difficulty breathing, new medications, recent vaccinations (currently on COVID-19 vaccination waiting list.)  BP Readings from Last 3 Encounters:  03/31/19 130/82  12/19/18 108/74  10/13/18 126/81     ROS: See pertinent positives and negatives per HPI.  Patient Active Problem List   Diagnosis Date Noted  . Insomnia 12/19/2018  . Grief reaction 05/16/2017  . Diabetic neuropathy ([place]) 08/22/2015  . Depression 02/28/2015  . Former smoker 02/28/2015  . History of breast cancer 11/30/2013  . History of thyroid cancer 11/30/2013  . Orthostatic hypotension 11/29/2013  . Dyslipidemia 11/29/2013  . Hypothyroidism 11/29/2013  . Type II diabetes mellitus with neurological manifestations ([place]) 11/29/2013  . Essential hypertension 11/29/2013    Social History   Tobacco Use  . Smoking status: Former Smoker    Packs/day: 1.00    Years: 41.00    Pack years: 41.00    Types: Cigarettes    Quit date: 08/02/1997    Years since quitting: 22.0  . Smokeless tobacco: Never Used    Substance Use Topics  . Alcohol use: Yes    Alcohol/week: 7.0 standard drinks    Types: 7 Glasses of wine per week    Comment: glass of wine every day    Current Outpatient Medications:  .  CALCIUM-VITAMIN D PO, Take 1 tablet by mouth 2 (two) times daily. , Disp: , Rfl:  .  escitalopram (LEXAPRO) 20 MG tablet, Take 1 tablet (20 mg total) by mouth daily., Disp: 90 tablet, Rfl: 3 .  ezetimibe (ZETIA) 10 MG tablet, Take 1 tablet (10 mg total) by mouth daily., Disp: 90 tablet, Rfl: 3 .  fluticasone (FLONASE) 50 MCG/ACT nasal spray, USE 2 SPRAYS IN EACH NOSTRIL DAILY, Disp: 16 g, Rfl: 11 .  levothyroxine (SYNTHROID) 100 MCG tablet, Take 1 tablet (100 mcg total) by mouth daily., Disp: 90 tablet, Rfl: 3 .  metFORMIN (GLUCOPHAGE) 1000 MG tablet, Take 1 tablet (1,000 mg total) by mouth daily with breakfast., Disp: 90 tablet, Rfl: 3 .  nadolol (CORGARD) 20 MG tablet, TAKE 1 TABLET DAILY, Disp: 90 tablet, Rfl: 3 .  Omega-3-acid Ethyl Esters (OMACOR PO), Take 1 tablet by mouth 2 (two) times daily. , Disp: , Rfl:  .  quinapril (ACCUPRIL) 5 MG tablet, Take 1 tablet (5 mg total) by mouth daily., Disp: 90 tablet, Rfl: 3 .  rosuvastatin (CRESTOR) 10 MG tablet, Take 1 tablet (10 mg total) by mouth daily., Disp: 90 tablet, Rfl: 3 .  traZODone (DESYREL) 50 MG tablet, Take 0.5 tablets (25 mg total) by mouth at bedtime as needed for sleep., Disp:  45 tablet, Rfl: 3 .  valACYclovir (VALTREX) 1000 MG tablet, Take two tablets ( total 2000 mg) by mouth q12h x 1 day; Start: ASAP after symptom onset, Disp: 6 tablet, Rfl: 2  Allergies  Allergen Reactions  . Codeine Other (See Comments)    GI upset    Objective:   VITALS: Per patient if applicable, see vitals. GENERAL: Alert, appears well and in no acute distress. HEENT: Atraumatic, conjunctiva clear, no obvious abnormalities on inspection of external nose and ears. Lower lip with slight swelling, has dry appearance; no erythema NECK: Normal movements of the  head and neck. CARDIOPULMONARY: No increased WOB. Speaking in clear sentences. I:E ratio WNL.  MS: Moves all visible extremities without noticeable abnormality. PSYCH: Pleasant and cooperative, well-groomed. Speech normal rate and rhythm. Affect is appropriate. Insight and judgement are appropriate. Attention is focused, linear, and appropriate.  NEURO: CN grossly intact. Oriented as arrived to appointment on time with no prompting. Moves both UE equally.  SKIN: No obvious lesions, wounds, erythema, or cyanosis noted on face or hands.  Assessment and Plan:   Kim Singh was seen today for c/o lip swollen.  Diagnoses and all orders for this visit:  Lip swelling  Other orders -     valACYclovir (VALTREX) 1000 MG tablet; Take two tablets ( total 2000 mg) by mouth q12h x 1 day; Start: ASAP after symptom onset   No red flags on discussion with patient. Will trial antihistamine prn -- drowsy precautions advised. Avoid further use of nair. Does have history of cold sores, will trial valtrex empirically as well. She is on quinapril 5 mg daily. Will stop this medication, as I think her blood pressure can handle it and possible concern for angioedema? Follow-up with myself or Dr. Yong Channel in 2-4 weeks for re-evaluation of blood pressure with this change. Worsening upper airway swelling precautions advised.  . Reviewed expectations re: course of current medical issues. . Discussed self-management of symptoms. . Outlined signs and symptoms indicating need for more acute intervention. . Patient verbalized understanding and all questions were answered. Marland Kitchen Health Maintenance issues including appropriate healthy diet, exercise, and smoking avoidance were discussed with patient. . See orders for this visit as documented in the electronic medical record.  I discussed the assessment and treatment plan with the patient. The patient was provided an opportunity to ask questions and all were answered. The patient  agreed with the plan and demonstrated an understanding of the instructions.   The patient was advised to call back or seek an in-person evaluation if the symptoms worsen or if the condition fails to improve as anticipated.   CMA or LPN served as scribe during this visit. History, Physical, and Plan performed by medical provider. The above documentation has been reviewed and is accurate and complete.  [place], [place] 09/02/2019

## 2019-09-08 ENCOUNTER — Other Ambulatory Visit: Payer: Self-pay

## 2019-09-08 ENCOUNTER — Encounter: Payer: Self-pay | Admitting: Family Medicine

## 2019-09-08 ENCOUNTER — Ambulatory Visit (INDEPENDENT_AMBULATORY_CARE_PROVIDER_SITE_OTHER): Payer: Medicare Other | Admitting: Family Medicine

## 2019-09-08 VITALS — BP 122/82 | HR 58 | Temp 98.3°F | Ht 68.0 in | Wt 182.8 lb

## 2019-09-08 DIAGNOSIS — E785 Hyperlipidemia, unspecified: Secondary | ICD-10-CM | POA: Diagnosis not present

## 2019-09-08 DIAGNOSIS — I10 Essential (primary) hypertension: Secondary | ICD-10-CM

## 2019-09-08 DIAGNOSIS — T464X5A Adverse effect of angiotensin-converting-enzyme inhibitors, initial encounter: Secondary | ICD-10-CM | POA: Diagnosis not present

## 2019-09-08 DIAGNOSIS — E039 Hypothyroidism, unspecified: Secondary | ICD-10-CM | POA: Diagnosis not present

## 2019-09-08 DIAGNOSIS — F325 Major depressive disorder, single episode, in full remission: Secondary | ICD-10-CM | POA: Diagnosis not present

## 2019-09-08 DIAGNOSIS — T783XXA Angioneurotic edema, initial encounter: Secondary | ICD-10-CM | POA: Diagnosis not present

## 2019-09-08 DIAGNOSIS — E1149 Type 2 diabetes mellitus with other diabetic neurological complication: Secondary | ICD-10-CM | POA: Diagnosis not present

## 2019-09-08 LAB — POCT GLYCOSYLATED HEMOGLOBIN (HGB A1C): Hemoglobin A1C: 6.2 % — AB (ref 4.0–5.6)

## 2019-09-08 MED ORDER — PREDNISONE 20 MG PO TABS: ORAL_TABLET | ORAL | 0 refills | Status: DC

## 2019-09-08 NOTE — Progress Notes (Signed)
Phone [phone] In person visit   Subjective:   Kim Singh is a 79 y.o. year old very pleasant female patient who presents for/with See problem oriented charting Chief Complaint  Patient presents with  . swollen lip   This visit occurred during the SARS-CoV-2 public health emergency.  Safety protocols were in place, including screening questions prior to the visit, additional usage of staff PPE, and extensive cleaning of exam room while observing appropriate contact time as indicated for disinfecting solutions.   Past Medical History-  Patient Active Problem List   Diagnosis Date Noted  . Angioedema due to angiotensin converting enzyme inhibitor (ACE-I) 09/08/2019    Priority: High  . Type II diabetes mellitus with neurological manifestations ([place]) 11/29/2013    Priority: High  . Diabetic neuropathy ([place]) 08/22/2015    Priority: Medium  . Depression 02/28/2015    Priority: Medium  . Dyslipidemia 11/29/2013    Priority: Medium  . Hypothyroidism 11/29/2013    Priority: Medium  . Essential hypertension 11/29/2013    Priority: Medium  . Former smoker 02/28/2015    Priority: Low  . History of breast cancer 11/30/2013    Priority: Low  . History of thyroid cancer 11/30/2013    Priority: Low  . Orthostatic hypotension 11/29/2013    Priority: Low  . Major depressive disorder with single episode, in full remission ([place]) 09/08/2019  . Insomnia 12/19/2018  . Grief reaction 05/16/2017    Medications- reviewed and updated Current Outpatient Medications  Medication Sig Dispense Refill  . CALCIUM-VITAMIN D PO Take 1 tablet by mouth 2 (two) times daily.     Marland Kitchen escitalopram (LEXAPRO) 20 MG tablet Take 1 tablet (20 mg total) by mouth daily. 90 tablet 3  . ezetimibe (ZETIA) 10 MG tablet Take 1 tablet (10 mg total) by mouth daily. 90 tablet 3  . fluticasone (FLONASE) 50 MCG/ACT nasal spray USE 2 SPRAYS IN EACH NOSTRIL DAILY 16 g 11  . levothyroxine (SYNTHROID) 100 MCG tablet Take  1 tablet (100 mcg total) by mouth daily. 90 tablet 3  . metFORMIN (GLUCOPHAGE) 1000 MG tablet Take 1 tablet (1,000 mg total) by mouth daily with breakfast. 90 tablet 3  . nadolol (CORGARD) 20 MG tablet TAKE 1 TABLET DAILY 90 tablet 3  . Omega-3-acid Ethyl Esters (OMACOR PO) Take 1 tablet by mouth 2 (two) times daily.     . rosuvastatin (CRESTOR) 10 MG tablet Take 1 tablet (10 mg total) by mouth daily. 90 tablet 3  . traZODone (DESYREL) 50 MG tablet Take 0.5 tablets (25 mg total) by mouth at bedtime as needed for sleep. 45 tablet 3  . predniSONE (DELTASONE) 20 MG tablet Take 1 tablet by mouth daily for 5 days, then 1/2 tablet daily for 2 days 6 tablet 0   No current facility-administered medications for this visit.     Objective:  BP 122/82   Pulse (!) 58   Temp 98.3 F (36.8 C)   Ht 5\' 8"  (1.727 m)   Wt 182 lb 12.8 oz (82.9 kg)   SpO2 94%   BMI 27.79 kg/m  Gen: NAD, resting comfortably Upper lip appears normal.  Mild swelling of lower lip compared to baseline. CV: RRR no murmurs rubs or gallops Lungs: CTAB no crackles, wheeze, rhonchi Abdomen: soft/nontender/nondistended/normal bowel sounds. No rebound or guarding.  Ext: no edema Skin: warm, dry   Diabetic Foot Exam - Simple   Simple Foot Form Diabetic Foot exam was performed with the following findings: Yes 09/08/2019 10:08  AM  Visual Inspection No deformities, no ulcerations, no other skin breakdown bilaterally: Yes Sensation Testing Intact to touch and monofilament testing bilaterally: Yes Pulse Check Posterior Tibialis and Dorsalis pulse intact bilaterally: Yes Comments        Assessment and Plan  #Angioedema S: Pt c/o swollen bottom lip for 9 days.  Patient had a virtual visit on September 02, 2019 with Inda Coke, PA-she was instructed to stop taking the quinapril.  In reflection-Had not done Carlton Adam in a while but did a few days before had lip swelling and did it close to lower lip but not on the lip. Patient had  tried neosporin after the swelling started without relief  Despite being off the nair, Neosporin, and quinapril she has only seen a mild improvement in her symptoms.  A/P: I still suspect this may be angioedema related to quinapril-I am surprised she has not had more significant improvement.  Since her A1c is okay as below-we opted to try low-dose course of prednisone instead of continued monitoring per her preference.  No signs of tongue or throat swelling at this time.  Episode could also have been related to use of Carlton Adam just below her lip but she has not had issues in the past with this but I still told her to avoid this.  Has some mild dryness of the lips and discussed could try Vaseline.  No other new medications reported.  I listed quinapril as an allergy under swollen lip/angioedema   #Diabetes with neuropathy.  S:  on Metformin 1000 mg daily . Not on medicine for neuropathy- doesn't want to take anything  Lab Results  Component Value Date   HGBA1C point-of-care 6.2 (A) 09/08/2019   HGBA1C 5.9 (A) 03/31/2019   HGBA1C 6.0 (A) 09/03/2018   A/P: Well-controlled-continue current medication-I believe she can tolerate the prednisone -We will need to get urine microalbumin since came off ACE inhibitor at next visit  #Hypertension S: controlled on nadolol 20 mg.  She is off of quinapril as of September 02, 2019 due to suspected angioedema A/P: Good control with nadolol alone-we will continue on this alone for now-asked patient to update me if blood pressure elevates and we will consider alternate such as amlodipine   Depression  s: Compliant with Lexapro 20 mg.  Loss of son to suicide in October 2018 contributes. Depression screen [facility] 2/9 09/08/2019 03/31/2019 12/19/2018  Decreased Interest 0 0 0  Down, Depressed, Hopeless 0 0 0  PHQ - 2 Score 0 0 0  Altered sleeping 0 0 0  Tired, decreased energy 0 0 1  Change in appetite 1 0 0  Feeling bad or failure about yourself  0 0 0  Trouble  concentrating 0 0 0  Moving slowly or fidgety/restless 0 0 0  Suicidal thoughts 0 0 0  PHQ-9 Score 1 0 1  Difficult doing work/chores Not difficult at all - Not difficult at all  A/P: Excellent control Lexapro 20 mg-continue current medication  #Hyperlipidemia S: Compliant with Zetia 10 mg and Crestor 10 mg daily. A/P: Too early for full lipid panel-we opted to recheck lipid panel at physical in March  #Hypothyroidism S: Compliant with levothyroxine 100 mcg A/P: Controlled on last check-we will recheck TSH at physical in March  #Health maintenance-having difficulty getting COVID-19 vaccination even though she is on the wait list-our team found the number for her to call to check on the status  Recommended follow up: Scheduled for physical in March Future Appointments  Date Time Provider [place]  09/28/2019  1:00 PM Marin Olp, MD [facility]   Lab/Order associations:   ICD-10-CM   1. Type II diabetes mellitus with neurological manifestations (HCC)  E11.49 POCT HgB A1C  2. Angioedema due to angiotensin converting enzyme inhibitor (ACE-I)  T78.3XXA    T46.4X5A   3. Essential hypertension  I10   4. Dyslipidemia  E78.5   5. Hypothyroidism, unspecified type  E03.9   6. Major depressive disorder with single episode, in full remission ([place])  F32.5     Meds ordered this encounter  Medications  . predniSONE (DELTASONE) 20 MG tablet    Sig: Take 1 tablet by mouth daily for 5 days, then 1/2 tablet daily for 2 days    Dispense:  6 tablet    Refill:  0    Return precautions advised.  Garret Reddish, MD

## 2019-09-08 NOTE — Patient Instructions (Addendum)
Stay off quinapril-I have also listed this as an allergy.  Your blood pressure looks good right now but if he notices trending up at home please let us know  Could use light amount of Vaseline on the lips to keep them moisturized  Avoid using Carlton Adam in the future-you are beautiful just as you are!  Try course of prednisone to help reduce swelling in the lips-if not improving or symptoms worsen update [place] immediately  If you feel like you are having side effects of prednisone you may stop and let us know  Lets schedule regular follow-up in 4 months and we will do full blood work at that time

## 2019-09-12 ENCOUNTER — Ambulatory Visit: Payer: Medicare Other | Attending: Internal Medicine

## 2019-09-12 DIAGNOSIS — Z23 Encounter for immunization: Secondary | ICD-10-CM | POA: Insufficient documentation

## 2019-09-12 NOTE — Progress Notes (Signed)
   Covid-19 Vaccination Clinic  Name:  Kim Singh    MRN: [id number] DOB: 07-10-1941  09/12/2019  [name] was observed post Covid-19 immunization for 15 minutes without incidence. She was provided with Vaccine Information Sheet and instruction to access the V-Safe system.   Ms. Kill was instructed to call 911 with any severe reactions post vaccine: Marland Kitchen Difficulty breathing  . Swelling of your face and throat  . A fast heartbeat  . A bad rash all over your body  . Dizziness and weakness    Immunizations Administered    Name Date Dose VIS Date Route   Pfizer COVID-19 Vaccine 09/12/2019  9:40 AM 0.3 mL 07/03/2019 Intramuscular   Manufacturer: [place]   Lot: [id number]   [place]: [id number]

## 2019-09-23 ENCOUNTER — Ambulatory Visit (INDEPENDENT_AMBULATORY_CARE_PROVIDER_SITE_OTHER): Payer: Medicare Other | Admitting: Physician Assistant

## 2019-09-23 ENCOUNTER — Encounter: Payer: Self-pay | Admitting: Physician Assistant

## 2019-09-23 DIAGNOSIS — R220 Localized swelling, mass and lump, head: Secondary | ICD-10-CM | POA: Diagnosis not present

## 2019-09-23 MED ORDER — DESONIDE 0.05 % EX CREA: TOPICAL_CREAM | Freq: Two times a day (BID) | CUTANEOUS | 0 refills | Status: DC

## 2019-09-23 NOTE — Progress Notes (Signed)
Virtual Visit via Video   I connected with Kim Singh on 09/23/19 at 11:30 AM EST by a video enabled telemedicine application and verified that I am speaking with the correct person using two identifiers. Location patient: Home Location provider: [facility], Office Persons participating in the virtual visit: Vernie, Lamberson PA-C, Anselmo Pickler, LPN   I discussed the limitations of evaluation and management by telemedicine and the availability of in person appointments. The patient expressed understanding and agreed to proceed.  I acted as a Education administrator for [employer], PA-C [employer], LPN  Subjective:   HPI:   Lip swelling Seen by me on 2/10 for this and quinapril stopped, antihistamine and empiric valtrex used. She followed up with PCP on 2/16 for ongoing symptoms. She was started on oral prednisone. Prednisone did help but it came back on Saturday. Also has developed painful corners of her lips. She has been using vaseline to keep lips moisturized.  ROS: See pertinent positives and negatives per HPI.  Patient Active Problem List   Diagnosis Date Noted  . Angioedema due to angiotensin converting enzyme inhibitor (ACE-I) 09/08/2019  . Major depressive disorder with single episode, in full remission ([place]) 09/08/2019  . Insomnia 12/19/2018  . Grief reaction 05/16/2017  . Diabetic neuropathy ([place]) 08/22/2015  . Depression 02/28/2015  . Former smoker 02/28/2015  . History of breast cancer 11/30/2013  . History of thyroid cancer 11/30/2013  . Orthostatic hypotension 11/29/2013  . Dyslipidemia 11/29/2013  . Hypothyroidism 11/29/2013  . Type II diabetes mellitus with neurological manifestations ([place]) 11/29/2013  . Essential hypertension 11/29/2013    Social History   Tobacco Use  . Smoking status: Former Smoker    Packs/day: 1.00    Years: 41.00    Pack years: 41.00    Types: Cigarettes    Quit date: 08/02/1997    Years since quitting: 22.1  .  Smokeless tobacco: Never Used  Substance Use Topics  . Alcohol use: Yes    Alcohol/week: 7.0 standard drinks    Types: 7 Glasses of wine per week    Comment: glass of wine every day    Current Outpatient Medications:  .  CALCIUM-VITAMIN D PO, Take 1 tablet by mouth 2 (two) times daily. , Disp: , Rfl:  .  escitalopram (LEXAPRO) 20 MG tablet, Take 1 tablet (20 mg total) by mouth daily., Disp: 90 tablet, Rfl: 3 .  ezetimibe (ZETIA) 10 MG tablet, Take 1 tablet (10 mg total) by mouth daily., Disp: 90 tablet, Rfl: 3 .  fluticasone (FLONASE) 50 MCG/ACT nasal spray, USE 2 SPRAYS IN EACH NOSTRIL DAILY, Disp: 16 g, Rfl: 11 .  levothyroxine (SYNTHROID) 100 MCG tablet, Take 1 tablet (100 mcg total) by mouth daily., Disp: 90 tablet, Rfl: 3 .  metFORMIN (GLUCOPHAGE) 1000 MG tablet, Take 1 tablet (1,000 mg total) by mouth daily with breakfast., Disp: 90 tablet, Rfl: 3 .  nadolol (CORGARD) 20 MG tablet, TAKE 1 TABLET DAILY, Disp: 90 tablet, Rfl: 3 .  Omega-3-acid Ethyl Esters (OMACOR PO), Take 1 tablet by mouth 2 (two) times daily. , Disp: , Rfl:  .  rosuvastatin (CRESTOR) 10 MG tablet, Take 1 tablet (10 mg total) by mouth daily., Disp: 90 tablet, Rfl: 3 .  traZODone (DESYREL) 50 MG tablet, Take 0.5 tablets (25 mg total) by mouth at bedtime as needed for sleep., Disp: 45 tablet, Rfl: 3 .  desonide (DESOWEN) 0.05 % cream, Apply topically 2 (two) times daily., Disp: 30 g, Rfl: 0  Allergies  Allergen Reactions  . Codeine Other (See Comments)    GI upset  . Quinapril     Lip swelling/angioedema    Objective:   VITALS: Per patient if applicable, see vitals. GENERAL: Alert, appears well and in no acute distress. HEENT: Atraumatic, conjunctiva clear, no obvious abnormalities on inspection of external nose and ears. NECK: Normal movements of the head and neck. CARDIOPULMONARY: No increased WOB. Speaking in clear sentences. I:E ratio WNL.  MS: Moves all visible extremities without noticeable  abnormality. PSYCH: Pleasant and cooperative, well-groomed. Speech normal rate and rhythm. Affect is appropriate. Insight and judgement are appropriate. Attention is focused, linear, and appropriate.  NEURO: CN grossly intact. Oriented as arrived to appointment on time with no prompting. Moves both UE equally.  SKIN: Very slight swelling of lower lip  Assessment and Plan:   Kim Singh was seen today for oral swelling.  Diagnoses and all orders for this visit:  Lip swelling Ongoing. No red flags on discussion. Possible cheilitis? Unclear etiology, will trial desonide and refer to ENT for further evaluation and management. Worsening precautions advised. -     Ambulatory referral to ENT  Other orders -     desonide (DESOWEN) 0.05 % cream; Apply topically 2 (two) times daily.  . Reviewed expectations re: course of current medical issues. . Discussed self-management of symptoms. . Outlined signs and symptoms indicating need for more acute intervention. . Patient verbalized understanding and all questions were answered. Marland Kitchen Health Maintenance issues including appropriate healthy diet, exercise, and smoking avoidance were discussed with patient. . See orders for this visit as documented in the electronic medical record.  I discussed the assessment and treatment plan with the patient. The patient was provided an opportunity to ask questions and all were answered. The patient agreed with the plan and demonstrated an understanding of the instructions.   The patient was advised to call back or seek an in-person evaluation if the symptoms worsen or if the condition fails to improve as anticipated.   CMA or LPN served as scribe during this visit. History, Physical, and Plan performed by medical provider. The above documentation has been reviewed and is accurate and complete.   [place], [place] 09/23/2019

## 2019-09-25 ENCOUNTER — Other Ambulatory Visit: Payer: Self-pay

## 2019-09-28 ENCOUNTER — Ambulatory Visit (INDEPENDENT_AMBULATORY_CARE_PROVIDER_SITE_OTHER): Payer: Medicare Other

## 2019-09-28 ENCOUNTER — Other Ambulatory Visit: Payer: Self-pay

## 2019-09-28 ENCOUNTER — Ambulatory Visit (INDEPENDENT_AMBULATORY_CARE_PROVIDER_SITE_OTHER): Payer: Medicare Other | Admitting: Family Medicine

## 2019-09-28 VITALS — BP 130/80 | Temp 97.9°F | Ht 68.0 in | Wt 179.9 lb

## 2019-09-28 VITALS — BP 130/80 | Temp 97.9°F | Ht 68.0 in | Wt 179.8 lb

## 2019-09-28 DIAGNOSIS — E1149 Type 2 diabetes mellitus with other diabetic neurological complication: Secondary | ICD-10-CM

## 2019-09-28 DIAGNOSIS — I10 Essential (primary) hypertension: Secondary | ICD-10-CM

## 2019-09-28 DIAGNOSIS — E785 Hyperlipidemia, unspecified: Secondary | ICD-10-CM

## 2019-09-28 DIAGNOSIS — Z0000 Encounter for general adult medical examination without abnormal findings: Secondary | ICD-10-CM | POA: Diagnosis not present

## 2019-09-28 DIAGNOSIS — E039 Hypothyroidism, unspecified: Secondary | ICD-10-CM

## 2019-09-28 LAB — COMPREHENSIVE METABOLIC PANEL
ALT: 23 U/L (ref 0–35)
AST: 21 U/L (ref 0–37)
Albumin: 4.1 g/dL (ref 3.5–5.2)
Alkaline Phosphatase: 61 U/L (ref 39–117)
BUN: 15 mg/dL (ref 6–23)
CO2: 36 mEq/L — ABNORMAL HIGH (ref 19–32)
Calcium: 10.2 mg/dL (ref 8.4–10.5)
Chloride: 101 mEq/L (ref 96–112)
Creatinine, Ser: 0.91 mg/dL (ref 0.40–1.20)
GFR: 59.35 mL/min — ABNORMAL LOW (ref >60.00)
Glucose, Bld: 140 mg/dL — ABNORMAL HIGH (ref 70–99)
Potassium: 4.5 mEq/L (ref 3.5–5.1)
Sodium: 141 mEq/L (ref 135–145)
Total Bilirubin: 1.2 mg/dL (ref 0.2–1.2)
Total Protein: 6.7 g/dL (ref 6.0–8.3)

## 2019-09-28 LAB — CBC WITH DIFFERENTIAL/PLATELET
Basophils Absolute: 0.1 10*3/uL (ref 0.0–0.1)
Basophils Relative: 1.2 % (ref 0.0–3.0)
Eosinophils Absolute: 0.2 10*3/uL (ref 0.0–0.7)
Eosinophils Relative: 2.7 % (ref 0.0–5.0)
HCT: 42.4 % (ref 36.0–46.0)
Hemoglobin: 14.5 g/dL (ref 12.0–15.0)
Lymphocytes Relative: 21.2 % (ref 12.0–46.0)
Lymphs Abs: 1.4 10*3/uL (ref 0.7–4.0)
MCHC: 34.2 g/dL (ref 30.0–36.0)
MCV: 92.7 fl (ref 78.0–100.0)
Monocytes Absolute: 0.9 10*3/uL (ref 0.1–1.0)
Monocytes Relative: 13.1 % — ABNORMAL HIGH (ref 3.0–12.0)
Neutro Abs: 4.2 10*3/uL (ref 1.4–7.7)
Neutrophils Relative %: 61.8 % (ref 43.0–77.0)
Platelets: 178 10*3/uL (ref 150.0–400.0)
RBC: 4.58 Mil/uL (ref 3.87–5.11)
RDW: 13.6 % (ref 11.5–15.5)
WBC: 6.8 10*3/uL (ref 4.0–10.5)

## 2019-09-28 LAB — LIPID PANEL
Cholesterol: 112 mg/dL (ref 0–200)
HDL: 40.8 mg/dL (ref >39.00)
NonHDL: 71.18
Total CHOL/HDL Ratio: 3
Triglycerides: 342 mg/dL — ABNORMAL HIGH (ref 0.0–149.0)
VLDL: 68.4 mg/dL — ABNORMAL HIGH (ref 0.0–40.0)

## 2019-09-28 LAB — MICROALBUMIN / CREATININE URINE RATIO
Creatinine,U: 163.8 mg/dL
Microalb Creat Ratio: 5.1 mg/g (ref 0.0–30.0)
Microalb, Ur: 8.3 mg/dL — ABNORMAL HIGH (ref 0.0–1.9)

## 2019-09-28 LAB — LDL CHOLESTEROL, DIRECT: Direct LDL: 40 mg/dL

## 2019-09-28 LAB — TSH: TSH: 0.38 u[IU]/mL (ref 0.35–4.50)

## 2019-09-28 MED ORDER — PREDNISONE 20 MG PO TABS: ORAL_TABLET | ORAL | 0 refills | Status: DC

## 2019-09-28 NOTE — Progress Notes (Deleted)
Phone: [phone]    Subjective:  Patient presents today for their annual wellness visit (initial  subsequent last done 09/26/2018 )  Preventive Screening-Counseling & Management  Modifiable Risk Factors/behavioral risk assessment/psychosocial risk assessment Regular exercise: *** Diet: ***  Wt Readings from Last 3 Encounters:  09/08/19 182 lb 12.8 oz (82.9 kg)  03/31/19 182 lb (82.6 kg)  12/19/18 181 lb (82.1 kg)   Smoking Status: ***Never Smoker Second Hand Smoking status: ***No smokers in home Alcohol intake: *** per week  Cardiac risk factors:  advanced age (older than 103 for men, 7 for women) *** *** Hyperlipidemia *** *** Hypertension *** No diabetes. *** Lab Results  Component Value Date   HGBA1C 6.2 (A) 09/08/2019   Family History: ***  Family History  Problem Relation Age of Onset  . Diabetes Mother   . Hearing loss Father   . Depression Son   . Cancer Sister        Bile Duct Cancer    Depression Screen/risk evaluation Risk factors: ***.. PHQ2 0 *** Depression screen [facility] 2/9 09/08/2019 03/31/2019 12/19/2018 09/03/2018 03/11/2018  Decreased Interest 0 0 0 0 0  Down, Depressed, Hopeless 0 0 0 0 0  PHQ - 2 Score 0 0 0 0 0  Altered sleeping 0 0 0 1 0  Tired, decreased energy 0 0 1 1 1   Change in appetite 1 0 0 0 0  Feeling bad or failure about yourself  0 0 0 0 0  Trouble concentrating 0 0 0 0 0  Moving slowly or fidgety/restless 0 0 0 0 0  Suicidal thoughts 0 0 0 0 0  PHQ-9 Score 1 0 1 2 1   Difficult doing work/chores Not difficult at all - Not difficult at all Not difficult at all Not difficult at all    Functional ability and level of safety Mobility assessment: *** timed get up and go <12 seconds Activities of Daily Living- Independent in ADLs (toileting, bathing, dressing, transferring, eating) and in IADLs (shopping, housekeeping, managing own medications, and handling finances)*** Home Safety: Loose rugs (***), smoke detectors (***), small pets  (***), grab bars (***), stairs (***), life-alert system (***) Hearing Difficulties: ***-patient declines Fall Risk: ***None  Fall Risk  09/26/2018 09/03/2018 06/09/2018 04/23/2017 05/08/2016  Falls in the past year? 0 1 1 Yes Yes  Comment - Slipped down the stairs Emmi Telephone Survey: data to providers prior to load - -  Number falls in past yr: - 0 1 1 1   Comment - - Emmi Telephone Survey Actual Response = 1 - -  Injury with Fall? - 0 1 No -  Follow up - - - Education provided;Falls prevention discussed Education provided   Opioid use history: *** no long term opioids use Self assessment of health status: ***  Cognitive Testing             No reported trouble. ***  Mini cog: *** clock draw. ***/3 delayed recall. Normal test result ***   ***"Please listen carefully. I am going to say three words that I want you to repeat back to me now and try to remember. The words are ***banana sunrise chair *** leader, season, table  ***village kitchen baby *** Please say them for me now."  *** "Next, I want you to draw a clock for me. First, put in all of the numbers where they go."   *** "Now, set the hands to 10 past 11."  List the Names of Other Physician/Practitioners you currently use:  Patient Care Team: Marin Olp, MD as PCP - General (Family Medicine) -***  Required Immunizations needed today:  *** Immunization History  Administered Date(s) Administered  . Fluad Quad(high Dose 65+) 03/31/2019  . Influenza, High Dose Seasonal PF 06/28/2014, 04/06/2016, 05/13/2017, 09/03/2018  . PFIZER SARS-COV-2 Vaccination 09/12/2019  . Pneumococcal Conjugate-13 06/28/2014, 04/17/2016  . Pneumococcal Polysaccharide-23 08/22/2015  . Tdap 04/17/2016   Health Maintenance  Topic Date Due  . Urine Protein Check  12/09/2015  . Eye exam for diabetics  12/23/2019  . Hemoglobin A1C  03/07/2020  . Complete foot exam   09/07/2020  . Tetanus Vaccine  04/17/2026  . Flu Shot  Completed  . DEXA scan  (bone density measurement)  Completed  . Pneumonia vaccines  Completed    Screening tests-  Health Maintenance Due  Topic Date Due  . URINE MICROALBUMIN  12/09/2015   1. Colon cancer screening- Aged out 2. Lung Cancer screening- *** 3. Skin cancer screening- *** 4. Prostate cancer screening No results found for: PSA  4. Cervical cancer screening- Aged out  5. Breast cancer screening- ***   The following were reviewed and entered/updated in epic if appropriate: Past Medical History:  Diagnosis Date  . Breast cancer ([place]) 2001   Left  . Diabetes mellitus type 2, uncomplicated ([place])   . Personal history of chemotherapy   . Personal history of radiation therapy   . Pure hypercholesterolemia   . Unspecified disorder of thyroid   . Unspecified essential hypertension    Patient Active Problem List   Diagnosis Date Noted  . Angioedema due to angiotensin converting enzyme inhibitor (ACE-I) 09/08/2019  . Major depressive disorder with single episode, in full remission ([place]) 09/08/2019  . Insomnia 12/19/2018  . Grief reaction 05/16/2017  . Diabetic neuropathy ([place]) 08/22/2015  . Depression 02/28/2015  . Former smoker 02/28/2015  . History of breast cancer 11/30/2013  . History of thyroid cancer 11/30/2013  . Orthostatic hypotension 11/29/2013  . Dyslipidemia 11/29/2013  . Hypothyroidism 11/29/2013  . Type II diabetes mellitus with neurological manifestations ([place]) 11/29/2013  . Essential hypertension 11/29/2013   Past Surgical History:  Procedure Laterality Date  . BREAST EXCISIONAL BIOPSY Left   . BREAST LUMPECTOMY Left 2001  . THYROIDECTOMY     total    Family History  Problem Relation Age of Onset  . Diabetes Mother   . Hearing loss Father   . Depression Son   . Cancer Sister        Bile Duct Cancer    Medications- reviewed and updated Current Outpatient Medications  Medication Sig Dispense Refill  . CALCIUM-VITAMIN D PO Take 1 tablet by mouth 2 (two) times  daily.     Marland Kitchen desonide (DESOWEN) 0.05 % cream Apply topically 2 (two) times daily. 30 g 0  . escitalopram (LEXAPRO) 20 MG tablet Take 1 tablet (20 mg total) by mouth daily. 90 tablet 3  . ezetimibe (ZETIA) 10 MG tablet Take 1 tablet (10 mg total) by mouth daily. 90 tablet 3  . fluticasone (FLONASE) 50 MCG/ACT nasal spray USE 2 SPRAYS IN EACH NOSTRIL DAILY 16 g 11  . levothyroxine (SYNTHROID) 100 MCG tablet Take 1 tablet (100 mcg total) by mouth daily. 90 tablet 3  . metFORMIN (GLUCOPHAGE) 1000 MG tablet Take 1 tablet (1,000 mg total) by mouth daily with breakfast. 90 tablet 3  . nadolol (CORGARD) 20 MG tablet TAKE 1 TABLET DAILY 90 tablet 3  . Omega-3-acid Ethyl Esters (OMACOR PO) Take 1  tablet by mouth 2 (two) times daily.     . rosuvastatin (CRESTOR) 10 MG tablet Take 1 tablet (10 mg total) by mouth daily. 90 tablet 3  . traZODone (DESYREL) 50 MG tablet Take 0.5 tablets (25 mg total) by mouth at bedtime as needed for sleep. 45 tablet 3   No current facility-administered medications for this visit.    Allergies-reviewed and updated Allergies  Allergen Reactions  . Codeine Other (See Comments)    GI upset  . Quinapril     Lip swelling/angioedema    Social History   Socioeconomic History  . Marital status: Widowed    Spouse name: Not on file  . Number of children: Not on file  . Years of education: Not on file  . Highest education level: Not on file  Occupational History  . Not on file  Tobacco Use  . Smoking status: Former Smoker    Packs/day: 1.00    Years: 41.00    Pack years: 41.00    Types: Cigarettes    Quit date: 08/02/1997    Years since quitting: 22.1  . Smokeless tobacco: Never Used  Substance and Sexual Activity  . Alcohol use: Yes    Alcohol/week: 7.0 standard drinks    Types: 7 Glasses of wine per week    Comment: glass of wine every day  . Drug use: No  . Sexual activity: Not Currently  Other Topics Concern  . Not on file  Social History Narrative    Widowed 2008. 3 children. 4 grandkids. No greatgrandkids.    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       Hobbies: in [place] had a widowed group, here joined a church with kids, Psychologist, occupational work. Looking for friends network   Social Determinants of Health   Financial Resource Strain:   . Difficulty of Paying Living Expenses: Not on file  Food Insecurity:   . Worried About Charity fundraiser in the Last Year: Not on file  . Ran Out of Food in the Last Year: Not on file  Transportation Needs:   . Lack of Transportation (Medical): Not on file  . Lack of Transportation (Non-Medical): Not on file  Physical Activity:   . Days of Exercise per Week: Not on file  . Minutes of Exercise per Session: Not on file  Stress:   . Feeling of Stress : Not on file  Social Connections:   . Frequency of Communication with Friends and Family: Not on file  . Frequency of Social Gatherings with Friends and Family: Not on file  . Attends Religious Services: Not on file  . Active Member of Clubs or Organizations: Not on file  . Attends Archivist Meetings: Not on file  . Marital Status: Not on file      Objective:  There were no vitals taken for this visit. Gen: NAD, resting comfortably HEENT: Mucous membranes are moist. Oropharynx normal Neck: no thyromegaly CV: RRR no murmurs rubs or gallops Lungs: CTAB no crackles, wheeze, rhonchi Abdomen: soft/nontender/nondistended/normal bowel sounds. No rebound or guarding.  Ext: no edema Skin: warm, dry Neuro: grossly normal, moves all extremities, PERRLA***   Assessment/Plan:  AWV completed 1. Educated, counseled and referred based on above elements 2. Educated, counseled and referred as appropriate for preventative needs 3. Discussed and documented a written plan for preventiative  services and screenings  with personalized health advice- After Visit Summary was given to patient which included this plan ***  Status of chronic or acute concerns  ***  No problem-specific Assessment & Plan notes found for this encounter.   Recommended follow up: ***No follow-ups on file. Future Appointments  Date Time Provider [place]  09/28/2019  1:00 PM Marin Olp, MD [facility]  09/28/2019  1:30 PM Bernita Raisin, LPN [facility]  [id number] 10:30 AM Rozetta Nunnery, MD [facility] None  10/06/2019 10:30 AM [facility]     Lab/Order associations: No diagnosis found.  No orders of the defined types were placed in this encounter.   Return precautions advised.  Francella Solian, CMA

## 2019-09-28 NOTE — Patient Instructions (Signed)
Kim Singh , Thank you for taking time to come for your Medicare Wellness Visit. I appreciate your ongoing commitment to your health goals. Please review the following plan we discussed and let me know if I can assist you in the future.   Screening recommendations/referrals: Colorectal Screening: up to date; last 03/20/12 (no longer indicated)  Mammogram: up to date; last 03/12/17 (no longer indicated)  Bone Density: repeat recommended; last 10/16/11  Vision and Dental Exams: Recommended annual ophthalmology exams for early detection of glaucoma and other disorders of the eye Recommended annual dental exams for proper oral hygiene  Vaccinations: Influenza vaccine: completed 03/31/19 Pneumococcal vaccine: up to date; last 04/17/16 Tdap vaccine: up to date; last 04/17/16  Shingles vaccine: You may receive this vaccine at your local pharmacy. (see handout)   Advanced directives: Please bring a copy of your POA (Power of Attorney) and/or Living Will to your next appointment.  Goals: Recommend to drink at least 6-8 8oz glasses of water per day and consume a balanced diet rich in fresh fruits and vegetables.   Next appointment: Please schedule your Annual Wellness Visit with your Nurse Health Advisor in one year.  Preventive Care 1 Years and Older, Female Preventive care refers to lifestyle choices and visits with your health care provider that can promote health and wellness. What does preventive care include?  A yearly physical exam. This is also called an annual well check.  Dental exams once or twice a year.  Routine eye exams. Ask your health care provider how often you should have your eyes checked.  Personal lifestyle choices, including:  Daily care of your teeth and gums.  Regular physical activity.  Eating a healthy diet.  Avoiding tobacco and drug use.  Limiting alcohol use.  Practicing safe sex.  Taking low-dose aspirin every day if recommended by your health care  provider.  Taking vitamin and mineral supplements as recommended by your health care provider. What happens during an annual well check? The services and screenings done by your health care provider during your annual well check will depend on your age, overall health, lifestyle risk factors, and family history of disease. Counseling  Your health care provider may ask you questions about your:  Alcohol use.  Tobacco use.  Drug use.  Emotional well-being.  Home and relationship well-being.  Sexual activity.  Eating habits.  History of falls.  Memory and ability to understand (cognition).  Work and work Statistician.  Reproductive health. Screening  You may have the following tests or measurements:  Height, weight, and BMI.  Blood pressure.  Lipid and cholesterol levels. These may be checked every 5 years, or more frequently if you are over 44 years old.  Skin check.  Lung cancer screening. You may have this screening every year starting at age 1 if you have a 30-pack-year history of smoking and currently smoke or have quit within the past 15 years.  Fecal occult blood test (FOBT) of the stool. You may have this test every year starting at age 60.  Flexible sigmoidoscopy or colonoscopy. You may have a sigmoidoscopy every 5 years or a colonoscopy every 10 years starting at age 34.  Hepatitis C blood test.  Hepatitis B blood test.  Sexually transmitted disease (STD) testing.  Diabetes screening. This is done by checking your blood sugar (glucose) after you have not eaten for a while (fasting). You may have this done every 1-3 years.  Bone density scan. This is done to screen for osteoporosis.  You may have this done starting at age 48.  Mammogram. This may be done every 1-2 years. Talk to your health care provider about how often you should have regular mammograms. Talk with your health care provider about your test results, treatment options, and if necessary, the  need for more tests. Vaccines  Your health care provider may recommend certain vaccines, such as:  Influenza vaccine. This is recommended every year.  Tetanus, diphtheria, and acellular pertussis (Tdap, Td) vaccine. You may need a Td booster every 10 years.  Zoster vaccine. You may need this after age 94.  Pneumococcal 13-valent conjugate (PCV13) vaccine. One dose is recommended after age 72.  Pneumococcal polysaccharide (PPSV23) vaccine. One dose is recommended after age 72. Talk to your health care provider about which screenings and vaccines you need and how often you need them. This information is not intended to replace advice given to you by your health care provider. Make sure you discuss any questions you have with your health care provider. Document Released: 08/05/2015 Document Revised: 03/28/2016 Document Reviewed: 05/10/2015 Elsevier Interactive Patient Education  2017 [place] Prevention in the Home Falls can cause injuries. They can happen to people of all ages. There are many things you can do to make your home safe and to help prevent falls. What can I do on the outside of my home?  Regularly fix the edges of walkways and driveways and fix any cracks.  Remove anything that might make you trip as you walk through a door, such as a raised step or threshold.  Trim any bushes or trees on the path to your home.  Use bright outdoor lighting.  Clear any walking paths of anything that might make someone trip, such as rocks or tools.  Regularly check to see if handrails are loose or broken. Make sure that both sides of any steps have handrails.  Any raised decks and porches should have guardrails on the edges.  Have any leaves, snow, or ice cleared regularly.  Use sand or salt on walking paths during winter.  Clean up any spills in your garage right away. This includes oil or grease spills. What can I do in the bathroom?  Use night lights.  Install grab  bars by the toilet and in the tub and shower. Do not use towel bars as grab bars.  Use non-skid mats or decals in the tub or shower.  If you need to sit down in the shower, use a plastic, non-slip stool.  Keep the floor dry. Clean up any water that spills on the floor as soon as it happens.  Remove soap buildup in the tub or shower regularly.  Attach bath mats securely with double-sided non-slip rug tape.  Do not have throw rugs and other things on the floor that can make you trip. What can I do in the bedroom?  Use night lights.  Make sure that you have a light by your bed that is easy to reach.  Do not use any sheets or blankets that are too big for your bed. They should not hang down onto the floor.  Have a firm chair that has side arms. You can use this for support while you get dressed.  Do not have throw rugs and other things on the floor that can make you trip. What can I do in the kitchen?  Clean up any spills right away.  Avoid walking on wet floors.  Keep items that you use a  lot in easy-to-reach places.  If you need to reach something above you, use a strong step stool that has a grab bar.  Keep electrical cords out of the way.  Do not use floor polish or wax that makes floors slippery. If you must use wax, use non-skid floor wax.  Do not have throw rugs and other things on the floor that can make you trip. What can I do with my stairs?  Do not leave any items on the stairs.  Make sure that there are handrails on both sides of the stairs and use them. Fix handrails that are broken or loose. Make sure that handrails are as long as the stairways.  Check any carpeting to make sure that it is firmly attached to the stairs. Fix any carpet that is loose or worn.  Avoid having throw rugs at the top or bottom of the stairs. If you do have throw rugs, attach them to the floor with carpet tape.  Make sure that you have a light switch at the top of the stairs and the  bottom of the stairs. If you do not have them, ask someone to add them for you. What else can I do to help prevent falls?  Wear shoes that:  Do not have high heels.  Have rubber bottoms.  Are comfortable and fit you well.  Are closed at the toe. Do not wear sandals.  If you use a stepladder:  Make sure that it is fully opened. Do not climb a closed stepladder.  Make sure that both sides of the stepladder are locked into place.  Ask someone to hold it for you, if possible.  Clearly mark and make sure that you can see:  Any grab bars or handrails.  First and last steps.  Where the edge of each step is.  Use tools that help you move around (mobility aids) if they are needed. These include:  Canes.  Walkers.  Scooters.  Crutches.  Turn on the lights when you go into a dark area. Replace any light bulbs as soon as they burn out.  Set up your furniture so you have a clear path. Avoid moving your furniture around.  If any of your floors are uneven, fix them.  If there are any pets around you, be aware of where they are.  Review your medicines with your doctor. Some medicines can make you feel dizzy. This can increase your chance of falling. Ask your doctor what other things that you can do to help prevent falls. This information is not intended to replace advice given to you by your health care provider. Make sure you discuss any questions you have with your health care provider. Document Released: 05/05/2009 Document Revised: 12/15/2015 Document Reviewed: 08/13/2014 Elsevier Interactive Patient Education  2017 [employer].

## 2019-09-28 NOTE — Progress Notes (Signed)
Phone [phone] In person visit   Subjective:   Kim Singh is a 79 y.o. year old very pleasant female patient who presents for/with See problem oriented charting Chief Complaint  Patient presents with  . Depression  . Diabetes   This visit occurred during the SARS-CoV-2 public health emergency.  Safety protocols were in place, including screening questions prior to the visit, additional usage of staff PPE, and extensive cleaning of exam room while observing appropriate contact time as indicated for disinfecting solutions.   Past Medical History-  Patient Active Problem List   Diagnosis Date Noted  . Angioedema due to angiotensin converting enzyme inhibitor (ACE-I) 09/08/2019    Priority: High  . Type II diabetes mellitus with neurological manifestations ([place]) 11/29/2013    Priority: High  . Diabetic neuropathy ([place]) 08/22/2015    Priority: Medium  . Depression 02/28/2015    Priority: Medium  . Dyslipidemia 11/29/2013    Priority: Medium  . Hypothyroidism 11/29/2013    Priority: Medium  . Essential hypertension 11/29/2013    Priority: Medium  . Former smoker 02/28/2015    Priority: Low  . History of breast cancer 11/30/2013    Priority: Low  . History of thyroid cancer 11/30/2013    Priority: Low  . Orthostatic hypotension 11/29/2013    Priority: Low  . Major depressive disorder with single episode, in full remission ([place]) 09/08/2019  . Insomnia 12/19/2018  . Grief reaction 05/16/2017    Medications- reviewed and updated Current Outpatient Medications  Medication Sig Dispense Refill  . CALCIUM-VITAMIN D PO Take 1 tablet by mouth 2 (two) times daily.     Marland Kitchen desonide (DESOWEN) 0.05 % cream Apply topically 2 (two) times daily. 30 g 0  . escitalopram (LEXAPRO) 20 MG tablet Take 1 tablet (20 mg total) by mouth daily. 90 tablet 3  . ezetimibe (ZETIA) 10 MG tablet Take 1 tablet (10 mg total) by mouth daily. 90 tablet 3  . fluticasone (FLONASE) 50 MCG/ACT nasal spray  USE 2 SPRAYS IN EACH NOSTRIL DAILY 16 g 11  . levothyroxine (SYNTHROID) 100 MCG tablet Take 1 tablet (100 mcg total) by mouth daily. 90 tablet 3  . metFORMIN (GLUCOPHAGE) 1000 MG tablet Take 1 tablet (1,000 mg total) by mouth daily with breakfast. 90 tablet 3  . nadolol (CORGARD) 20 MG tablet TAKE 1 TABLET DAILY 90 tablet 3  . Omega-3-acid Ethyl Esters (OMACOR PO) Take 1 tablet by mouth 2 (two) times daily.     . rosuvastatin (CRESTOR) 10 MG tablet Take 1 tablet (10 mg total) by mouth daily. 90 tablet 3  . traZODone (DESYREL) 50 MG tablet Take 0.5 tablets (25 mg total) by mouth at bedtime as needed for sleep. 45 tablet 3  . predniSONE (DELTASONE) 20 MG tablet Take 1 pills for 6 days, 1/2 pill for 6 days, 1/2 pill every other day until finished. Do not take until visit with Dr. Lucia Gaskins- want his opinion 12 tablet 0   No current facility-administered medications for this visit.     Objective:  BP 130/80   Temp 97.9 F (36.6 C) (Temporal)   Ht 5\' 8"  (1.727 m)   Wt 179 lb 12.8 oz (81.6 kg)   BMI 27.34 kg/m  Gen: NAD, resting comfortably Lips appear largely to be at the baseline-patient reports mild swelling and erythema around the lips still CV: RRR no murmurs rubs or gallops Lungs: CTAB no crackles, wheeze, rhonchi Ext: no edema Skin: warm, dry .Neuro: CN II-XII intact, sensation and  reflexes normal throughout, 5/5 muscle strength in bilateral upper and lower extremities. Normal finger to nose. Normal rapid alternating movements. No pronator drift. Normal romberg. Normal gait.     Assessment and Plan   #Lip swelling-patient has had 3 prior visits for lip swelling.  First seen by Inda Coke February 10-patient was taken off of quinapril for potential angioedema.  Also at that time thought possibly a reaction to [place] which she had used recently.  She was also treated for cold sores with Valtrex to see if that have been a possible trigger.  Patient saw me on February 16-symptoms were  improving but not as rapidly as patient would have liked and A1c was okay so we tried a course of prednisone-she found that helpful but a few weeks later symptoms worsened and she saw Inda Coke again on March 3 and was prescribed desonide which seems to be helpful-also referred to ENT given ongoing issues for over a month -Patient has upcoming visit in 2 days with Dr. Judene Companion encouraged her to follow through with this.  She reports to me that she did have significant improvement on prednisone so I did prescribe a prolonged taper but we are going to get Dr. Pollie Friar opinion before starting this-I think her A1c can tolerate this.  I would also be open his opinion about possibly seeing an allergist for possible trigger for ongoing lip swelling since ACE inhibitor has been removed for about a month  #Diabetes with neuropathy.  S:  on Metformin 1000 mg daily . Not on medicine for neuropathy- doesn't want to take anything. Does not check blood sugars at home.  Lab Results  Component Value Date   HGBA1C 6.2 (A) 09/08/2019   HGBA1C 5.9 (A) 03/31/2019   HGBA1C 6.0 (A) 09/03/2018  A/P: Stable. Continue current medications.    #Hypertension S: controlled on nadolol 20 mg. She is off of quinapril as of September 02, 2019 due to suspected angioedema. Does not check at home but has noticed increased headaches after change in medications. Patient denies any chest pain, shortness of breath, changes or changes in vision. Does add salt to food occasionally. Tries to maintain a heart healthy diet. She does not exercise regularly.  A/P: Excellent control today-continue current medications  -slight headache since coming off qunapril- from avs "Lets have you do some home monitoring on blood pressure and update me in 1-2 weeks by mychart. If #s are high at home consider adding alternate medicine like amlodipine 2.5mg  to your regimen"  - she reports having intermittent headaches when she was younger but not  recently -Reassuring neurological exam today.  We discussed if no improvement in coming weeks with blood pressure evaluation and monitoring consider neuroimaging.  I also wonder if home stressors are contributing as she reports a fair amount of stress in the household  Depression  s: Compliant with Lexapro 20 mg.  Loss of son to suicide in October 2018 contributes. PhQ9 today was 1. Denies any suicidal thoughts.  A/P:  Full remission-continue current medication  #Hyperlipidemia S: Compliant with Zetia 10 mg and Crestor 10 mg daily. No missed doses. Denies any leg cramping  Lab Results  Component Value Date   CHOL 110 09/26/2018   HDL 43.30 09/26/2018   LDLCALC 36 09/26/2018   LDLDIRECT 43.0 04/23/2017   TRIG 151.0 (H) 09/26/2018   CHOLHDL 3 09/26/2018   A/P: excellent control last visit- if LDL still below 40, consider stopping zetia nad rechecking cholesterol at next visit   #  Hypothyroidism after total thyroidectomy after history thyroid cancer S: Compliant with levothyroxine 100 mcg Lab Results  Component Value Date   TSH 0.38 05/19/2019  A/P: hopefully controlled- update with labs today.    #slight tension left nec/shoulder area- does feel slightly tight on exam. Heat helps. Asks about massage- I think that's a good idea  Recommended follow up: Return in about 4 months (around 01/28/2020) for follow up- or sooner if needed. Future Appointments  Date Time Provider [place]  09/30/2019 10:30 AM Rozetta Nunnery, MD [facility] None  10/06/2019 10:30 AM [facility]    Lab/Order associations:   ICD-10-CM   1. Hypothyroidism, unspecified type  E03.9 TSH  2. Type II diabetes mellitus with neurological manifestations (HCC)  E11.49 Microalbumin / creatinine urine ratio    CBC with Differential/Platelet    Comprehensive metabolic panel    Lipid panel  3. Dyslipidemia  E78.5 Lipid panel  4. Essential hypertension  I10 Comprehensive metabolic  panel    Meds ordered this encounter  Medications  . predniSONE (DELTASONE) 20 MG tablet    Sig: Take 1 pills for 6 days, 1/2 pill for 6 days, 1/2 pill every other day until finished. Do not take until visit with Dr. Lucia Gaskins- want his opinion    Dispense:  12 tablet    Refill:  0   Return precautions advised.  Garret Reddish, MD

## 2019-09-28 NOTE — Patient Instructions (Addendum)
Health Maintenance Due  Topic Date Due  . URINE MICROALBUMIN  Will order today  12/09/2015   Lets have you do some home monitoring on blood pressure and update me in 1-2 weeks by mychart. If #s are high at home consider adding alternate medicine like amlodipine 2.5mg  to your regimen  May try prednisone again but lets get Dr. Pollie Friar opinion first- also ask him his thoughts on possible allergy referrl  Please stop by lab before you go If you do not have mychart- we will call you about results within 5 business days of [place] receiving them.  If you have mychart- we will send your results within 3 business days of [place] receiving them.  If abnormal or we want to clarify a result, we will call or mychart you to make sure you receive the message.  If you have questions or concerns or don't hear within 5 business days, please send [place] a message or call us.    Recommended follow up: Return in about 4 months (around 01/28/2020) for follow up- or sooner if needed.

## 2019-09-28 NOTE — Progress Notes (Signed)
Subjective:   Kim Singh is a 79 y.o. female who presents for Medicare Annual (Subsequent) preventive examination.  Review of Systems:   Cardiac Risk Factors include: advanced age (>73mn, >>23women);diabetes mellitus;dyslipidemia;hypertension    Objective:     Vitals: BP 130/80   Temp 97.9 F (36.6 C) (Temporal)   Ht 5' 8"  (1.727 m)   Wt 179 lb 14.3 oz (81.6 kg)   BMI 27.35 kg/m   Body mass index is 27.35 kg/m.  Advanced Directives 09/28/2019 09/03/2018 04/23/2017 07/22/2016 05/08/2016  Does Patient Have a Medical Advance Directive? Yes Yes Yes Yes Yes  Type of Advance Directive Living will;Healthcare Power of [place] will [place]-  Does patient want to make changes to medical advance directive? No - Patient declined No - Patient declined Yes (MAU/Ambulatory/Procedural Areas - Information given) - -  Copy of [place] Chart? No - copy requested No - copy requested No - copy requested No - copy requested -  Would patient like information on creating a medical advance directive? - - - No - Patient declined -    Tobacco Social History   Tobacco Use  Smoking Status Former Smoker  . Packs/day: 1.00  . Years: 41.00  . Pack years: 41.00  . Types: Cigarettes  . Quit date: 08/02/1997  . Years since quitting: 22.1  Smokeless Tobacco Never Used     Counseling given: Not Answered   Clinical Intake:  Pre-visit preparation completed: Yes  Pain : No/denies pain  Diabetes: No  How often do you need to have someone help you when you read instructions, pamphlets, or other written materials from your doctor or pharmacy?: 1 - Never  Interpreter Needed?: No  Information entered by :: CDenman GeorgeLPN  Past Medical History:  Diagnosis Date  . Breast cancer ([place] 2001   Left  . Diabetes mellitus type 2, uncomplicated ([place]   . Personal history of  chemotherapy   . Personal history of radiation therapy   . Pure hypercholesterolemia   . Unspecified disorder of thyroid   . Unspecified essential hypertension    Past Surgical History:  Procedure Laterality Date  . BREAST EXCISIONAL BIOPSY Left   . BREAST LUMPECTOMY Left 2001  . THYROIDECTOMY     total   Family History  Problem Relation Age of Onset  . Diabetes Mother   . Hearing loss Father   . Depression Son   . Cancer Sister        Bile Duct Cancer   Social History   Socioeconomic History  . Marital status: Widowed    Spouse name: Not on file  . Number of children: Not on file  . Years of education: Not on file  . Highest education level: Not on file  Occupational History  . Not on file  Tobacco Use  . Smoking status: Former Smoker    Packs/day: 1.00    Years: 41.00    Pack years: 41.00    Types: Cigarettes    Quit date: 08/02/1997    Years since quitting: 22.1  . Smokeless tobacco: Never Used  Substance and Sexual Activity  . Alcohol use: Yes    Alcohol/week: 7.0 standard drinks    Types: 7 Glasses of wine per week    Comment: glass of wine every day  . Drug use: No  . Sexual activity: Not Currently  Other Topics Concern  . Not  on file  Social History Narrative   Widowed 2008. 3 children. 4 grandkids. No greatgrandkids.    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       Hobbies: in [place] had a widowed group, here joined a church with kids, Psychologist, occupational work. Looking for friends network   Social Determinants of Health   Financial Resource Strain:   . Difficulty of Paying Living Expenses: Not on file  Food Insecurity:   . Worried About Charity fundraiser in the Last Year: Not on file  . Ran Out of Food in the Last Year: Not on file  Transportation Needs:   . Lack of Transportation (Medical): Not on file  . Lack of  Transportation (Non-Medical): Not on file  Physical Activity:   . Days of Exercise per Week: Not on file  . Minutes of Exercise per Session: Not on file  Stress:   . Feeling of Stress : Not on file  Social Connections:   . Frequency of Communication with Friends and Family: Not on file  . Frequency of Social Gatherings with Friends and Family: Not on file  . Attends Religious Services: Not on file  . Active Member of Clubs or Organizations: Not on file  . Attends Archivist Meetings: Not on file  . Marital Status: Not on file    Outpatient Encounter Medications as of 09/28/2019  Medication Sig  . CALCIUM-VITAMIN D PO Take 1 tablet by mouth 2 (two) times daily.   Marland Kitchen desonide (DESOWEN) 0.05 % cream Apply topically 2 (two) times daily.  Marland Kitchen escitalopram (LEXAPRO) 20 MG tablet Take 1 tablet (20 mg total) by mouth daily.  Marland Kitchen ezetimibe (ZETIA) 10 MG tablet Take 1 tablet (10 mg total) by mouth daily.  . fluticasone (FLONASE) 50 MCG/ACT nasal spray USE 2 SPRAYS IN EACH NOSTRIL DAILY  . levothyroxine (SYNTHROID) 100 MCG tablet Take 1 tablet (100 mcg total) by mouth daily.  . metFORMIN (GLUCOPHAGE) 1000 MG tablet Take 1 tablet (1,000 mg total) by mouth daily with breakfast.  . nadolol (CORGARD) 20 MG tablet TAKE 1 TABLET DAILY  . Omega-3-acid Ethyl Esters (OMACOR PO) Take 1 tablet by mouth 2 (two) times daily.   . rosuvastatin (CRESTOR) 10 MG tablet Take 1 tablet (10 mg total) by mouth daily.  . traZODone (DESYREL) 50 MG tablet Take 0.5 tablets (25 mg total) by mouth at bedtime as needed for sleep.   No facility-administered encounter medications on file as of 09/28/2019.    Activities of Daily Living In your present state of health, do you have any difficulty performing the following activities: 09/28/2019 09/28/2019  Hearing? Tempie Donning  Comment wears hearing aids has hearing aids  Vision? N N  Difficulty concentrating or making decisions? N N  Walking or climbing stairs? N N  Dressing or  bathing? N N  Doing errands, shopping? N N  Preparing Food and eating ? N -  Using the Toilet? N -  In the past six months, have you accidently leaked urine? N -  Do you have problems with loss of bowel control? N -  Managing your Medications? N -  Managing your Finances? N -  Housekeeping or managing your Housekeeping? N -  Some recent data might be hidden    Patient Care Team: Marin Olp, MD as PCP - General (Family Medicine)  Camillo Flaming, OD as Consulting Physician (Optometry) Rozetta Nunnery, MD as Consulting Physician (Otolaryngology)    Assessment:   This is a routine wellness examination for [place].  Exercise Activities and Dietary recommendations Current Exercise Habits: The patient does not participate in regular exercise at present  Goals    . Exercise 150 minutes per week (moderate activity)     Will continue with PT  Will develop a personal plan of exercise that meets your needs;  Older adults aged 46 or older, who are generally fit and have no health conditions that limit their mobility, should try to be active daily and should do: at least 150 minutes of moderate aerobic activity such as cycling or walking every week, and  strength exercises on two or more days a week that work all the major muscles (legs, hips, back, abdomen, chest, shoulders and arms).      OR  75 minutes of vigorous aerobic activity such as running or a game of singles tennis every week, and  strength exercises on two or more days a week that work all the major muscles (legs, hips, back, abdomen, chest, shoulders and arms).     OR  a mix of moderate and vigorous aerobic activity every week. For example, two 30-minute runs, plus 30 minutes of fast walking, equates to 150 minutes of moderate aerobic activity, and  strength exercises on two or more days a week that work all the major muscles (legs, hips, back, abdomen, chest, shoulders and arms).  A rule of thumb is that one  minute of vigorous activity provides the same health benefits as two minutes of moderate activity. You should also try to break up long periods of sitting with light activity, as sedentary behaviour is now considered an independent risk factor for ill health, no matter how much exercise you do. Find out why sitting is bad for your health. Older adults at risk of falls, such as people with weak legs, poor balance and some medical conditions, should do exercises to improve balance and co-ordination on at least two days a week. Examples include yoga, tai chi and dancing.      . maintain current health status       Fall Risk Fall Risk  09/28/2019 09/26/2018 09/03/2018 06/09/2018 04/23/2017  Falls in the past year? 0 0 1 1 Yes  Comment - - Slipped down the stairs Emmi Telephone Survey: data to providers prior to load -  Number falls in past yr: 0 - 0 1 1  Comment - - - Emmi Telephone Survey Actual Response = 1 -  Injury with Fall? 0 - 0 1 No  Follow up - - - - Education provided;Falls prevention discussed   Is the patient's home free of loose throw rugs in walkways, pet beds, electrical cords, etc?   yes      Grab bars in the bathroom? yes      Handrails on the stairs?   yes      Adequate lighting?   yes  Timed Get Up and Go performed: completed and within normal timeframe; no gait abnormalities noted   Depression Screen PHQ 2/9 Scores 09/28/2019 09/28/2019 09/08/2019 03/31/2019  PHQ - 2 Score 0 0 0 0  PHQ- 9 Score 1 1 1  0     Cognitive Function MMSE - Mini Mental State Exam 09/03/2018 05/08/2016  Not completed: - (No Data)  Orientation to time 5 -  Orientation to Place 5 -  Registration 3 -  Attention/ Calculation 5 -  Recall 3 -  Language- name 2 objects 2 -  Language- repeat 1 -  Language- follow 3 step command 3 -  Language- read & follow direction 1 -  Write a sentence 1 -  Copy design 1 -  Total score 30 -     6CIT Screen 09/28/2019  What Year? 0 points  What month? 0 points    What time? 0 points  Count back from 20 0 points  Months in reverse 0 points  Repeat phrase 0 points  Total Score 0    Immunization History  Administered Date(s) Administered  . Fluad Quad(high Dose 65+) 03/31/2019  . Influenza, High Dose Seasonal PF 06/28/2014, 04/06/2016, 05/13/2017, 09/03/2018  . PFIZER SARS-COV-2 Vaccination 09/12/2019  . Pneumococcal Conjugate-13 06/28/2014, 04/17/2016  . Pneumococcal Polysaccharide-23 08/22/2015  . Tdap 04/17/2016    Qualifies for Shingles Vaccine?Discussed and patient will check with pharmacy for coverage.  Patient education handout provided   Screening Tests Health Maintenance  Topic Date Due  . URINE MICROALBUMIN  12/09/2015  . OPHTHALMOLOGY EXAM  12/23/2019  . HEMOGLOBIN A1C  03/07/2020  . FOOT EXAM  09/07/2020  . TETANUS/TDAP  04/17/2026  . INFLUENZA VACCINE  Completed  . DEXA SCAN  Completed  . PNA vac Low Risk Adult  Completed    Cancer Screenings: Lung: Low Dose CT Chest recommended if Age 9-80 years, 30 pack-year currently smoking OR have quit w/in 15years. Patient does not qualify. Breast:  Up to date on Mammogram? Yes   Up to date of Bone Density/Dexa? No (patient will review patient education and notify office if she would like scheduled)  Colorectal: colonoscopy 03/20/12; no longer indicated     Plan:  I have personally reviewed and addressed the Medicare Annual Wellness questionnaire and have noted the following in the patient's chart:  A. Medical and social history B. Use of alcohol, tobacco or illicit drugs  C. Current medications and supplements D. Functional ability and status E.  Nutritional status F.  Physical activity G. Advance directives H. List of other physicians I.  Hospitalizations, surgeries, and ER visits in previous 12 months J.  [place] such as hearing and vision if needed, cognitive and depression L. Referrals, records requested, and appointments- none   In addition, I have  reviewed and discussed with patient certain preventive protocols, quality metrics, and best practice recommendations. A written personalized care plan for preventive services as well as general preventive health recommendations were provided to patient.   Signed,  Denman George, LPN  Nurse Health Advisor   Nurse Notes: Patient has completed 1st Covid vaccine

## 2019-09-30 ENCOUNTER — Other Ambulatory Visit: Payer: Self-pay

## 2019-09-30 ENCOUNTER — Encounter (INDEPENDENT_AMBULATORY_CARE_PROVIDER_SITE_OTHER): Payer: Self-pay | Admitting: Otolaryngology

## 2019-09-30 ENCOUNTER — Ambulatory Visit (INDEPENDENT_AMBULATORY_CARE_PROVIDER_SITE_OTHER): Payer: Medicare Other | Admitting: Otolaryngology

## 2019-09-30 VITALS — Temp 98.1°F

## 2019-09-30 DIAGNOSIS — R220 Localized swelling, mass and lump, head: Secondary | ICD-10-CM | POA: Diagnosis not present

## 2019-09-30 NOTE — Progress Notes (Signed)
HPI: Kim Singh is a 79 y.o. female who presents is referred by Dr. Yong Channel for evaluation of recurrent lip swelling.  Apparently the first time this occurred was several weeks ago and she was seen by Dr. Yong Channel and treated with steroids and the swelling gradually resolved.  She had another episode of swelling of her lips several weeks later and was seen by the PA who treated her with desonide 0.05% cream and the swelling gradually resolved.  She is referred here for further evaluation.  She is having no swelling today and no tenderness of the lips.  She is not sure what caused the lip swelling.  She was on a ACE inhibitor for control of her blood pressure which was subsequently stopped.  She did not describe any tongue swelling or airway problems when her lips were swollen. She also wanted her ears checked..  Past Medical History:  Diagnosis Date  . Breast cancer ([place]) 2001   Left  . Diabetes mellitus type 2, uncomplicated ([place])   . Personal history of chemotherapy   . Personal history of radiation therapy   . Pure hypercholesterolemia   . Unspecified disorder of thyroid   . Unspecified essential hypertension    Past Surgical History:  Procedure Laterality Date  . BREAST EXCISIONAL BIOPSY Left   . BREAST LUMPECTOMY Left 2001  . THYROIDECTOMY     total   Social History   Socioeconomic History  . Marital status: Widowed    Spouse name: Not on file  . Number of children: Not on file  . Years of education: Not on file  . Highest education level: Not on file  Occupational History  . Not on file  Tobacco Use  . Smoking status: Former Smoker    Packs/day: 1.00    Years: 41.00    Pack years: 41.00    Types: Cigarettes    Quit date: 08/02/1997    Years since quitting: 22.1  . Smokeless tobacco: Never Used  Substance and Sexual Activity  . Alcohol use: Yes    Alcohol/week: 7.0 standard drinks    Types: 7 Glasses of wine per week    Comment: glass of wine every day  . Drug use:  No  . Sexual activity: Not Currently  Other Topics Concern  . Not on file  Social History Narrative   Widowed 2008. 3 children. 4 grandkids. No greatgrandkids.    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       Hobbies: in [place] had a widowed group, here joined a church with kids, Psychologist, occupational work. Looking for friends network   Social Determinants of Health   Financial Resource Strain:   . Difficulty of Paying Living Expenses: Not on file  Food Insecurity:   . Worried About Charity fundraiser in the Last Year: Not on file  . Ran Out of Food in the Last Year: Not on file  Transportation Needs:   . Lack of Transportation (Medical): Not on file  . Lack of Transportation (Non-Medical): Not on file  Physical Activity:   . Days of Exercise per Week: Not on file  . Minutes of Exercise per Session: Not on file  Stress:   . Feeling of Stress : Not on file  Social Connections:   . Frequency of Communication with Friends and Family: Not on file  . Frequency  of Social Gatherings with Friends and Family: Not on file  . Attends Religious Services: Not on file  . Active Member of Clubs or Organizations: Not on file  . Attends Archivist Meetings: Not on file  . Marital Status: Not on file   Family History  Problem Relation Age of Onset  . Diabetes Mother   . Hearing loss Father   . Depression Son   . Cancer Sister        Bile Duct Cancer   Allergies  Allergen Reactions  . Codeine Other (See Comments)    GI upset  . Quinapril     Lip swelling/angioedema   Prior to Admission medications   Medication Sig Start Date End Date Taking? Authorizing Provider  CALCIUM-VITAMIN D PO Take 1 tablet by mouth 2 (two) times daily.    Yes [provider]  desonide (DESOWEN) 0.05 % cream Apply topically 2 (two) times daily. 09/23/19  Yes Inda Coke, PA  escitalopram (LEXAPRO) 20 MG tablet Take 1 tablet (20 mg total) by mouth daily. 03/31/19  Yes Marin Olp, MD  ezetimibe (ZETIA) 10 MG tablet Take 1 tablet (10 mg total) by mouth daily. 03/31/19  Yes Marin Olp, MD  fluticasone [facility]) 50 MCG/ACT nasal spray USE 2 SPRAYS IN [facility] NOSTRIL DAILY 02/06/19  Yes Marin Olp, MD  levothyroxine (SYNTHROID) 100 MCG tablet Take 1 tablet (100 mcg total) by mouth daily. 03/31/19  Yes Marin Olp, MD  metFORMIN (GLUCOPHAGE) 1000 MG tablet Take 1 tablet (1,000 mg total) by mouth daily with breakfast. 03/31/19  Yes Marin Olp, MD  nadolol (CORGARD) 20 MG tablet TAKE 1 TABLET DAILY 08/31/19  Yes Marin Olp, MD  Omega-3-acid Ethyl Esters (OMACOR PO) Take 1 tablet by mouth 2 (two) times daily.    Yes [provider]  predniSONE (DELTASONE) 20 MG tablet Take 1 pills for 6 days, 1/2 pill for 6 days, 1/2 pill every other day until finished. Do not take until visit with Dr. Lucia Gaskins- want his opinion 09/28/19  Yes Marin Olp, MD  rosuvastatin (CRESTOR) 10 MG tablet Take 1 tablet (10 mg total) by mouth daily. 03/31/19  Yes Marin Olp, MD  traZODone (DESYREL) 50 MG tablet Take 0.5 tablets (25 mg total) by mouth at bedtime as needed for sleep. 03/31/19  Yes Marin Olp, MD     Positive ROS: Otherwise negative  All other systems have been reviewed and were otherwise negative with the exception of those mentioned in the HPI and as above.  Physical Exam: Constitutional: Alert, well-appearing, no acute distress Ears: External ears without lesions or tenderness. Ear canals are clear bilaterally with minimal wax buildup which was nonobstructing.  TMs were clear bilaterally with good mobility on pneumatic otoscopy. Nasal: External nose without lesions.. Clear nasal passages bilaterally. Oral: Lips and gums without lesions. Tongue and palate mucosa without lesions. Posterior oropharynx clear.  Lips are normal to  evaluation in the office today.  There is no swelling and no abnormalities noted on the mucosal area of the lips. Neck: No palpable adenopathy or masses Respiratory: Breathing comfortably  Skin: No facial/neck lesions or rash noted.  Procedures  Assessment: The lip swelling most likely is a reactive process to something she was exposed to.  Presently normal lip evaluation with no signs of infection.  Plan: Reviewed with Kim Singh today concerning normal examination.  Etiology of the lip swelling is unclear but discussed with her concerning reviewing anything  she was exposed to 24 to 48 hours prior to any lip swelling that may occur in the future to try to identify etiology or cause of swelling.  I am not sure allergy evaluation would necessarily be beneficial unless she can narrow down possible causes herself by reviewing things she was exposed to prior to the lip swelling events.   Kim Journey, MD   CC:

## 2019-10-06 ENCOUNTER — Ambulatory Visit: Payer: Medicare Other | Attending: Internal Medicine

## 2019-10-06 DIAGNOSIS — Z23 Encounter for immunization: Secondary | ICD-10-CM

## 2019-10-06 NOTE — Progress Notes (Signed)
   Covid-19 Vaccination Clinic  Name:  Kim Singh    MRN: [id number] DOB: [birth year]  10/06/2019  [name] was observed post Covid-19 immunization for 15 minutes without incident. She was provided with Vaccine Information Sheet and instruction to access the V-Safe system.   [name] was instructed to call 911 with any severe reactions post vaccine: Marland Kitchen Difficulty breathing  . Swelling of face and throat  . A fast heartbeat  . A bad rash all over body  . Dizziness and weakness   Immunizations Administered    Name Date Dose VIS Date Route   Pfizer COVID-19 Vaccine 10/06/2019 10:35 AM 0.3 mL 07/03/2019 Intramuscular   Manufacturer: [place]   Lot: [id number]   [place]: [id number]

## 2019-10-07 ENCOUNTER — Other Ambulatory Visit: Payer: Self-pay

## 2019-10-07 ENCOUNTER — Telehealth: Payer: Self-pay | Admitting: Family Medicine

## 2019-10-07 MED ORDER — NADOLOL 20 MG PO TABS: 20.0000 mg | ORAL_TABLET | Freq: Every day | ORAL | 3 refills | Status: DC

## 2019-10-07 NOTE — Telephone Encounter (Signed)
MEDICATION:  nadolol (CORGARD) 20 MG tablet   PHARMACY:  [facility] [place], [address] [place] Phone:  [phone]  Fax:  [phone]       Comments:   **Let patient know to contact pharmacy at the end of the day to make sure medication is ready. **  ** Please notify patient to allow 48-72 hours to process**  **Encourage patient to contact the pharmacy for refills or they can request refills through [facility]**

## 2019-10-07 NOTE — Telephone Encounter (Signed)
Medication sent in to [facility] 

## 2019-10-17 ENCOUNTER — Other Ambulatory Visit: Payer: Self-pay | Admitting: Family Medicine

## 2019-11-13 ENCOUNTER — Other Ambulatory Visit: Payer: Self-pay

## 2019-11-16 ENCOUNTER — Other Ambulatory Visit: Payer: Self-pay

## 2019-11-16 ENCOUNTER — Encounter: Payer: Self-pay | Admitting: Family Medicine

## 2019-11-16 ENCOUNTER — Ambulatory Visit (INDEPENDENT_AMBULATORY_CARE_PROVIDER_SITE_OTHER): Payer: Medicare Other | Admitting: Family Medicine

## 2019-11-16 VITALS — BP 120/62 | HR 62 | Temp 98.7°F | Ht 68.0 in | Wt 184.0 lb

## 2019-11-16 DIAGNOSIS — E039 Hypothyroidism, unspecified: Secondary | ICD-10-CM | POA: Diagnosis not present

## 2019-11-16 DIAGNOSIS — E1142 Type 2 diabetes mellitus with diabetic polyneuropathy: Secondary | ICD-10-CM

## 2019-11-16 DIAGNOSIS — R202 Paresthesia of skin: Secondary | ICD-10-CM

## 2019-11-16 DIAGNOSIS — I10 Essential (primary) hypertension: Secondary | ICD-10-CM | POA: Diagnosis not present

## 2019-11-16 MED ORDER — METFORMIN HCL 1000 MG PO TABS: 1000.0000 mg | ORAL_TABLET | Freq: Every day | ORAL | 3 refills | Status: DC

## 2019-11-16 MED ORDER — QUINAPRIL HCL 5 MG PO TABS: 5.0000 mg | ORAL_TABLET | Freq: Every day | ORAL | 3 refills | Status: DC

## 2019-11-16 MED ORDER — LEVOTHYROXINE SODIUM 100 MCG PO TABS: 100.0000 ug | ORAL_TABLET | Freq: Every day | ORAL | 3 refills | Status: DC

## 2019-11-16 NOTE — Progress Notes (Signed)
Phone [phone] In person visit   Subjective:   Kim Singh is a 79 y.o. year old very pleasant female patient who presents for/with See problem oriented charting Chief Complaint  Patient presents with  . Medication Management    This visit occurred during the SARS-CoV-2 public health emergency.  Safety protocols were in place, including screening questions prior to the visit, additional usage of staff PPE, and extensive cleaning of exam room while observing appropriate contact time as indicated for disinfecting solutions.   Past Medical History-  Patient Active Problem List   Diagnosis Date Noted  . Angioedema due to angiotensin converting enzyme inhibitor (ACE-I) 09/08/2019    Priority: High  . Type II diabetes mellitus with neurological manifestations ([place]) 11/29/2013    Priority: High  . Diabetic neuropathy ([place]) 08/22/2015    Priority: Medium  . Depression 02/28/2015    Priority: Medium  . Dyslipidemia 11/29/2013    Priority: Medium  . Hypothyroidism 11/29/2013    Priority: Medium  . Essential hypertension 11/29/2013    Priority: Medium  . Former smoker 02/28/2015    Priority: Low  . History of breast cancer 11/30/2013    Priority: Low  . History of thyroid cancer 11/30/2013    Priority: Low  . Orthostatic hypotension 11/29/2013    Priority: Low  . Major depressive disorder with single episode, in full remission ([place]) 09/08/2019  . Insomnia 12/19/2018  . Grief reaction 05/16/2017    Medications- reviewed and updated Current Outpatient Medications  Medication Sig Dispense Refill  . CALCIUM-VITAMIN D PO Take 1 tablet by mouth 2 (two) times daily.     Marland Kitchen desonide (DESOWEN) 0.05 % cream Apply topically 2 (two) times daily. 30 g 0  . escitalopram (LEXAPRO) 20 MG tablet Take 1 tablet (20 mg total) by mouth daily. 90 tablet 3  . ezetimibe (ZETIA) 10 MG tablet Take 1 tablet (10 mg total) by mouth daily. 90 tablet 3  . fluticasone (FLONASE) 50 MCG/ACT nasal spray  USE 2 SPRAYS IN EACH NOSTRIL DAILY 16 g 11  . nadolol (CORGARD) 20 MG tablet Take 1 tablet (20 mg total) by mouth daily. 90 tablet 3  . nystatin ointment (MYCOSTATIN) APPLY EXTERNALLY THIN COAT TO CORNERS OF MOUTH AFTER EACH MEALS AND AT BEDTIME    . Omega-3-acid Ethyl Esters (OMACOR PO) Take 1 tablet by mouth 2 (two) times daily.     . rosuvastatin (CRESTOR) 10 MG tablet TAKE 1 TABLET DAILY 90 tablet 3  . traZODone (DESYREL) 50 MG tablet Take 0.5 tablets (25 mg total) by mouth at bedtime as needed for sleep. 45 tablet 3  . levothyroxine (SYNTHROID) 100 MCG tablet Take 1 tablet (100 mcg total) by mouth daily. 90 tablet 3  . metFORMIN (GLUCOPHAGE) 1000 MG tablet Take 1 tablet (1,000 mg total) by mouth daily with breakfast. 90 tablet 3  . quinapril (ACCUPRIL) 5 MG tablet Take 1 tablet (5 mg total) by mouth at bedtime. 90 tablet 3   No current facility-administered medications for this visit.     Objective:  BP 120/62   Pulse 62   Temp 98.7 F (37.1 C) (Temporal)   Ht 5\' 8"  (1.727 m)   Wt 184 lb (83.5 kg)   SpO2 94%   BMI 27.98 kg/m  Gen: NAD, resting comfortably No lip or tongue swelling on exam CV: RRR no murmurs rubs or gallops Lungs: CTAB no crackles, wheeze, rhonchi Abdomen: soft/nontender/nondistended/normal bowel sounds.  Ext: trace edema Skin: warm, dry Neuro: grossly normal, moves  all extremities, good strength in hands. Equal gross sensation     Assessment and Plan   #Hypertension S: controlled on nadolol 20 mg. She is off of quinapril as of September 02, 2019 due to suspected angioedema. She has been checking blood pressures at home off and on. Readings were between 117/70 and as hight as 152/98. She has not checked at home after end of March.   Patient was taken off Quinapril due to lip swelling. After she was taken off sent to ENT. Could not give reason for lip swelling. She was seen by dentist and discovered that it was a fungal infection. Was treated for that with  nystatin and all symptoms have resoled. She would like to be started back on it.   Patient denies any chest pain, shortness of breath, changes or changes in vision. Does add limited  salt to food. Tries to maintain a heart healthy diet. She does have some swelling in legs and headaches after changes in medications.  BP Readings from Last 3 Encounters:  11/16/19 120/62  09/28/19 130/80  09/28/19 130/80  A/P: Blood pressure is well controlled today but at home has had some elevations.  She would like to restart her Accupril 5 mg and this was ordered today.  She will let me know if her blood pressure gets low (less than 110/60)   #Hypothyroidism S: Compliant with levothyroxine 100 mcg. Has been out of thyroid medications. She never received from pharmacy. Has been out about 2 weeks ago.  A/P: suspect poor control - Restart levothyroxine 156mcg- can take 2 pills for 3 mornings when you restart since have been out for a bit.     # numbness of arms S:Left greater than right. Tingling feels like they are falling asleep. Has been going on for "more than a couple weeks" but -  She has been out of thyroid medications for about the same time.   History of diabetic neuropathy-typically already bothers her in hands. No chest pain or shortness of breath  A/P: Numbness and tingling issues could be multifactorial.  May be related to diabetic neuropathy.  Also could be related to being out of thyroid medication.  No chest pain or shortness of breath or exertional symptoms to suggest cardiac involvement.  If she has new or worsening symptoms she is going to let us know immediately.  For now we will restart thyroid medicine and see how she does  # Trouble swallowing pancakes or triscuits- no other issues and doesn't bother her with other foods. Advised GI referral- she declines for now.   Recommended follow up: Return for next already scheduled visit. Future Appointments  Date Time Provider [place]   01/28/2020  2:40 PM Marin Olp, MD [facility]   Lab/Order associations:   ICD-10-CM   1. Hypothyroidism, unspecified type  E03.9   2. Essential hypertension  I10   3. Diabetic polyneuropathy associated with type 2 diabetes mellitus (HCC)  E11.42   4. Paresthesias  R20.2     Meds ordered this encounter  Medications  . levothyroxine (SYNTHROID) 100 MCG tablet    Sig: Take 1 tablet (100 mcg total) by mouth daily.    Dispense:  90 tablet    Refill:  3  . metFORMIN (GLUCOPHAGE) 1000 MG tablet    Sig: Take 1 tablet (1,000 mg total) by mouth daily with breakfast.    Dispense:  90 tablet    Refill:  3  . quinapril (ACCUPRIL) 5 MG tablet  Sig: Take 1 tablet (5 mg total) by mouth at bedtime.    Dispense:  90 tablet    Refill:  3   Return precautions advised.  Garret Reddish, MD

## 2019-11-16 NOTE — Patient Instructions (Addendum)
Please call [phone] to schedule a visit with [facility] -Trey Paula is an excellent counselor we want to reconnect you with -continue lexapro  Restart accupril 5 mg. Let me know immediately if have lip/tongue swelling or seek care   Let me know if numbness issues in arms fails to improve back on levothyroxine. If you get chest pain or shortness of breath let us know immediately. Could also be from diabetic neuropathy  Restart levothyroxine 152mcg- can take 2 pills for 3 mornings when you restart since have been out for a bit.   You agreed to referral to gastroenterology if your trouble swallowing worsens. I offered referral now but you declined.   Recommended follow up: Return for next already scheduled visit.

## 2019-11-18 ENCOUNTER — Encounter: Payer: Self-pay | Admitting: Family Medicine

## 2019-11-18 NOTE — Telephone Encounter (Signed)
Please advise 

## 2019-11-26 ENCOUNTER — Encounter: Payer: Self-pay | Admitting: Family Medicine

## 2019-11-26 ENCOUNTER — Other Ambulatory Visit: Payer: Self-pay

## 2019-11-26 ENCOUNTER — Telehealth (INDEPENDENT_AMBULATORY_CARE_PROVIDER_SITE_OTHER): Payer: Medicare Other | Admitting: Family Medicine

## 2019-11-26 VITALS — Ht 68.0 in | Wt 185.0 lb

## 2019-11-26 DIAGNOSIS — J029 Acute pharyngitis, unspecified: Secondary | ICD-10-CM

## 2019-11-26 DIAGNOSIS — R0981 Nasal congestion: Secondary | ICD-10-CM

## 2019-11-26 MED ORDER — AMOXICILLIN 500 MG PO CAPS
500.0000 mg | ORAL_CAPSULE | Freq: Two times a day (BID) | ORAL | 0 refills | Status: AC
Start: 2019-11-26 — End: 2019-12-06

## 2019-11-26 NOTE — Progress Notes (Signed)
Phone [phone] Virtual visit via Video note   Subjective:  Chief complaint: Chief Complaint  Patient presents with  . virtual  . Sore Throat    loss of voice   This visit type was conducted due to national recommendations for restrictions regarding the COVID-19 Pandemic (e.g. social distancing).  This format is felt to be most appropriate for this patient at this time balancing risks to patient and risks to population by having him in for in person visit.  No physical exam was performed (except for noted visual exam or audio findings with Telehealth visits).    Our team/I connected with Kim Singh at 10:00 AM EDT by a video enabled telemedicine application (doxy.me or caregility through epic) and verified that I am speaking with the correct person using two identifiers.  Location patient: [place] Location provider: [facility], office Persons participating in the virtual visit:  patient  Our team/I discussed the limitations of evaluation and management by telemedicine and the availability of in person appointments. In light of current covid-19 pandemic, patient also understands that we are trying to protect them by minimizing in office contact if at all possible.  The patient expressed consent for telemedicine visit and agreed to proceed. Patient understands insurance will be billed.   Past Medical History-  Patient Active Problem List   Diagnosis Date Noted  . Angioedema due to angiotensin converting enzyme inhibitor (ACE-I) 09/08/2019    Priority: High  . Type II diabetes mellitus with neurological manifestations ([place]) 11/29/2013    Priority: High  . Diabetic neuropathy ([place]) 08/22/2015    Priority: Medium  . Depression 02/28/2015    Priority: Medium  . Dyslipidemia 11/29/2013    Priority: Medium  . Hypothyroidism 11/29/2013    Priority: Medium  . Essential hypertension 11/29/2013    Priority: Medium  . Former smoker 02/28/2015    Priority: Low  . History of breast  cancer 11/30/2013    Priority: Low  . History of thyroid cancer 11/30/2013    Priority: Low  . Orthostatic hypotension 11/29/2013    Priority: Low  . Major depressive disorder with single episode, in full remission ([place]) 09/08/2019  . Insomnia 12/19/2018  . Grief reaction 05/16/2017    Medications- reviewed and updated Current Outpatient Medications  Medication Sig Dispense Refill  . CALCIUM-VITAMIN D PO Take 1 tablet by mouth 2 (two) times daily.     Marland Kitchen desonide (DESOWEN) 0.05 % cream Apply topically 2 (two) times daily. 30 g 0  . escitalopram (LEXAPRO) 20 MG tablet Take 1 tablet (20 mg total) by mouth daily. 90 tablet 3  . ezetimibe (ZETIA) 10 MG tablet Take 1 tablet (10 mg total) by mouth daily. 90 tablet 3  . fluticasone (FLONASE) 50 MCG/ACT nasal spray USE 2 SPRAYS IN EACH NOSTRIL DAILY 16 g 11  . levothyroxine (SYNTHROID) 100 MCG tablet Take 1 tablet (100 mcg total) by mouth daily. 90 tablet 3  . metFORMIN (GLUCOPHAGE) 1000 MG tablet Take 1 tablet (1,000 mg total) by mouth daily with breakfast. 90 tablet 3  . nadolol (CORGARD) 20 MG tablet Take 1 tablet (20 mg total) by mouth daily. 90 tablet 3  . nystatin ointment (MYCOSTATIN) APPLY EXTERNALLY THIN COAT TO CORNERS OF MOUTH AFTER EACH MEALS AND AT BEDTIME    . Omega-3-acid Ethyl Esters (OMACOR PO) Take 1 tablet by mouth 2 (two) times daily.     . quinapril (ACCUPRIL) 5 MG tablet Take 1 tablet (5 mg total) by mouth at bedtime. 90 tablet  3  . rosuvastatin (CRESTOR) 10 MG tablet TAKE 1 TABLET DAILY 90 tablet 3  . traZODone (DESYREL) 50 MG tablet Take 0.5 tablets (25 mg total) by mouth at bedtime as needed for sleep. 45 tablet 3   No current facility-administered medications for this visit.     Objective:  Ht 5\' 8"  (1.727 m)   Wt 185 lb (83.9 kg)   BMI 28.13 kg/m  self reported vitals Gen: NAD, resting comfortably Lungs: nonlabored, normal respiratory rate  Skin: appears dry, no obvious rash     Assessment and Plan   Sore  Throat/ Loss of Voice S: pt c/o sore throat that started early yesterday which has gotten worse. Last night Kim Singh took ibuprofen and woke up this morning and did not have a voice and Kim Singh would like something for it. Pt denies fever, Kim Singh has a lot of nasal drainage and runny nose. Kim Singh was at [facility] over the weekend at a volleyball game.  No fever or swollen lymph nodes or chills.   Kim Singh has been vaccinated from covid 19. Has had some nasal congestion and runny nose for several weeks. Likely has underlying allergies- not taking anything for allergies.  A/P: 79 year old female with worsening sore throat over the last day or 2.  Has had runny nose and some sinus congestion for several weeks that Kim Singh thinks is related to allergies.  Does not look like we can get patient tested for Covid until at least tomorrow based on availability for [facility] and in [place] would be Monday.  We cannot test for strep throat in office until we have a negative Covid test.  I told patient I thought her symptoms most likely related to allergies or a viral pharyngitis-Kim Singh is concerned about potential strep throat and wants to empirically treat-given complexity of situation I agreed to prescribe this-amoxicillin was sent in.  If Kim Singh has new or worsening symptoms Kim Singh should let us know-Covid testing as below -Also considered prednisone as an anti-inflammatory but Kim Singh does have diabetes and Kim Singh strongly prefers the amoxicillin choice -Does not seem like an obvious sinusitis and we did not choose to use Augmentin for that reason-I did discuss risks of antibiotics with patient but Kim Singh would still like to proceed   Patient with symptoms concerning for potential covid 19 but Kim Singh is fully vaccinated.  Therefore: -information provided on testing scheduling "please text "COVID" to [phone], OR you can log on to [email or web address] to easily make an on-line appointment. "   - recommended patient watch closely for shortness of breath or  confusion or worsening symptoms and if those occur he should contact us immediately  -recommended self isolation until negative test  at minimum .   Recommended follow up: As needed for new or worsening symptoms-we already scheduled for July follow-up Future Appointments  Date Time Provider [place]  01/28/2020  2:40 PM Marin Olp, MD [facility]   Lab/Order associations:   ICD-10-CM   1. Sore throat  J02.9   2. Nasal congestion  R09.81    Meds ordered this encounter  Medications  . amoxicillin (AMOXIL) 500 MG capsule    Sig: Take 1 capsule (500 mg total) by mouth 2 (two) times daily for 10 days.    Dispense:  20 capsule    Refill:  0   Return precautions advised.  Garret Reddish, MD

## 2019-11-30 ENCOUNTER — Other Ambulatory Visit: Payer: Medicare Other

## 2020-01-26 ENCOUNTER — Other Ambulatory Visit: Payer: Self-pay | Admitting: Family Medicine

## 2020-01-28 ENCOUNTER — Ambulatory Visit (INDEPENDENT_AMBULATORY_CARE_PROVIDER_SITE_OTHER): Payer: Medicare Other | Admitting: Family Medicine

## 2020-01-28 ENCOUNTER — Encounter: Payer: Self-pay | Admitting: Family Medicine

## 2020-01-28 ENCOUNTER — Other Ambulatory Visit: Payer: Self-pay

## 2020-01-28 VITALS — BP 118/60 | HR 68 | Temp 98.4°F | Ht 68.0 in | Wt 181.0 lb

## 2020-01-28 DIAGNOSIS — Z79899 Other long term (current) drug therapy: Secondary | ICD-10-CM | POA: Diagnosis not present

## 2020-01-28 DIAGNOSIS — F325 Major depressive disorder, single episode, in full remission: Secondary | ICD-10-CM

## 2020-01-28 DIAGNOSIS — E039 Hypothyroidism, unspecified: Secondary | ICD-10-CM

## 2020-01-28 DIAGNOSIS — T464X5A Adverse effect of angiotensin-converting-enzyme inhibitors, initial encounter: Secondary | ICD-10-CM

## 2020-01-28 DIAGNOSIS — I10 Essential (primary) hypertension: Secondary | ICD-10-CM | POA: Diagnosis not present

## 2020-01-28 DIAGNOSIS — E785 Hyperlipidemia, unspecified: Secondary | ICD-10-CM

## 2020-01-28 DIAGNOSIS — E1149 Type 2 diabetes mellitus with other diabetic neurological complication: Secondary | ICD-10-CM | POA: Diagnosis not present

## 2020-01-28 DIAGNOSIS — T783XXA Angioneurotic edema, initial encounter: Secondary | ICD-10-CM | POA: Diagnosis not present

## 2020-01-28 MED ORDER — TRAZODONE HCL 50 MG PO TABS: 25.0000 mg | ORAL_TABLET | Freq: Every evening | ORAL | 3 refills | Status: DC | PRN

## 2020-01-28 MED ORDER — ESCITALOPRAM OXALATE 20 MG PO TABS: 20.0000 mg | ORAL_TABLET | Freq: Every day | ORAL | 3 refills | Status: DC

## 2020-01-28 NOTE — Progress Notes (Signed)
Phone [phone] In person visit   Subjective:   Kim Singh is a 79 y.o. year old very pleasant female patient who presents for/with See problem oriented charting Chief Complaint  Patient presents with  . Hypertension  . Hypothyroidism    This visit occurred during the SARS-CoV-2 public health emergency.  Safety protocols were in place, including screening questions prior to the visit, additional usage of staff PPE, and extensive cleaning of exam room while observing appropriate contact time as indicated for disinfecting solutions.   Past Medical History-  Patient Active Problem List   Diagnosis Date Noted  . Type II diabetes mellitus with neurological manifestations ([place]) 11/29/2013    Priority: High  . Diabetic neuropathy ([place]) 08/22/2015    Priority: Medium  . Depression 02/28/2015    Priority: Medium  . Dyslipidemia 11/29/2013    Priority: Medium  . Hypothyroidism 11/29/2013    Priority: Medium  . Essential hypertension 11/29/2013    Priority: Medium  . Former smoker 02/28/2015    Priority: Low  . History of breast cancer 11/30/2013    Priority: Low  . History of thyroid cancer 11/30/2013    Priority: Low  . Orthostatic hypotension 11/29/2013    Priority: Low  . Major depressive disorder with single episode, in full remission ([place]) 09/08/2019  . Insomnia 12/19/2018  . Grief reaction 05/16/2017    Medications- reviewed and updated Current Outpatient Medications  Medication Sig Dispense Refill  . CALCIUM-VITAMIN D PO Take 1 tablet by mouth 2 (two) times daily.     Marland Kitchen desonide (DESOWEN) 0.05 % cream Apply topically 2 (two) times daily. 30 g 0  . escitalopram (LEXAPRO) 20 MG tablet Take 1 tablet (20 mg total) by mouth daily. 90 tablet 3  . ezetimibe (ZETIA) 10 MG tablet TAKE 1 TABLET DAILY 90 tablet 3  . fluticasone (FLONASE) 50 MCG/ACT nasal spray USE 2 SPRAYS IN EACH NOSTRIL DAILY 16 g 11  . levothyroxine (SYNTHROID) 100 MCG tablet Take 1 tablet (100 mcg  total) by mouth daily. 90 tablet 3  . metFORMIN (GLUCOPHAGE) 1000 MG tablet Take 1 tablet (1,000 mg total) by mouth daily with breakfast. 90 tablet 3  . nadolol (CORGARD) 20 MG tablet Take 1 tablet (20 mg total) by mouth daily. 90 tablet 3  . nystatin ointment (MYCOSTATIN) APPLY EXTERNALLY THIN COAT TO CORNERS OF MOUTH AFTER EACH MEALS AND AT BEDTIME    . Omega-3-acid Ethyl Esters (OMACOR PO) Take 1 tablet by mouth 2 (two) times daily.     . quinapril (ACCUPRIL) 5 MG tablet Take 1 tablet (5 mg total) by mouth at bedtime. 90 tablet 3  . rosuvastatin (CRESTOR) 10 MG tablet TAKE 1 TABLET DAILY 90 tablet 3  . traZODone (DESYREL) 50 MG tablet Take 0.5 tablets (25 mg total) by mouth at bedtime as needed for sleep. 45 tablet 3   No current facility-administered medications for this visit.     Objective:  BP 118/60   Pulse 68   Temp 98.4 F (36.9 C) (Temporal)   Ht 5\' 8"  (1.727 m)   Wt 181 lb (82.1 kg)   SpO2 94%   BMI 27.52 kg/m  Gen: NAD, resting comfortably CV: RRR no murmurs rubs or gallops Lungs: CTAB no crackles, wheeze, rhonchi Abdomen: soft/nontender/nondistended/normal bowel sounds.  Ext: trace edema Skin: warm, dry    Assessment and Plan   #Diabetes with neuropathy.  S:  on Metformin 1000 mg daily . Not on medicine for neuropathy- doesn't want to take anything  Lab Results  Component Value Date   HGBA1C 6.2 (A) 09/08/2019  A/P: hopefully controlled- update a1c with labs today. On metformin we will check b12    #Hypertension S: controlled on nadolol 20 mg and quinapril 5mg  BP Readings from Last 3 Encounters:  01/28/20 118/60  11/16/19 120/62  09/28/19 130/80  A/P: Stable. Continue current medications.     Depression  s: Compliant with Lexapro 20 mg.  Loss of son to suicide in October 2018 contributes. Depression screen [facility] 2/9 11/26/2019 09/28/2019 09/28/2019  Decreased Interest 0 0 0  Down, Depressed, Hopeless 0 0 0  PHQ - 2 Score 0 0 0  Altered sleeping 0 1 1  Tired,  decreased energy 0 0 0  Change in appetite 0 0 0  Feeling bad or failure about yourself  0 0 0  Trouble concentrating 0 0 0  Moving slowly or fidgety/restless 0 0 0  Suicidal thoughts 0 0 0  PHQ-9 Score 0 1 1  Difficult doing work/chores Not difficult at all Not difficult at all Not difficult at all  Some recent data might be hidden  A/P:  Still with some ups and downs but overall doing well- continue current meds. Appears to be in full remission.    #Hyperlipidemia S: Compliant with Zetia 10 mg and Crestor 10 mg daily. Lab Results  Component Value Date   CHOL 112 09/28/2019   HDL 40.80 09/28/2019   LDLCALC 36 09/26/2018   LDLDIRECT 40.0 09/28/2019   TRIG 342.0 (H) 09/28/2019   CHOLHDL 3 09/28/2019  A/P: Excellent control-continue current medications   #Hypothyroidism S: Compliant with levothyroxine 100 mcg- was out of medicine about 3 months ago for a time Lab Results  Component Value Date   TSH 0.38 09/28/2019  A/P: Hopefully stable-update thyroid level with lab work today  Recommended follow up: 4 months follow up  Lab/Order associations:   ICD-10-CM   1. Hypothyroidism, unspecified type  E03.9 TSH  2. Essential hypertension  I10   3. Type II diabetes mellitus with neurological manifestations (HCC)  E11.49 Hemoglobin P1W    Basic metabolic panel  4. High risk medication use  Z79.899 Vitamin B12  5. Angioedema due to angiotensin converting enzyme inhibitor (ACE-I)  T78.3XXA    T46.4X5A   6. Dyslipidemia  E78.5   7. Major depressive disorder with single episode, in full remission ([place])  F32.5     Return precautions advised.  Garret Reddish, MD

## 2020-01-28 NOTE — Assessment & Plan Note (Signed)
This ended up being a fungal infection treated by dentist and resolved- will delete issue

## 2020-01-28 NOTE — Patient Instructions (Addendum)
Diabetes: we are checking lab work today we will call and review results with you.   Blood pressure: looks good today. Continue medications. Let our office know if any change.   Thyroid: continue your medications we are going to check to make sure levels are good.   Cholesterol: continue current medications. Levels were good in march so we are not going to recheck today.    Have an amazing time in [place]. Tell your grandson good luck at the volleyball tournament.   If you have any questions or concerns before your next scheduled appointment please give our office a call.

## 2020-01-29 LAB — BASIC METABOLIC PANEL
BUN: 19 mg/dL (ref 6–23)
CO2: 33 mEq/L — ABNORMAL HIGH (ref 19–32)
Calcium: 9.7 mg/dL (ref 8.4–10.5)
Chloride: 101 mEq/L (ref 96–112)
Creatinine, Ser: 1 mg/dL (ref 0.40–1.20)
GFR: 53.18 mL/min — ABNORMAL LOW (ref >60.00)
Glucose, Bld: 115 mg/dL — ABNORMAL HIGH (ref 70–99)
Potassium: 3.8 mEq/L (ref 3.5–5.1)
Sodium: 140 mEq/L (ref 135–145)

## 2020-01-29 LAB — HEMOGLOBIN A1C: Hgb A1c MFr Bld: 6.8 % — ABNORMAL HIGH (ref 4.6–6.5)

## 2020-01-29 LAB — VITAMIN B12: Vitamin B-12: 368 pg/mL (ref 211–911)

## 2020-01-29 LAB — TSH: TSH: 0.29 u[IU]/mL — ABNORMAL LOW (ref 0.35–4.50)

## 2020-02-01 ENCOUNTER — Other Ambulatory Visit: Payer: Self-pay

## 2020-02-01 DIAGNOSIS — E039 Hypothyroidism, unspecified: Secondary | ICD-10-CM

## 2020-02-01 MED ORDER — LEVOTHYROXINE SODIUM 88 MCG PO TABS
88.0000 ug | ORAL_TABLET | Freq: Every day | ORAL | 3 refills | Status: DC
Start: 2020-02-01 — End: 2021-02-16

## 2020-03-22 ENCOUNTER — Encounter: Payer: Self-pay | Admitting: Family Medicine

## 2020-03-23 ENCOUNTER — Other Ambulatory Visit: Payer: Self-pay

## 2020-03-23 ENCOUNTER — Telehealth: Payer: Self-pay | Admitting: Family Medicine

## 2020-03-23 NOTE — Telephone Encounter (Signed)
..   LAST APPOINTMENT DATE: 01/28/2020   NEXT APPOINTMENT DATE:@11 /03/2020  MEDICATION:quinapril (ACCUPRIL) 5 MG tablet  PHARMACY:[id number] - [place], [place] - [address] AT [place] [address]  **Let patient know to contact pharmacy at the end of the day to make sure medication is ready. **  ** Please notify patient to allow 48-72 hours to process**  **Encourage patient to contact the pharmacy for refills or they can request refills through [facility]**  CLINICAL FILLS OUT ALL BELOW:   LAST REFILL:  QTY:  REFILL DATE:    OTHER COMMENTS:    Okay for refill?  Please advise

## 2020-03-24 ENCOUNTER — Other Ambulatory Visit: Payer: Self-pay

## 2020-03-24 MED ORDER — QUINAPRIL HCL 5 MG PO TABS: 5.0000 mg | ORAL_TABLET | Freq: Every day | ORAL | 0 refills | Status: DC

## 2020-03-24 NOTE — Telephone Encounter (Signed)
Medication sent to [facility]. 

## 2020-04-21 ENCOUNTER — Encounter: Payer: Self-pay | Admitting: Family Medicine

## 2020-05-02 DIAGNOSIS — Z961 Presence of intraocular lens: Secondary | ICD-10-CM | POA: Diagnosis not present

## 2020-05-02 DIAGNOSIS — E119 Type 2 diabetes mellitus without complications: Secondary | ICD-10-CM | POA: Diagnosis not present

## 2020-05-02 LAB — HM DIABETES EYE EXAM

## 2020-05-10 ENCOUNTER — Other Ambulatory Visit: Payer: Self-pay | Admitting: Family Medicine

## 2020-05-11 ENCOUNTER — Other Ambulatory Visit (HOSPITAL_COMMUNITY): Payer: Self-pay | Admitting: Internal Medicine

## 2020-05-11 ENCOUNTER — Ambulatory Visit: Payer: Medicare Other | Attending: Internal Medicine

## 2020-05-11 DIAGNOSIS — Z23 Encounter for immunization: Secondary | ICD-10-CM

## 2020-05-11 NOTE — Progress Notes (Signed)
   Covid-19 Vaccination Clinic  Name:  Markala Sitts    MRN: [id number] DOB: [birth year]  05/11/2020  Ms. Haselton was observed post Covid-19 immunization for 15 minutes without incident. She was provided with Vaccine Information Sheet and instruction to access the V-Safe system.   Ms. Hegwood was instructed to call 911 with any severe reactions post vaccine: Marland Kitchen Difficulty breathing  . Swelling of face and throat  . A fast heartbeat  . A bad rash all over body  . Dizziness and weakness

## 2020-05-31 ENCOUNTER — Ambulatory Visit: Payer: Medicare Other | Admitting: Family Medicine

## 2020-07-19 ENCOUNTER — Other Ambulatory Visit: Payer: Self-pay

## 2020-07-19 ENCOUNTER — Ambulatory Visit (INDEPENDENT_AMBULATORY_CARE_PROVIDER_SITE_OTHER): Payer: Medicare Other | Admitting: Physician Assistant

## 2020-07-19 ENCOUNTER — Encounter: Payer: Self-pay | Admitting: Physician Assistant

## 2020-07-19 VITALS — BP 110/70 | HR 68 | Temp 97.4°F | Ht 68.0 in | Wt 174.5 lb

## 2020-07-19 DIAGNOSIS — R300 Dysuria: Secondary | ICD-10-CM | POA: Diagnosis not present

## 2020-07-19 LAB — POCT URINALYSIS DIPSTICK
Glucose, UA: NEGATIVE
Ketones, UA: 5
Nitrite, UA: POSITIVE
Protein, UA: POSITIVE — AB
Spec Grav, UA: >=1.03 — AB (ref 1.010–1.025)
Urobilinogen, UA: 1 E.U./dL
pH, UA: 6 (ref 5.0–8.0)

## 2020-07-19 MED ORDER — CEPHALEXIN 500 MG PO CAPS: 500.0000 mg | ORAL_CAPSULE | Freq: Two times a day (BID) | ORAL | 0 refills | Status: DC

## 2020-07-19 NOTE — Patient Instructions (Signed)
It was great to see you!  Start keflex antibiotic. I will be in touch with your urine culture results.  General instructions  Make sure you: ? Pee until your bladder is empty. ? Do not hold pee for a long time. ? Empty your bladder after sex. ? Wipe from front to back after pooping if you are a female. Use each tissue one time when you wipe.  Drink enough fluid to keep your pee pale yellow.  Keep all follow-up visits as told by your doctor. This is important. Contact a doctor if:  You do not get better after 1-2 days.  Your symptoms go away and then come back. Get help right away if:  You have very bad back pain.  You have very bad pain in your lower belly.  You have a fever.  You are sick to your stomach (nauseous).  You are throwing up.   Take care,  Jarold Motto PA-C

## 2020-07-19 NOTE — Progress Notes (Signed)
Kim Singh is a 79 y.o. female here for a new problem.  I acted as a Kim Singh for [employer], PA-C Kim Mull, Kim Singh   History of Present Illness:   Chief Complaint  Patient presents with  . Dysuria    HPI  Dysuria Pt c/o burning with urination x 3 days. Denies back pain, nausea, vomiting, malaise, fever or chills. Pt has not taken anything OTC for her symptoms. She has good appetite. Hx of UTI and symptoms are consistent with this.  Past Medical History:  Diagnosis Date  . Breast cancer (HCC) 2001   Left  . Diabetes mellitus type 2, uncomplicated (HCC)   . Personal history of chemotherapy   . Personal history of radiation therapy   . Pure hypercholesterolemia   . Unspecified disorder of thyroid   . Unspecified essential hypertension      Social History   Tobacco Use  . Smoking status: Former Smoker    Packs/day: 1.00    Years: 41.00    Pack years: 41.00    Types: Cigarettes    Quit date: 08/02/1997    Years since quitting: 22.9  . Smokeless tobacco: Never Used  Vaping Use  . Vaping Use: Never used  Substance Use Topics  . Alcohol use: Yes    Alcohol/week: 7.0 standard drinks    Types: 7 Glasses of wine per week    Comment: glass of wine every day  . Drug use: No    Past Surgical History:  Procedure Laterality Date  . BREAST EXCISIONAL BIOPSY Left   . BREAST LUMPECTOMY Left 2001  . THYROIDECTOMY     total    Family History  Problem Relation Age of Onset  . Diabetes Mother   . Hearing loss Father   . Depression Son   . Cancer Sister        Bile Duct Cancer    Allergies  Allergen Reactions  . Codeine Other (See Comments)    GI upset    Current Medications:   Current Outpatient Medications:  .  CALCIUM-VITAMIN D PO, Take 1 tablet by mouth 2 (two) times daily. , Disp: , Rfl:  .  cephALEXin (KEFLEX) 500 MG capsule, Take 1 capsule (500 mg total) by mouth 2 (two) times daily., Disp: 14 capsule, Rfl: 0 .  desonide (DESOWEN) 0.05 % cream,  Apply topically 2 (two) times daily., Disp: 30 g, Rfl: 0 .  escitalopram (LEXAPRO) 20 MG tablet, Take 1 tablet (20 mg total) by mouth daily., Disp: 90 tablet, Rfl: 3 .  ezetimibe (ZETIA) 10 MG tablet, TAKE 1 TABLET DAILY, Disp: 90 tablet, Rfl: 3 .  fluticasone (FLONASE) 50 MCG/ACT nasal spray, USE 2 SPRAYS IN EACH NOSTRIL DAILY, Disp: 16 g, Rfl: 11 .  levothyroxine (SYNTHROID) 88 MCG tablet, Take 1 tablet (88 mcg total) by mouth daily., Disp: 90 tablet, Rfl: 3 .  metFORMIN (GLUCOPHAGE) 1000 MG tablet, Take 1 tablet (1,000 mg total) by mouth daily with breakfast., Disp: 90 tablet, Rfl: 3 .  nadolol (CORGARD) 20 MG tablet, Take 1 tablet (20 mg total) by mouth daily., Disp: 90 tablet, Rfl: 3 .  nystatin ointment (MYCOSTATIN), APPLY EXTERNALLY THIN COAT TO CORNERS OF MOUTH AFTER EACH MEALS AND AT BEDTIME, Disp: , Rfl:  .  Omega-3-acid Ethyl Esters (OMACOR PO), Take 1 tablet by mouth 2 (two) times daily. , Disp: , Rfl:  .  quinapril (ACCUPRIL) 5 MG tablet, Take 1 tablet (5 mg total) by mouth at bedtime., Disp: 10 tablet, Rfl:  0 .  rosuvastatin (CRESTOR) 10 MG tablet, TAKE 1 TABLET DAILY, Disp: 90 tablet, Rfl: 3 .  traZODone (DESYREL) 50 MG tablet, Take 0.5 tablets (25 mg total) by mouth at bedtime as needed for sleep., Disp: 45 tablet, Rfl: 3   Review of Systems:   ROS Negative unless otherwise specified per HPI.  Vitals:   Vitals:   07/19/20 1513  BP: 110/70  Pulse: 68  Temp: (!) 97.4 F (36.3 C)  TempSrc: Temporal  SpO2: 94%  Weight: 174 lb 8 oz (79.2 kg)  Height: 5\' 8"  (1.727 m)     Body mass index is 26.53 kg/m.  Physical Exam:   Physical Exam Vitals and nursing note reviewed.  Constitutional:      General: She is not in acute distress.    Appearance: Normal appearance. She is well-developed. She is not ill-appearing, toxic-appearing or sickly-appearing.  Cardiovascular:     Rate and Rhythm: Normal rate and regular rhythm.     Pulses: Normal pulses.     Heart sounds: Normal  heart sounds, S1 normal and S2 normal.  Pulmonary:     Effort: Pulmonary effort is normal.     Breath sounds: Normal breath sounds.  Abdominal:     General: Bowel sounds are normal.     Tenderness: There is no abdominal tenderness. There is no CVA tenderness.  Skin:    General: Skin is warm, dry and intact.  Neurological:     Mental Status: She is alert.     GCS: GCS eye subscore is 4. GCS verbal subscore is 5. GCS motor subscore is 6.  Psychiatric:        Mood and Affect: Mood and affect normal.        Speech: Speech normal.        Behavior: Behavior normal. Behavior is cooperative.     Results for orders placed or performed in visit on 07/19/20  POCT urinalysis dipstick  Result Value Ref Range   Color, UA yellow    Clarity, UA Hazy    Glucose, UA Negative Negative   Bilirubin, UA 1+    Ketones, UA 5    Spec Grav, UA >=1.030 (A) 1.010 - 1.025   Blood, UA 3+    pH, UA 6.0 5.0 - 8.0   Protein, UA Positive (A) Negative   Urobilinogen, UA 1.0 0.2 or 1.0 E.U./dL   Nitrite, UA Positive    Leukocytes, UA Moderate (2+) (A) Negative   Appearance     Odor      Assessment and Plan:   Kim Singh was seen today for dysuria.  Diagnoses and all orders for this visit:   Dysuria UA and symptoms consistent with acute cystitis. Start keflex 500 mg BID. Worsening precautions advised. -     POCT urinalysis dipstick -     Urine Culture   Other orders -     cephALEXin (KEFLEX) 500 MG capsule; Take 1 capsule (500 mg total) by mouth 2 (two) times daily.   CMA or Kim Singh served as scribe during this visit. History, Physical, and Plan performed by medical provider. The above documentation has been reviewed and is accurate and complete.   Kim March, PA-C

## 2020-07-21 LAB — URINE CULTURE
MICRO NUMBER:: 11362069
SPECIMEN QUALITY:: ADEQUATE

## 2020-08-01 NOTE — Patient Instructions (Signed)
Please stop by lab before you go If you have mychart- we will send your results within 3 business days of [place] receiving them.  If you do not have mychart- we will call you about results within 5 business days of [place] receiving them.  *please also note that you will see labs on mychart as soon as they post. I will later go in and write notes on them- will say "notes from Dr. Yong Channel"  Health Maintenance Due  Topic Date Due  . INFLUENZA VACCINE  02/21/2020   Depression screen [facility] 2/9 11/26/2019 09/28/2019 09/28/2019  Decreased Interest 0 0 0  Down, Depressed, Hopeless 0 0 0  PHQ - 2 Score 0 0 0  Altered sleeping 0 1 1  Tired, decreased energy 0 0 0  Change in appetite 0 0 0  Feeling bad or failure about yourself  0 0 0  Trouble concentrating 0 0 0  Moving slowly or fidgety/restless 0 0 0  Suicidal thoughts 0 0 0  PHQ-9 Score 0 1 1  Difficult doing work/chores Not difficult at all Not difficult at all Not difficult at all  Some recent data might be hidden

## 2020-08-01 NOTE — Progress Notes (Signed)
Phone [phone] Virtual visit via Video note   Subjective:  Chief complaint: Chief Complaint  Patient presents with  . Diabetes  . Hypertension  . Sore Throat    This visit type was conducted due to national recommendations for restrictions regarding the COVID-19 Pandemic (e.g. social distancing).  This format is felt to be most appropriate for this patient at this time balancing risks to patient and risks to population by having him in for in person visit.  No physical exam was performed (except for noted visual exam or audio findings with Telehealth visits).    Our team/I connected with Kim Singh at  1:00 PM EST by a video enabled telemedicine application ([email or web address] or caregility through epic) and verified that I am speaking with the correct person using two identifiers.  Location patient: [facility] Location provider: [facility], office Persons participating in the virtual visit:  patient  Our team/I discussed the limitations of evaluation and management by telemedicine and the availability of in person appointments. In light of current covid-19 pandemic, patient also understands that we are trying to protect them by minimizing in office contact if at all possible.  The patient expressed consent for telemedicine visit and agreed to proceed. Patient understands insurance will be billed.   Past Medical History-  Patient Active Problem List   Diagnosis Date Noted  . Type II diabetes mellitus with neurological manifestations ([place]) 11/29/2013    Priority: High  . Diabetic neuropathy ([place]) 08/22/2015    Priority: Medium  . Depression 02/28/2015    Priority: Medium  . Dyslipidemia 11/29/2013    Priority: Medium  . Hypothyroidism 11/29/2013    Priority: Medium  . Essential hypertension 11/29/2013    Priority: Medium  . Former smoker 02/28/2015    Priority: Low  . History of breast cancer 11/30/2013    Priority: Low  . History of thyroid cancer 11/30/2013    Priority: Low  .  Orthostatic hypotension 11/29/2013    Priority: Low  . Major depressive disorder with single episode, in full remission ([place]) 09/08/2019  . Insomnia 12/19/2018  . Grief reaction 05/16/2017    Medications- reviewed and updated Current Outpatient Medications  Medication Sig Dispense Refill  . CALCIUM-VITAMIN D PO Take 1 tablet by mouth 2 (two) times daily.     Marland Kitchen desonide (DESOWEN) 0.05 % cream Apply topically 2 (two) times daily. (Patient taking differently: Apply topically as needed.) 30 g 0  . escitalopram (LEXAPRO) 20 MG tablet Take 1 tablet (20 mg total) by mouth daily. 90 tablet 3  . ezetimibe (ZETIA) 10 MG tablet TAKE 1 TABLET DAILY 90 tablet 3  . fluticasone (FLONASE) 50 MCG/ACT nasal spray USE 2 SPRAYS IN EACH NOSTRIL DAILY (Patient taking differently: as needed.) 16 g 11  . levothyroxine (SYNTHROID) 88 MCG tablet Take 1 tablet (88 mcg total) by mouth daily. 90 tablet 3  . metFORMIN (GLUCOPHAGE) 1000 MG tablet Take 1 tablet (1,000 mg total) by mouth daily with breakfast. 90 tablet 3  . nadolol (CORGARD) 20 MG tablet Take 1 tablet (20 mg total) by mouth daily. 90 tablet 3  . nystatin ointment (MYCOSTATIN) as needed.    . Omega-3-acid Ethyl Esters (OMACOR PO) Take 1 tablet by mouth 2 (two) times daily.     . quinapril (ACCUPRIL) 5 MG tablet Take 1 tablet (5 mg total) by mouth at bedtime. 10 tablet 0  . rosuvastatin (CRESTOR) 10 MG tablet TAKE 1 TABLET DAILY 90 tablet 3  . traZODone (DESYREL) 50 MG tablet  Take 0.5 tablets (25 mg total) by mouth at bedtime as needed for sleep. 45 tablet 3   No current facility-administered medications for this visit.     Objective:  No self reported vitals- does not feel feverish Gen: NAD, resting comfortably Lungs: nonlabored, normal respiratory rate  Skin: appears dry, no obvious rash     Assessment and Plan   # Sore Throat S:Patient stated that she started having a sore throat yesterday. She has taken otc cough drops and his been drinking  vitamin c. She states not as severe as strep throat in the past. She was at a big volleyball tournament this weekend and she yelled a lot. No headache, body aches. No fever . Slight cough. Slight runny nose.   No fever, chills,  congestion, runny nose, fatigue, body aches, sore throat, headache, nausea, vomiting, diarrhea, or new loss of taste or smell. No known contacts with covid 19 or someone being tested for covid 19.   She is triple vaccinated from covid 84. Not vaccinated from flu shot- since ill will hold off for now. Son in law sinus infection last year and was not tested for covid.  A/P: Patient with symptoms concerning for potential covid 19, influenza, or strep throat Therefore: - testing options discussed with patient- she will come by here after visit- asked her to try to arrive by 2 and will test for covid, influenza, strep.  - recommended patient watch closely for shortness of breath or confusion or worsening symptoms and if those occur he should contact us immediately  -recommended patient consider purchasing pulse oximeter and if levels 94% or below persistently- seek care at the hospital  -recommended self isolation until negative test  at minimum .  - for self isolation if covid 19 test positive would need to be at least 10 days since first symptom AND at least 24 hours fever free without fever reducing medications AND improvement in respiratory symptoms  - also should inform close contacts if positive -Hopeful results back within 48 hours of test but may take up to a week  - MAB treatment- undecided for now  -conservative care- recommend staying well hydrated, tylenol as needed for throat discomfort, delsym OTC for cough if needed,  relative rest . Thankfully appetite ok  #hypertension S: medication: quinapril 5 mg Home readings #s: has not checked recently BP Readings from Last 3 Encounters:  07/19/20 110/70  01/28/20 118/60  11/16/19 120/62  A/P: hypertension well  controlled on recent check- discussed if appetite were to decrease or have issues with fluids would recommend holding quinapril. Encouraged home monitoring of BP   Recommended follow up: as needed if new or worsening symptoms  Lab/Order associations:   ICD-10-CM   1. Sore throat  J02.9   2. Cough  R05.9   3. Suspected COVID-19 virus infection  Z20.822   4. Essential hypertension  I10    Return precautions advised.  Garret Reddish, MD

## 2020-08-02 ENCOUNTER — Other Ambulatory Visit: Payer: Self-pay

## 2020-08-02 ENCOUNTER — Encounter: Payer: Self-pay | Admitting: Family Medicine

## 2020-08-02 ENCOUNTER — Telehealth (INDEPENDENT_AMBULATORY_CARE_PROVIDER_SITE_OTHER): Payer: Medicare Other | Admitting: Family Medicine

## 2020-08-02 DIAGNOSIS — I10 Essential (primary) hypertension: Secondary | ICD-10-CM

## 2020-08-02 DIAGNOSIS — R059 Cough, unspecified: Secondary | ICD-10-CM

## 2020-08-02 DIAGNOSIS — J029 Acute pharyngitis, unspecified: Secondary | ICD-10-CM

## 2020-08-02 DIAGNOSIS — E1149 Type 2 diabetes mellitus with other diabetic neurological complication: Secondary | ICD-10-CM

## 2020-08-02 DIAGNOSIS — Z20822 Contact with and (suspected) exposure to covid-19: Secondary | ICD-10-CM

## 2020-08-02 LAB — POCT INFLUENZA A/B
Influenza A, POC: NEGATIVE
Influenza B, POC: NEGATIVE

## 2020-08-02 LAB — POCT RAPID STREP A (OFFICE): Rapid Strep A Screen: NEGATIVE

## 2020-08-02 NOTE — Addendum Note (Signed)
Addended by: Thomes Cake on: 08/02/2020 02:15 PM   Modules accepted: Orders

## 2020-08-03 ENCOUNTER — Telehealth: Payer: Medicare Other | Admitting: Physician Assistant

## 2020-08-05 LAB — NOVEL CORONAVIRUS, NAA: SARS-CoV-2, NAA: NOT DETECTED

## 2020-08-05 LAB — SARS-COV-2, NAA 2 DAY TAT

## 2020-08-25 ENCOUNTER — Other Ambulatory Visit: Payer: Self-pay | Admitting: Family Medicine

## 2020-09-26 ENCOUNTER — Other Ambulatory Visit: Payer: Self-pay | Admitting: Family Medicine

## 2020-11-05 ENCOUNTER — Other Ambulatory Visit: Payer: Self-pay

## 2020-11-05 ENCOUNTER — Ambulatory Visit
Admission: RE | Admit: 2020-11-05 | Discharge: 2020-11-05 | Disposition: A | Discharge status: Home / Self Care | Payer: Medicare Other | Source: Ambulatory Visit | Attending: Emergency Medicine | Admitting: Emergency Medicine

## 2020-11-05 VITALS — BP 119/76 | HR 59 | Temp 97.9°F | Resp 18

## 2020-11-05 DIAGNOSIS — R0981 Nasal congestion: Secondary | ICD-10-CM | POA: Diagnosis not present

## 2020-11-05 DIAGNOSIS — J019 Acute sinusitis, unspecified: Secondary | ICD-10-CM

## 2020-11-05 MED ORDER — AMOXICILLIN-POT CLAVULANATE 875-125 MG PO TABS: 1.0000 | ORAL_TABLET | Freq: Two times a day (BID) | ORAL | 0 refills | Status: AC

## 2020-11-05 NOTE — ED Triage Notes (Signed)
Was sitting out side last night and now her throat feels scratchy.   Runny nose.

## 2020-11-05 NOTE — ED Provider Notes (Signed)
[place]   [id number] 11/05/20 Arrival Time: 8676   CC: sinus congestion  SUBJECTIVE: History from: patient.  Kim Singh is a 80 y.o. female who presents with runny nose, sinus congestion, and sore throat x 1 day.  Denies sick exposure to COVID, flu or strep.  Playing cards outside.  Denies alleviating factors.  Denies aggravating factors. Reports previous symptoms in the past.   Denies fever, chills, SOB, wheezing, chest pain, nausea, changes in bowel or bladder habits.    ROS: As per HPI.  All other pertinent ROS negative.     Past Medical History:  Diagnosis Date  . Breast cancer ([place]) 2001   Left  . Diabetes mellitus type 2, uncomplicated ([place])   . Personal history of chemotherapy   . Personal history of radiation therapy   . Pure hypercholesterolemia   . Unspecified disorder of thyroid   . Unspecified essential hypertension    Past Surgical History:  Procedure Laterality Date  . BREAST EXCISIONAL BIOPSY Left   . BREAST LUMPECTOMY Left 2001  . THYROIDECTOMY     total   Allergies  Allergen Reactions  . Codeine Other (See Comments)    GI upset   No current facility-administered medications on file prior to encounter.   Current Outpatient Medications on File Prior to Encounter  Medication Sig Dispense Refill  . CALCIUM-VITAMIN D PO Take 1 tablet by mouth 2 (two) times daily.     Marland Kitchen COVID-19 mRNA vaccine, Pfizer, 30 MCG/0.3ML injection INJECT AS DIRECTED .3 mL 0  . desonide (DESOWEN) 0.05 % cream Apply topically 2 (two) times daily. (Patient taking differently: Apply topically as needed.) 30 g 0  . escitalopram (LEXAPRO) 20 MG tablet Take 1 tablet (20 mg total) by mouth daily. 90 tablet 3  . ezetimibe (ZETIA) 10 MG tablet TAKE 1 TABLET DAILY 90 tablet 3  . fluticasone (FLONASE) 50 MCG/ACT nasal spray USE 2 SPRAYS IN EACH NOSTRIL DAILY (Patient taking differently: as needed.) 16 g 11  . levothyroxine (SYNTHROID) 88 MCG tablet Take 1 tablet (88 mcg total)  by mouth daily. 90 tablet 3  . metFORMIN (GLUCOPHAGE) 1000 MG tablet Take 1 tablet (1,000 mg total) by mouth daily with breakfast. 90 tablet 3  . nadolol (CORGARD) 20 MG tablet TAKE 1 TABLET DAILY 90 tablet 3  . nystatin ointment (MYCOSTATIN) as needed.    . Omega-3-acid Ethyl Esters (OMACOR PO) Take 1 tablet by mouth 2 (two) times daily.     . quinapril (ACCUPRIL) 5 MG tablet Take 1 tablet (5 mg total) by mouth at bedtime. 10 tablet 0  . rosuvastatin (CRESTOR) 10 MG tablet TAKE 1 TABLET DAILY 90 tablet 3  . traZODone (DESYREL) 50 MG tablet Take 0.5 tablets (25 mg total) by mouth at bedtime as needed for sleep. 45 tablet 3   Social History   Socioeconomic History  . Marital status: Widowed    Spouse name: Not on file  . Number of children: Not on file  . Years of education: Not on file  . Highest education level: Not on file  Occupational History  . Not on file  Tobacco Use  . Smoking status: Former Smoker    Packs/day: 1.00    Years: 41.00    Pack years: 41.00    Types: Cigarettes    Quit date: 08/02/1997    Years since quitting: 23.2  . Smokeless tobacco: Never Used  Vaping Use  . Vaping Use: Never used  Substance and Sexual Activity  .  Alcohol use: Yes    Alcohol/week: 7.0 standard drinks    Types: 7 Glasses of wine per week    Comment: glass of wine every day  . Drug use: No  . Sexual activity: Not Currently  Other Topics Concern  . Not on file  Social History Narrative   Widowed 2008. 3 children. 4 grandkids. No greatgrandkids.    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       Hobbies: in [place] had a widowed group, here joined a church with kids, Psychologist, occupational work. Looking for friends network   Social Determinants of Health   Financial Resource Strain: Not on file  Food Insecurity: Not on file  Transportation Needs: Not on file   Physical Activity: Not on file  Stress: Not on file  Social Connections: Not on file  Intimate Partner Violence: Not on file   Family History  Problem Relation Age of Onset  . Diabetes Mother   . Hearing loss Father   . Depression Son   . Cancer Sister        Bile Duct Cancer    OBJECTIVE:  Vitals:   11/05/20 1356  BP: 119/76  Pulse: (!) 59  Resp: 18  Temp: 97.9 F (36.6 C)  TempSrc: Oral  SpO2: 92%     General appearance: alert; appears fatigued, but nontoxic; speaking in full sentences and tolerating own secretions HEENT: NCAT; Ears: EACs clear, TMs pearly gray; Eyes: PERRL.  EOM grossly intact. Sinuses: nontender; Nose: nares patent with clear rhinorrhea, Throat: oropharynx clear, tonsils non erythematous or enlarged, uvula midline  Neck: supple without LAD Lungs: unlabored respirations, symmetrical air entry; cough: absent; no respiratory distress; CTAB Heart: regular rate and rhythm.   Skin: warm and dry Psychological: alert and cooperative; normal mood and affect   ASSESSMENT & PLAN:  1. Sinus congestion   2. Acute non-recurrent sinusitis, unspecified location     Meds ordered this encounter  Medications  . amoxicillin-clavulanate (AUGMENTIN) 875-125 MG tablet    Sig: Take 1 tablet by mouth every 12 (twelve) hours for 10 days.    Dispense:  20 tablet    Refill:  0    Order Specific Question:   Supervising Provider    Answer:   Raylene Everts [[id number]]    Get plenty of rest and push fluids Augmentin for possible sinus infect.  Please do not fill this medication for at least 3-4 days. Use OTC zyrtec for nasal congestion, runny nose, and/or sore throat Use OTC flonase for nasal congestion and runny nose Use medications daily for symptom relief Use OTC medications like ibuprofen or tylenol as needed fever or pain Call or go to the ED if you have any new or worsening symptoms such as fever, cough, shortness of breath, chest tightness, chest pain,  turning blue, changes in mental status, etc...   Reviewed expectations re: course of current medical issues. Questions answered. Outlined signs and symptoms indicating need for more acute intervention. Patient verbalized understanding. After Visit Summary given.         Lestine Box, PA-C 11/05/20 1446

## 2020-11-05 NOTE — Discharge Instructions (Signed)
Get plenty of rest and push fluids Augmentin for possible sinus infect.  Please do not fill this medication for at least 3-4 days. Use OTC zyrtec for nasal congestion, runny nose, and/or sore throat Use OTC flonase for nasal congestion and runny nose Use medications daily for symptom relief Use OTC medications like ibuprofen or tylenol as needed fever or pain Call or go to the ED if you have any new or worsening symptoms such as fever, cough, shortness of breath, chest tightness, chest pain, turning blue, changes in mental status, etc..Marland Kitchen

## 2020-11-14 ENCOUNTER — Other Ambulatory Visit: Payer: Self-pay | Admitting: Family Medicine

## 2020-11-29 ENCOUNTER — Other Ambulatory Visit: Payer: Self-pay | Admitting: Family Medicine

## 2020-12-20 ENCOUNTER — Ambulatory Visit
Admission: EM | Admit: 2020-12-20 | Discharge: 2020-12-20 | Disposition: A | Discharge status: Home / Self Care | Payer: Medicare Other | Attending: Family Medicine | Admitting: Family Medicine

## 2020-12-20 DIAGNOSIS — B351 Tinea unguium: Secondary | ICD-10-CM

## 2020-12-20 MED ORDER — FLUCONAZOLE 200 MG PO TABS: 200.0000 mg | ORAL_TABLET | ORAL | 0 refills | Status: AC

## 2020-12-20 NOTE — Discharge Instructions (Signed)
I have sent in fluconazole for 200mg  once a week for four doses  Follow up with this office or with primary care if symptoms are persisting.  Follow up in the ER for high fever, trouble swallowing, trouble breathing, other concerning symptoms.

## 2020-12-20 NOTE — ED Triage Notes (Signed)
Pt states that her great, right toe is infected. x2 months

## 2020-12-23 NOTE — ED Provider Notes (Signed)
[facility]    CSN: [id number] Arrival date & time: 12/20/20  1508      History   Chief Complaint Chief Complaint  Patient presents with  . Nail Problem    Pt states that her right great toe is infected. X2 months    HPI Kim Singh is a 80 y.o. female.   Reports left great toenail infection for the last 2 months. Reports that the nail feels brittle and dry and like it may fall off. Reports that she gets pedicures often. Denies pain, drainage, bleeding, tenderness, erythema, other symptoms. Has not attempted OTC treatment. Denies previous symptoms.   ROS per HPI  The history is provided by the patient.    Past Medical History:  Diagnosis Date  . Breast cancer ([place]) 2001   Left  . Diabetes mellitus type 2, uncomplicated ([place])   . Personal history of chemotherapy   . Personal history of radiation therapy   . Pure hypercholesterolemia   . Unspecified disorder of thyroid   . Unspecified essential hypertension     Patient Active Problem List   Diagnosis Date Noted  . Major depressive disorder with single episode, in full remission ([place]) 09/08/2019  . Insomnia 12/19/2018  . Grief reaction 05/16/2017  . Diabetic neuropathy ([place]) 08/22/2015  . Depression 02/28/2015  . Former smoker 02/28/2015  . History of breast cancer 11/30/2013  . History of thyroid cancer 11/30/2013  . Orthostatic hypotension 11/29/2013  . Dyslipidemia 11/29/2013  . Hypothyroidism 11/29/2013  . Type II diabetes mellitus with neurological manifestations ([place]) 11/29/2013  . Essential hypertension 11/29/2013    Past Surgical History:  Procedure Laterality Date  . BREAST EXCISIONAL BIOPSY Left   . BREAST LUMPECTOMY Left 2001  . THYROIDECTOMY     total    OB History   No obstetric history on file.      Home Medications    Prior to Admission medications   Medication Sig Start Date End Date Taking? Authorizing Provider  CALCIUM-VITAMIN D PO Take 1 tablet by mouth 2 (two)  times daily.    Yes [provider]  COVID-19 mRNA vaccine, Pfizer, 30 MCG/0.3ML injection INJECT AS DIRECTED 05/11/20 05/11/21 Yes Carlyle Basques, MD  desonide (DESOWEN) 0.05 % cream Apply topically 2 (two) times daily. Patient taking differently: Apply topically as needed. 09/23/19  Yes Inda Coke, PA  escitalopram (LEXAPRO) 20 MG tablet Take 1 tablet (20 mg total) by mouth daily. 01/28/20  Yes Marin Olp, MD  ezetimibe (ZETIA) 10 MG tablet TAKE 1 TABLET DAILY 01/26/20  Yes Marin Olp, MD  fluconazole (DIFLUCAN) 200 MG tablet Take 1 tablet (200 mg total) by mouth once a week for 4 doses. 12/20/20 01/11/21 Yes Faustino Congress, NP  fluticasone (FLONASE) 50 MCG/ACT nasal spray USE 2 SPRAYS IN EACH NOSTRIL DAILY Patient taking differently: as needed. 05/10/20  Yes Marin Olp, MD  levothyroxine (SYNTHROID) 88 MCG tablet Take 1 tablet (88 mcg total) by mouth daily. 02/01/20  Yes Marin Olp, MD  metFORMIN (GLUCOPHAGE) 1000 MG tablet TAKE 1 TABLET DAILY WITH BREAKFAST 11/14/20  Yes Marin Olp, MD  nadolol (CORGARD) 20 MG tablet TAKE 1 TABLET DAILY 08/25/20  Yes Marin Olp, MD  nystatin ointment (MYCOSTATIN) as needed. 11/03/19  Yes [provider]  Omega-3-acid Ethyl Esters (OMACOR PO) Take 1 tablet by mouth 2 (two) times daily.    Yes [provider]  quinapril (ACCUPRIL) 5 MG tablet TAKE 1 TABLET AT BEDTIME 11/29/20  Yes Marin Olp, MD  rosuvastatin (CRESTOR) 10 MG tablet TAKE 1 TABLET DAILY 09/26/20  Yes Marin Olp, MD  traZODone (DESYREL) 50 MG tablet Take 0.5 tablets (25 mg total) by mouth at bedtime as needed for sleep. 01/28/20  Yes Marin Olp, MD    Family History Family History  Problem Relation Age of Onset  . Diabetes Mother   . Hearing loss Father   . Depression Son   . Cancer Sister        Bile Duct Cancer    Social History Social History   Tobacco Use  . Smoking status: Former Smoker     Packs/day: 1.00    Years: 41.00    Pack years: 41.00    Types: Cigarettes    Quit date: 08/02/1997    Years since quitting: 23.4  . Smokeless tobacco: Never Used  Vaping Use  . Vaping Use: Never used  Substance Use Topics  . Alcohol use: Yes    Alcohol/week: 7.0 standard drinks    Types: 7 Glasses of wine per week    Comment: glass of wine every day  . Drug use: No     Allergies   Codeine   Review of Systems Review of Systems   Physical Exam Triage Vital Signs ED Triage Vitals  Enc Vitals Group     BP 12/20/20 1652 126/64     Pulse Rate 12/20/20 1652 62     Resp --      Temp 12/20/20 1652 97.8 F (36.6 C)     Temp Source 12/20/20 1652 Oral     SpO2 12/20/20 1652 90 %     Weight 12/20/20 1650 175 lb (79.4 kg)     Height 12/20/20 1650 5\' 8"  (1.727 m)     Head Circumference --      Peak Flow --      Pain Score 12/20/20 1650 0     Pain Loc --      Pain Edu? --      Excl. in [place]? --    No data found.  Updated Vital Signs BP 126/64 (BP Location: Right Arm)   Pulse 62   Temp 97.8 F (36.6 C) (Oral)   Ht 5\' 8"  (1.727 m)   Wt 175 lb (79.4 kg)   SpO2 90%   BMI 26.61 kg/m   Visual Acuity Right Eye Distance:   Left Eye Distance:   Bilateral Distance:    Right Eye Near:   Left Eye Near:    Bilateral Near:     Physical Exam Vitals and nursing note reviewed.  Constitutional:      General: She is not in acute distress.    Appearance: Normal appearance. She is well-developed.  HENT:     Head: Normocephalic and atraumatic.     Mouth/Throat:     Mouth: Mucous membranes are moist.     Pharynx: Oropharynx is clear.  Eyes:     Extraocular Movements: Extraocular movements intact.     Conjunctiva/sclera: Conjunctivae normal.     Pupils: Pupils are equal, round, and reactive to light.  Cardiovascular:     Rate and Rhythm: Normal rate and regular rhythm.  Pulmonary:     Effort: Pulmonary effort is normal. No respiratory distress.  Musculoskeletal:         General: Normal range of motion.     Cervical back: Normal range of motion and neck supple.  Skin:    General: Skin is warm and dry.  Capillary Refill: Capillary refill takes less than 2 seconds.     Comments: Left great toe nail thickened and yellowed compared to other nails, consistent with fungal infection   Neurological:     General: No focal deficit present.     Mental Status: She is alert and oriented to person, place, and time.  Psychiatric:        Mood and Affect: Mood normal.        Behavior: Behavior normal.        Thought Content: Thought content normal.      UC Treatments / Results  Labs (all labs ordered are listed, but only abnormal results are displayed) Labs Reviewed - No data to display  EKG   Radiology No results found.  Procedures Procedures (including critical care time)  Medications Ordered in UC Medications - No data to display  Initial Impression / Assessment and Plan / UC Course  I have reviewed the triage vital signs and the nursing notes.  Pertinent labs & imaging results that were available during my care of the patient were reviewed by me and considered in my medical decision making (see chart for details).    Nail Fungus  Prescribed fluconazole 200mg  once weekly for 4 weeks Follow up with this office or with primary care if symptoms are persisting.  Follow up in the ER for high fever, trouble swallowing, trouble breathing, other concerning symptoms.   Final Clinical Impressions(s) / UC Diagnoses   Final diagnoses:  Nail fungus     Discharge Instructions     I have sent in fluconazole for 200mg  once a week for four doses  Follow up with this office or with primary care if symptoms are persisting.  Follow up in the ER for high fever, trouble swallowing, trouble breathing, other concerning symptoms.     ED Prescriptions    Medication Sig Dispense Auth. Provider   fluconazole (DIFLUCAN) 200 MG tablet Take 1 tablet (200  mg total) by mouth once a week for 4 doses. 4 tablet Faustino Congress, NP     PDMP not reviewed this encounter.   Faustino Congress, NP 12/23/20 1709

## 2021-01-20 ENCOUNTER — Other Ambulatory Visit: Payer: Self-pay | Admitting: Family Medicine

## 2021-01-24 ENCOUNTER — Ambulatory Visit: Payer: Medicare Other

## 2021-01-24 ENCOUNTER — Other Ambulatory Visit: Payer: Self-pay

## 2021-01-24 ENCOUNTER — Ambulatory Visit
Admission: EM | Admit: 2021-01-24 | Discharge: 2021-01-24 | Disposition: A | Discharge status: Home / Self Care | Payer: Medicare Other | Attending: Family Medicine | Admitting: Family Medicine

## 2021-01-24 DIAGNOSIS — R0981 Nasal congestion: Secondary | ICD-10-CM

## 2021-01-24 DIAGNOSIS — R0982 Postnasal drip: Secondary | ICD-10-CM

## 2021-01-24 MED ORDER — CETIRIZINE HCL 5 MG PO TABS: 5.0000 mg | ORAL_TABLET | Freq: Every day | ORAL | 0 refills | Status: DC

## 2021-01-24 MED ORDER — FLUTICASONE PROPIONATE 50 MCG/ACT NA SUSP: 2.0000 | Freq: Every day | NASAL | 11 refills | Status: DC

## 2021-01-24 NOTE — ED Provider Notes (Signed)
[facility]    CSN: [id number] Arrival date & time: 01/24/21  1234      History   Chief Complaint Chief Complaint  Patient presents with   Sore Throat    HPI Kim Singh is a 80 y.o. female.   HPI Patient presents today with concern of 2 days of esophageal irritation along with postnasal drainage.  Patient recently traveled to [place] however reports symptoms only began 2 days ago which was after her return home.  She denies any cough, shortness of breath or chest tightness.  She has been afebrile.  Past Medical History:  Diagnosis Date   Breast cancer ([place]) 2001   Left   Diabetes mellitus type 2, uncomplicated ([place])    Personal history of chemotherapy    Personal history of radiation therapy    Pure hypercholesterolemia    Unspecified disorder of thyroid    Unspecified essential hypertension     Patient Active Problem List   Diagnosis Date Noted   Major depressive disorder with single episode, in full remission ([place]) 09/08/2019   Insomnia 12/19/2018   Grief reaction 05/16/2017   Diabetic neuropathy ([place]) 08/22/2015   Depression 02/28/2015   Former smoker 02/28/2015   History of breast cancer 11/30/2013   History of thyroid cancer 11/30/2013   Orthostatic hypotension 11/29/2013   Dyslipidemia 11/29/2013   Hypothyroidism 11/29/2013   Type II diabetes mellitus with neurological manifestations ([place]) 11/29/2013   Essential hypertension 11/29/2013    Past Surgical History:  Procedure Laterality Date   BREAST EXCISIONAL BIOPSY Left    BREAST LUMPECTOMY Left 2001   THYROIDECTOMY     total    OB History   No obstetric history on file.      Home Medications    Prior to Admission medications   Medication Sig Start Date End Date Taking? Authorizing Provider  CALCIUM-VITAMIN D PO Take 1 tablet by mouth 2 (two) times daily.     [provider]  COVID-19 mRNA vaccine, Pfizer, 30 MCG/0.3ML injection INJECT AS DIRECTED 05/11/20 05/11/21   Carlyle Basques, MD  desonide (DESOWEN) 0.05 % cream Apply topically 2 (two) times daily. Patient taking differently: Apply topically as needed. 09/23/19   Inda Coke, PA  escitalopram (LEXAPRO) 20 MG tablet Take 1 tablet (20 mg total) by mouth daily. 01/28/20   Marin Olp, MD  ezetimibe (ZETIA) 10 MG tablet Take 1 tablet (10 mg total) by mouth daily. Need visit to get refills 01/20/21   Marin Olp, MD  fluticasone [facility]) 50 MCG/ACT nasal spray USE 2 SPRAYS IN [facility] NOSTRIL DAILY Patient taking differently: as needed. 05/10/20   Marin Olp, MD  levothyroxine (SYNTHROID) 88 MCG tablet Take 1 tablet (88 mcg total) by mouth daily. 02/01/20   Marin Olp, MD  metFORMIN (GLUCOPHAGE) 1000 MG tablet TAKE 1 TABLET DAILY WITH BREAKFAST 11/14/20   Marin Olp, MD  nadolol (CORGARD) 20 MG tablet TAKE 1 TABLET DAILY 08/25/20   Marin Olp, MD  nystatin ointment (MYCOSTATIN) as needed. 11/03/19   [provider]  Omega-3-acid Ethyl Esters (OMACOR PO) Take 1 tablet by mouth 2 (two) times daily.     [provider]  quinapril (ACCUPRIL) 5 MG tablet TAKE 1 TABLET AT BEDTIME 11/29/20   Marin Olp, MD  rosuvastatin (CRESTOR) 10 MG tablet TAKE 1 TABLET DAILY 09/26/20   Marin Olp, MD  traZODone (DESYREL) 50 MG tablet Take 0.5 tablets (25 mg total) by mouth at bedtime as  needed for sleep. 01/28/20   Marin Olp, MD    Family History Family History  Problem Relation Age of Onset   Diabetes Mother    Hearing loss Father    Depression Son    Cancer Sister        Bile Duct Cancer    Social History Social History   Tobacco Use   Smoking status: Former    Packs/day: 1.00    Years: 41.00    Pack years: 41.00    Types: Cigarettes    Quit date: 08/02/1997    Years since quitting: 23.4   Smokeless tobacco: Never  Vaping Use   Vaping Use: Never used  Substance Use Topics   Alcohol use: Yes    Alcohol/week: 7.0 standard drinks    Types:  7 Glasses of wine per week    Comment: glass of wine every day   Drug use: No     Allergies   Codeine   Review of Systems Review of Systems Pertinent negatives listed in HPI   Physical Exam Triage Vital Signs ED Triage Vitals  Enc Vitals Group     BP 01/24/21 1256 103/72     Pulse Rate 01/24/21 1256 78     Resp 01/24/21 1256 18     Temp 01/24/21 1256 98.6 F (37 C)     Temp Source 01/24/21 1256 Tympanic     SpO2 01/24/21 1256 98 %     Weight --      Height --      Head Circumference --      Peak Flow --      Pain Score 01/24/21 1310 5     Pain Loc --      Pain Edu? --      Excl. in [place]? --    No data found.  Updated Vital Signs BP 103/72 (BP Location: Right Arm)   Pulse 78   Temp 98.6 F (37 C) (Tympanic)   Resp 18   SpO2 98%   Visual Acuity Right Eye Distance:   Left Eye Distance:   Bilateral Distance:    Right Eye Near:   Left Eye Near:    Bilateral Near:     Physical Exam  General Appearance:    Alert, cooperative, no distress  HENT: Normocephalic, ears unremarkable with hearing aids, post nasal drip noted and nasal mucosa congested  Eyes:    PERRL, conjunctiva/corneas clear, EOM's intact       Lungs:     Clear to auscultation bilaterally, respirations unlabored  Heart:    Regular rate and rhythm  Neurologic:   Awake, alert, oriented x 3. No apparent focal neurological      defect.         UC Treatments / Results  Labs (all labs ordered are listed, but only abnormal results are displayed) Labs Reviewed - No data to display  EKG   Radiology No results found.  Procedures Procedures (including critical care time)  Medications Ordered in UC Medications - No data to display  Initial Impression / Assessment and Plan / UC Course  I have reviewed the triage vital signs and the nursing notes.  Pertinent labs & imaging results that were available during my care of the patient were reviewed by me and considered in my medical decision making  (see chart for details).    Postnasal drip along with sinus congestion symptoms appear viral in etiology.  Patient is well-appearing on exam some mild congestion and  oropharyngeal normal.  Treatment today with Flonase and cetirizine.  Counseled on taking chronically as symptoms are allergy related.  Final Clinical Impressions(s) / UC Diagnoses   Final diagnoses:  Post-nasal drip  Sinus congestion   Discharge Instructions   None    ED Prescriptions     Medication Sig Dispense Auth. Provider   fluticasone (FLONASE) 50 MCG/ACT nasal spray Place 2 sprays into both nostrils daily. 16 g Scot Jun, FNP   cetirizine (ZYRTEC) 5 MG tablet Take 1 tablet (5 mg total) by mouth daily. 90 tablet Scot Jun, FNP      PDMP not reviewed this encounter.   Scot Jun, [place] 01/24/21 [phone]

## 2021-01-24 NOTE — ED Triage Notes (Signed)
Pt presents with c/o sore throat for past couple of days, some nasal congestion as well

## 2021-02-16 ENCOUNTER — Encounter: Payer: Self-pay | Admitting: Podiatry

## 2021-02-16 ENCOUNTER — Other Ambulatory Visit: Payer: Self-pay

## 2021-02-16 ENCOUNTER — Ambulatory Visit (INDEPENDENT_AMBULATORY_CARE_PROVIDER_SITE_OTHER): Payer: Medicare Other | Admitting: Podiatry

## 2021-02-16 DIAGNOSIS — L603 Nail dystrophy: Secondary | ICD-10-CM | POA: Diagnosis not present

## 2021-02-16 DIAGNOSIS — B351 Tinea unguium: Secondary | ICD-10-CM | POA: Diagnosis not present

## 2021-02-16 NOTE — Progress Notes (Signed)
Subjective:  Patient ID: Kim Singh, female    DOB: [birth year],  MRN: [id number] HPI Chief Complaint  Patient presents with   Nail Problem    Toenails - thick, discolored x years, tender sometimes lateral border   Diabetes   New Patient (Initial Visit)    Est pt 07/2017    80 y.o. female presents with the above complaint.   ROS: Denies fever chills nausea vomiting muscle aches pains calf pain back pain chest pain shortness of breath.  Past Medical History:  Diagnosis Date   Breast cancer ([place]) 2001   Left   Diabetes mellitus type 2, uncomplicated ([place])    Personal history of chemotherapy    Personal history of radiation therapy    Pure hypercholesterolemia    Unspecified disorder of thyroid    Unspecified essential hypertension    Past Surgical History:  Procedure Laterality Date   BREAST EXCISIONAL BIOPSY Left    BREAST LUMPECTOMY Left 2001   THYROIDECTOMY     total    Current Outpatient Medications:    CALCIUM-VITAMIN D PO, Take 1 tablet by mouth 2 (two) times daily. , Disp: , Rfl:    cetirizine (ZYRTEC) 5 MG tablet, Take 1 tablet (5 mg total) by mouth daily., Disp: 90 tablet, Rfl: 0   COVID-19 mRNA vaccine, Pfizer, 30 MCG/0.3ML injection, INJECT AS DIRECTED, Disp: .3 mL, Rfl: 0   desonide (DESOWEN) 0.05 % cream, Apply topically 2 (two) times daily. (Patient taking differently: Apply topically as needed.), Disp: 30 g, Rfl: 0   escitalopram (LEXAPRO) 20 MG tablet, Take 1 tablet (20 mg total) by mouth daily., Disp: 90 tablet, Rfl: 3   ezetimibe (ZETIA) 10 MG tablet, Take 1 tablet (10 mg total) by mouth daily. Need visit to get refills, Disp: 90 tablet, Rfl: 0   fluticasone (FLONASE) 50 MCG/ACT nasal spray, Place 2 sprays into both nostrils daily., Disp: 16 g, Rfl: 11   levothyroxine (SYNTHROID) 100 MCG tablet, Take 100 mcg by mouth daily., Disp: , Rfl:    metFORMIN (GLUCOPHAGE) 1000 MG tablet, TAKE 1 TABLET DAILY WITH BREAKFAST, Disp: 90 tablet, Rfl: 3   nadolol  (CORGARD) 20 MG tablet, TAKE 1 TABLET DAILY, Disp: 90 tablet, Rfl: 3   nystatin ointment (MYCOSTATIN), as needed., Disp: , Rfl:    Omega-3-acid Ethyl Esters (OMACOR PO), Take 1 tablet by mouth 2 (two) times daily. , Disp: , Rfl:    quinapril (ACCUPRIL) 5 MG tablet, TAKE 1 TABLET AT BEDTIME, Disp: 90 tablet, Rfl: 3   rosuvastatin (CRESTOR) 10 MG tablet, TAKE 1 TABLET DAILY, Disp: 90 tablet, Rfl: 3   traZODone (DESYREL) 50 MG tablet, Take 0.5 tablets (25 mg total) by mouth at bedtime as needed for sleep., Disp: 45 tablet, Rfl: 3  Allergies  Allergen Reactions   Codeine Other (See Comments)    GI upset   Review of Systems Objective:  There were no vitals filed for this visit.  General: Well developed, nourished, in no acute distress, alert and oriented x3   Dermatological: Skin is warm, dry and supple bilateral. Nails x 10 are well maintained; remaining integument appears unremarkable at this time. There are no open sores, no preulcerative lesions, no rash or signs of infection present.  Hallux left demonstrates distal onycholysis with discoloration.  Does not appear to be ingrown.  Vascular: Dorsalis Pedis artery and Posterior Tibial artery pedal pulses are 2/4 bilateral with immedate capillary fill time. Pedal hair growth present. No varicosities and no lower extremity edema present  bilateral.   Neruologic: Grossly intact via light touch bilateral. Vibratory intact via tuning fork bilateral. Protective threshold with Semmes Wienstein monofilament intact to all pedal sites bilateral. Patellar and Achilles deep tendon reflexes 2+ bilateral. No Babinski or clonus noted bilateral.   Musculoskeletal: No gross boney pedal deformities bilateral. No pain, crepitus, or limitation noted with foot and ankle range of motion bilateral. Muscular strength 5/5 in all groups tested bilateral.  Gait: Unassisted, Nonantalgic.    Radiographs:  None taken  Assessment & Plan:   Assessment: Nail dystrophy  hallux left  Plan: Discussed etiology pathology conservative surgical therapies took samples of skin and nail today to be sent for pathologic evaluation.  We discussed her imbalance and offered her physical therapy but she declined.     Kafi Dotter T. [place], [place]

## 2021-03-03 NOTE — Progress Notes (Signed)
Phone [phone] In person visit   Subjective:   Kim Singh is a 80 y.o. year old very pleasant female patient who presents for/with See problem oriented charting Chief Complaint  Patient presents with   Hyperlipidemia   Hypertension   Hypothyroidism   This visit occurred during the SARS-CoV-2 public health emergency.  Safety protocols were in place, including screening questions prior to the visit, additional usage of staff PPE, and extensive cleaning of exam room while observing appropriate contact time as indicated for disinfecting solutions.   Past Medical History-  Patient Active Problem List   Diagnosis Date Noted   Type II diabetes mellitus with neurological manifestations ([place]) 11/29/2013    Priority: High   Diabetic neuropathy ([place]) 08/22/2015    Priority: Medium   Depression 02/28/2015    Priority: Medium   Dyslipidemia 11/29/2013    Priority: Medium   Hypothyroidism 11/29/2013    Priority: Medium   Essential hypertension 11/29/2013    Priority: Medium   Former smoker 02/28/2015    Priority: Low   History of breast cancer 11/30/2013    Priority: Low   History of thyroid cancer 11/30/2013    Priority: Low   Orthostatic hypotension 11/29/2013    Priority: Low   Major depressive disorder with single episode, in full remission ([place]) 09/08/2019   Insomnia 12/19/2018   Grief reaction 05/16/2017    Medications- reviewed and updated Current Outpatient Medications  Medication Sig Dispense Refill   CALCIUM-VITAMIN D PO Take 1 tablet by mouth 2 (two) times daily.      escitalopram (LEXAPRO) 20 MG tablet Take 1 tablet (20 mg total) by mouth daily. 90 tablet 3   ezetimibe (ZETIA) 10 MG tablet Take 1 tablet (10 mg total) by mouth daily. Need visit to get refills 90 tablet 0   fluticasone (FLONASE) 50 MCG/ACT nasal spray Place 2 sprays into both nostrils daily. (Patient taking differently: Place 2 sprays into both nostrils as needed.) 16 g 11   levothyroxine  (SYNTHROID) 100 MCG tablet Take 100 mcg by mouth daily.     metFORMIN (GLUCOPHAGE) 1000 MG tablet TAKE 1 TABLET DAILY WITH BREAKFAST 90 tablet 3   nadolol (CORGARD) 20 MG tablet TAKE 1 TABLET DAILY 90 tablet 3   nystatin ointment (MYCOSTATIN) as needed.     Omega-3-acid Ethyl Esters (OMACOR PO) Take 1 tablet by mouth 2 (two) times daily.      quinapril (ACCUPRIL) 5 MG tablet TAKE 1 TABLET AT BEDTIME 90 tablet 3   rosuvastatin (CRESTOR) 10 MG tablet TAKE 1 TABLET DAILY 90 tablet 3   traZODone (DESYREL) 50 MG tablet Take 0.5 tablets (25 mg total) by mouth at bedtime as needed for sleep. 45 tablet 3   cetirizine (ZYRTEC) 5 MG tablet Take 1 tablet (5 mg total) by mouth daily. 90 tablet 3   No current facility-administered medications for this visit.     Objective:  BP 110/66   Pulse 60   Temp 98.6 F (37 C) (Temporal)   Ht '5\' 8"'$  (1.727 m)   Wt 168 lb (76.2 kg)   SpO2 95%   BMI 25.54 kg/m  Gen: NAD, resting comfortably CV: RRR no murmurs rubs or gallops Lungs: CTAB no crackles, wheeze, rhonchi Ext: no edema Skin: warm, dry Neuro: grossly normal, moves all extremities  Diabetic Foot Exam - Simple   Simple Foot Form Diabetic Foot exam was performed with the following findings: Yes 03/06/2021  1:22 PM  Visual Inspection No deformities, no ulcerations, no  other skin breakdown bilaterally: Yes Sensation Testing Intact to touch and monofilament testing bilaterally: Yes Pulse Check Posterior Tibialis and Dorsalis pulse intact bilaterally: Yes Comments Not 100% consistent on distal portions with monofilament but can go back over same spot later and she detects.         Assessment and Plan   #Urgent care visit on 01/24/2021 for post-nasal drip  S:Patient reported that she had 2 days of concern with esophageal irritation along with postnasal drainage. She travelled to [place] around that time however  said her symptoms started 2 days ago before visit. Denied coughing, shortness of  breath or chest tightness. She has been afebrile. -zyrtec 5 mg daily and flonase 50 mcg daily advised as thought to be potential allergies versus viral etiology A/P: doing much better. She feels helping her allergies so she plans to continue that   #Urgent care visit on 12/20/2020 for nail fungus  S:Patient reported left great toenail infection for the last 2 months. She described her nail felt brittle and dry and seem like it may fall off. She included she get pedicures. Denied any pain, drainage, bleeding or tenderness.  She was prescribed fluconazole 200 mg once weekly for 4 weeks A/P: reports improvement in symptoms-  no continued use  #Diabetes with neuropathy.  S: Metformin 1000 mg daily with breakfast. Not on medicine for neuropathy- didn't want to take anything previously Exercise and diet- not exercising- encouraged to start. Reports prefrs to be lazy. She has been eating better though. Weight down 6 lbs from last december Lab Results  Component Value Date   HGBA1C 6.8 (H) 01/28/2020   HGBA1C 6.2 (A) 09/08/2019   HGBA1C 5.9 (A) 03/31/2019   A/P: Previously well controlled-update A1c with labs today.  Hopefully continue current medication  #Hypertension S: controlled on nadolol 20 mg daily, and on quinapril 5 mg at bedtime(found to have a fungal lip issue and was not angioedema) BP Readings from Last 3 Encounters:  03/06/21 110/66  01/24/21 103/72  12/20/20 126/64  A/P:  Stable. Continue current medications.    Depression  S: Compliant with Lexapro 20 mg daily and trazodone 50 mg  as needed.  For sleep -Loss son to suicide in October 2018 contributed. Depression screen [facility] 2/9 03/06/2021 08/02/2020 11/26/2019  Decreased Interest 0 0 0  Down, Depressed, Hopeless 0 0 0  PHQ - 2 Score 0 0 0  Altered sleeping 0 0 0  Tired, decreased energy 0 0 0  Change in appetite 0 0 0  Feeling bad or failure about yourself  0 0 0  Trouble concentrating 0 0 0  Moving slowly or fidgety/restless  0 0 0  Suicidal thoughts 0 0 0  PHQ-9 Score 0 0 0  Difficult doing work/chores Not difficult at all - Not difficult at all  Some recent data might be hidden  /P: Well-controlled/full remission-continue current medicines. Discussed trial 10 mg but she declines for now  #Hyperlipidemia S: Compliant with Zetia 10 mg daily and Crestor 10 mg daily- adjusted meds last year Lab Results  Component Value Date   CHOL 112 09/28/2019   HDL 40.80 09/28/2019   LDLCALC 36 09/26/2018   LDLDIRECT 40.0 09/28/2019   TRIG 342.0 (H) 09/28/2019   CHOLHDL 3 09/28/2019   A/P: Well-controlled on last check-update lipid panel with labs today  #Hypothyroidism S: Compliant with levothyroxine 100 mcg daily  Lab Results  Component Value Date   TSH 0.29 (L) 01/28/2020   A/P: hopefully stable- update  tsh today. Continue current meds for now    Recommended follow up: Return in about 6 months (around 09/06/2021) for follow-up or sooner as needed. Future Appointments  Date Time Provider [place]  03/23/2021  2:45 PM [place], Romilda Garret, DPM [facility]   Lab/Order associations:   ICD-10-CM   1. Type II diabetes mellitus with neurological manifestations (HCC)  E11.49 Comprehensive metabolic panel    Lipid panel    Hemoglobin A1c    POCT Urinalysis Dipstick (Automated)    2. Major depressive disorder with single episode, in full remission ([place])  F32.5     3. Hypothyroidism, unspecified type  E03.9 TSH    4. Hyperlipidemia associated with type 2 diabetes mellitus (HCC)  E11.69 CBC with Differential/Platelet   E78.5 Comprehensive metabolic panel    Lipid panel    POCT Urinalysis Dipstick (Automated)    5. Essential hypertension  I10 POCT Urinalysis Dipstick (Automated)    6. High risk medication use  Z79.899 B12      Meds ordered this encounter  Medications   cetirizine (ZYRTEC) 5 MG tablet    Sig: Take 1 tablet (5 mg total) by mouth daily.    Dispense:  90 tablet    Refill:  3   I,Jada  Bradford,acting as a scribe for Garret Reddish, MD.,have documented all relevant documentation on the behalf of Garret Reddish, MD,as directed by  Garret Reddish, MD while in the presence of Garret Reddish, MD.  I, Garret Reddish, MD, have reviewed all documentation for this visit. The documentation on 03/06/21 for the exam, diagnosis, procedures, and orders are all accurate and complete.  Return precautions advised.  Garret Reddish, MD

## 2021-03-06 ENCOUNTER — Encounter: Payer: Self-pay | Admitting: Family Medicine

## 2021-03-06 ENCOUNTER — Ambulatory Visit (INDEPENDENT_AMBULATORY_CARE_PROVIDER_SITE_OTHER): Payer: Medicare Other | Admitting: Family Medicine

## 2021-03-06 ENCOUNTER — Other Ambulatory Visit: Payer: Self-pay

## 2021-03-06 VITALS — BP 110/66 | HR 60 | Temp 98.6°F | Ht 68.0 in | Wt 168.0 lb

## 2021-03-06 DIAGNOSIS — E785 Hyperlipidemia, unspecified: Secondary | ICD-10-CM | POA: Diagnosis not present

## 2021-03-06 DIAGNOSIS — I10 Essential (primary) hypertension: Secondary | ICD-10-CM

## 2021-03-06 DIAGNOSIS — F325 Major depressive disorder, single episode, in full remission: Secondary | ICD-10-CM | POA: Diagnosis not present

## 2021-03-06 DIAGNOSIS — E1149 Type 2 diabetes mellitus with other diabetic neurological complication: Secondary | ICD-10-CM

## 2021-03-06 DIAGNOSIS — E039 Hypothyroidism, unspecified: Secondary | ICD-10-CM | POA: Diagnosis not present

## 2021-03-06 DIAGNOSIS — E1142 Type 2 diabetes mellitus with diabetic polyneuropathy: Secondary | ICD-10-CM

## 2021-03-06 DIAGNOSIS — Z79899 Other long term (current) drug therapy: Secondary | ICD-10-CM | POA: Diagnosis not present

## 2021-03-06 DIAGNOSIS — E1169 Type 2 diabetes mellitus with other specified complication: Secondary | ICD-10-CM | POA: Diagnosis not present

## 2021-03-06 LAB — COMPREHENSIVE METABOLIC PANEL
ALT: 17 U/L (ref 0–35)
AST: 21 U/L (ref 0–37)
Albumin: 4.2 g/dL (ref 3.5–5.2)
Alkaline Phosphatase: 45 U/L (ref 39–117)
BUN: 18 mg/dL (ref 6–23)
CO2: 26 mEq/L (ref 19–32)
Calcium: 9.8 mg/dL (ref 8.4–10.5)
Chloride: 99 mEq/L (ref 96–112)
Creatinine, Ser: 0.85 mg/dL (ref 0.40–1.20)
GFR: 63.85 mL/min (ref >60.00)
Glucose, Bld: 113 mg/dL — ABNORMAL HIGH (ref 70–99)
Potassium: 3.9 mEq/L (ref 3.5–5.1)
Sodium: 139 mEq/L (ref 135–145)
Total Bilirubin: 1.4 mg/dL — ABNORMAL HIGH (ref 0.2–1.2)
Total Protein: 6.5 g/dL (ref 6.0–8.3)

## 2021-03-06 LAB — CBC WITH DIFFERENTIAL/PLATELET
Basophils Absolute: 0 10*3/uL (ref 0.0–0.1)
Basophils Relative: 0.7 % (ref 0.0–3.0)
Eosinophils Absolute: 0.2 10*3/uL (ref 0.0–0.7)
Eosinophils Relative: 2.4 % (ref 0.0–5.0)
HCT: 43.5 % (ref 36.0–46.0)
Hemoglobin: 14 g/dL (ref 12.0–15.0)
Lymphocytes Relative: 17.2 % (ref 12.0–46.0)
Lymphs Abs: 1.2 10*3/uL (ref 0.7–4.0)
MCHC: 32.2 g/dL (ref 30.0–36.0)
MCV: 95.7 fl (ref 78.0–100.0)
Monocytes Absolute: 0.7 10*3/uL (ref 0.1–1.0)
Monocytes Relative: 9.7 % (ref 3.0–12.0)
Neutro Abs: 4.8 10*3/uL (ref 1.4–7.7)
Neutrophils Relative %: 70 % (ref 43.0–77.0)
Platelets: 167 10*3/uL (ref 150.0–400.0)
RBC: 4.55 Mil/uL (ref 3.87–5.11)
RDW: 13.7 % (ref 11.5–15.5)
WBC: 6.9 10*3/uL (ref 4.0–10.5)

## 2021-03-06 LAB — LIPID PANEL
Cholesterol: 91 mg/dL (ref 0–200)
HDL: 37.7 mg/dL — ABNORMAL LOW (ref >39.00)
LDL Cholesterol: 23 mg/dL (ref 0–99)
NonHDL: 52.83
Total CHOL/HDL Ratio: 2
Triglycerides: 148 mg/dL (ref 0.0–149.0)
VLDL: 29.6 mg/dL (ref 0.0–40.0)

## 2021-03-06 LAB — POC URINALSYSI DIPSTICK (AUTOMATED)
Bilirubin, UA: NEGATIVE
Blood, UA: NEGATIVE
Glucose, UA: NEGATIVE
Ketones, UA: NEGATIVE
Leukocytes, UA: NEGATIVE
Nitrite, UA: NEGATIVE
Protein, UA: NEGATIVE
Spec Grav, UA: 1.02 (ref 1.010–1.025)
Urobilinogen, UA: 0.2 E.U./dL
pH, UA: 6 (ref 5.0–8.0)

## 2021-03-06 LAB — HEMOGLOBIN A1C: Hgb A1c MFr Bld: 6.4 % (ref 4.6–6.5)

## 2021-03-06 LAB — VITAMIN B12: Vitamin B-12: 388 pg/mL (ref 211–911)

## 2021-03-06 LAB — TSH: TSH: 0.15 u[IU]/mL — ABNORMAL LOW (ref 0.35–5.50)

## 2021-03-06 MED ORDER — CETIRIZINE HCL 5 MG PO TABS: 5.0000 mg | ORAL_TABLET | Freq: Every day | ORAL | 3 refills | Status: DC

## 2021-03-06 NOTE — Patient Instructions (Addendum)
Health Maintenance Due  Topic Date Due   Zoster Vaccines- Shingrix  - Please check with your pharmacy to see if they have the shingrix vaccine. If they do- please get this immunization and update [place] by phone call or mychart with dates you receive the vaccine  Never done   COVID-19 Vaccine (4 - Booster for [employer] series)   - Preferred to wait for new various COVID vaccination.  08/11/2020   INFLUENZA VACCINE Not available in office yet.   - Please consider getting your flu shot in the Fall. If you get this outside of our office, please let us know.  02/20/2021   Please stop by lab before you go If you have mychart- we will send your results within 3 business days of [place] receiving them.  If you do not have mychart- we will call you about results within 5 business days of [place] receiving them.  *please also note that you will see labs on mychart as soon as they post. I will later go in and write notes on them- will say "notes from Dr. Yong Channel"  Recommended follow up: Return in about 6 months (around 09/06/2021) for follow-up or sooner as needed.

## 2021-03-06 NOTE — Addendum Note (Signed)
Addended by: Loura Back on: 03/06/2021 05:04 PM   Modules accepted: Orders

## 2021-03-07 ENCOUNTER — Other Ambulatory Visit: Payer: Self-pay | Admitting: Family Medicine

## 2021-03-07 ENCOUNTER — Encounter: Payer: Self-pay | Admitting: Family Medicine

## 2021-03-07 ENCOUNTER — Other Ambulatory Visit: Payer: Self-pay

## 2021-03-07 DIAGNOSIS — E039 Hypothyroidism, unspecified: Secondary | ICD-10-CM

## 2021-03-07 MED ORDER — LEVOTHYROXINE SODIUM 88 MCG PO TABS: 88.0000 ug | ORAL_TABLET | Freq: Every day | ORAL | 3 refills | Status: DC

## 2021-03-17 ENCOUNTER — Other Ambulatory Visit: Payer: Self-pay | Admitting: Family Medicine

## 2021-03-20 ENCOUNTER — Telehealth: Payer: Self-pay | Admitting: Podiatry

## 2021-03-20 NOTE — Telephone Encounter (Signed)
Pt is calling about her toenail fungus results, she is trying to see of she need to cx her appointment for Thursday 09/01.

## 2021-03-21 ENCOUNTER — Telehealth: Payer: Self-pay | Admitting: *Deleted

## 2021-03-21 NOTE — Telephone Encounter (Signed)
Called [facility] and they will fax nail fungal results.

## 2021-03-22 ENCOUNTER — Telehealth: Payer: Self-pay | Admitting: *Deleted

## 2021-03-22 ENCOUNTER — Encounter: Payer: Self-pay | Admitting: *Deleted

## 2021-03-22 NOTE — Telephone Encounter (Signed)
[facility] results have been scanned into epic-03/22/21

## 2021-03-22 NOTE — Telephone Encounter (Signed)
Called [facility] , are faxing over report, failed to send 1 day ago, will refax today.

## 2021-03-23 ENCOUNTER — Other Ambulatory Visit: Payer: Self-pay

## 2021-03-23 ENCOUNTER — Ambulatory Visit (INDEPENDENT_AMBULATORY_CARE_PROVIDER_SITE_OTHER): Payer: Medicare Other | Admitting: Podiatry

## 2021-03-23 DIAGNOSIS — B351 Tinea unguium: Secondary | ICD-10-CM | POA: Diagnosis not present

## 2021-03-23 DIAGNOSIS — L603 Nail dystrophy: Secondary | ICD-10-CM

## 2021-03-25 NOTE — Progress Notes (Signed)
She presents today for follow-up of her nail pathology..  We had notified her prior to her visit today that the findings were inconclusive.  Objective: Findings were consistent with Sapper fights however there was no identification and was stated as indeterminate.  Assessment: Probable nail dystrophy possible saprophytic infection possible dermatophytic infection.  Plan: Resampled the nails today to be sent for pathologic evaluation.  We will follow-up with her in 1 month.

## 2021-04-06 ENCOUNTER — Encounter: Payer: Self-pay | Admitting: Podiatry

## 2021-04-18 ENCOUNTER — Other Ambulatory Visit (INDEPENDENT_AMBULATORY_CARE_PROVIDER_SITE_OTHER): Payer: Medicare Other

## 2021-04-18 ENCOUNTER — Other Ambulatory Visit: Payer: Self-pay

## 2021-04-18 DIAGNOSIS — E039 Hypothyroidism, unspecified: Secondary | ICD-10-CM

## 2021-04-19 ENCOUNTER — Ambulatory Visit (INDEPENDENT_AMBULATORY_CARE_PROVIDER_SITE_OTHER): Payer: Medicare Other | Admitting: Cardiology

## 2021-04-19 ENCOUNTER — Encounter: Payer: Self-pay | Admitting: Cardiology

## 2021-04-19 DIAGNOSIS — Z0000 Encounter for general adult medical examination without abnormal findings: Secondary | ICD-10-CM | POA: Diagnosis not present

## 2021-04-19 LAB — TSH: TSH: 0.6 u[IU]/mL (ref 0.35–5.50)

## 2021-04-19 NOTE — Patient Instructions (Signed)
Health Maintenance, Female Adopting a healthy lifestyle and getting preventive care are important in promoting health and wellness. Ask your health care provider about: The right schedule for you to have regular tests and exams. Things you can do on your own to prevent diseases and keep yourself healthy. What should I know about diet, weight, and exercise? Eat a healthy diet  Eat a diet that includes plenty of vegetables, fruits, low-fat dairy products, and lean protein. Do not eat a lot of foods that are high in solid fats, added sugars, or sodium. Maintain a healthy weight Body mass index (BMI) is used to identify weight problems. It estimates body fat based on height and weight. Your health care provider can help determine your BMI and help you achieve or maintain a healthy weight. Get regular exercise Get regular exercise. This is one of the most important things you can do for your health. Most adults should: Exercise for at least 150 minutes each week. The exercise should increase your heart rate and make you sweat (moderate-intensity exercise). Do strengthening exercises at least twice a week. This is in addition to the moderate-intensity exercise. Spend less time sitting. Even light physical activity can be beneficial. Watch cholesterol and blood lipids Have your blood tested for lipids and cholesterol at 82 years of age, then have this test every 5 years. Have your cholesterol levels checked more often if: Your lipid or cholesterol levels are high. You are older than 82 years of age. You are at high risk for heart disease. What should I know about cancer screening? Depending on your health history and family history, you may need to have cancer screening at various ages. This may include screening for: Breast cancer. Cervical cancer. Colorectal cancer. Skin cancer. Lung cancer. What should I know about heart disease, diabetes, and high blood pressure? Blood pressure and heart  disease High blood pressure causes heart disease and increases the risk of stroke. This is more likely to develop in people who have high blood pressure readings, are of African descent, or are overweight. Have your blood pressure checked: Every 3-5 years if you are 18-39 years of age. Every year if you are 40 years old or older. Diabetes Have regular diabetes screenings. This checks your fasting blood sugar level. Have the screening done: Once every three years after age 40 if you are at a normal weight and have a low risk for diabetes. More often and at a younger age if you are overweight or have a high risk for diabetes. What should I know about preventing infection? Hepatitis B If you have a higher risk for hepatitis B, you should be screened for this virus. Talk with your health care provider to find out if you are at risk for hepatitis B infection. Hepatitis C Testing is recommended for: Everyone born from 1945 through 1965. Anyone with known risk factors for hepatitis C. Sexually transmitted infections (STIs) Get screened for STIs, including gonorrhea and chlamydia, if: You are sexually active and are younger than 82 years of age. You are older than 82 years of age and your health care provider tells you that you are at risk for this type of infection. Your sexual activity has changed since you were last screened, and you are at increased risk for chlamydia or gonorrhea. Ask your health care provider if you are at risk. Ask your health care provider about whether you are at high risk for HIV. Your health care provider may recommend a prescription medicine   to help prevent HIV infection. If you choose to take medicine to prevent HIV, you should first get tested for HIV. You should then be tested every 3 months for as long as you are taking the medicine. Pregnancy If you are about to stop having your period (premenopausal) and you may become pregnant, seek counseling before you get  pregnant. Take 400 to 800 micrograms (mcg) of folic acid every day if you become pregnant. Ask for birth control (contraception) if you want to prevent pregnancy. Osteoporosis and menopause Osteoporosis is a disease in which the bones lose minerals and strength with aging. This can result in bone fractures. If you are 65 years old or older, or if you are at risk for osteoporosis and fractures, ask your health care provider if you should: Be screened for bone loss. Take a calcium or vitamin D supplement to lower your risk of fractures. Be given hormone replacement therapy (HRT) to treat symptoms of menopause. Follow these instructions at home: Lifestyle Do not use any products that contain nicotine or tobacco, such as cigarettes, e-cigarettes, and chewing tobacco. If you need help quitting, ask your health care provider. Do not use street drugs. Do not share needles. Ask your health care provider for help if you need support or information about quitting drugs. Alcohol use Do not drink alcohol if: Your health care provider tells you not to drink. You are pregnant, may be pregnant, or are planning to become pregnant. If you drink alcohol: Limit how much you use to 0-1 drink a day. Limit intake if you are breastfeeding. Be aware of how much alcohol is in your drink. In the [place] one drink equals one 12 oz bottle of beer (355 mL), one 5 oz glass of wine (148 mL), or one 1 oz glass of hard liquor (44 mL). General instructions Schedule regular health, dental, and eye exams. Stay current with your vaccines. Tell your health care provider if: You often feel depressed. You have ever been abused or do not feel safe at home. Summary Adopting a healthy lifestyle and getting preventive care are important in promoting health and wellness. Follow your health care provider's instructions about healthy diet, exercising, and getting tested or screened for diseases. Follow your health care provider's  instructions on monitoring your cholesterol and blood pressure. This information is not intended to replace advice given to you by your health care provider. Make sure you discuss any questions you have with your health care provider. Document Revised: 09/16/2020 Document Reviewed: 07/02/2018 Elsevier Patient Education  2022 [employer]  

## 2021-04-19 NOTE — Progress Notes (Signed)
Subjective:   Kim Singh is a 80 y.o. female who presents for Medicare Annual (Subsequent) preventive examination.  I connected with  [name] on 04/19/21 by a  audio enabled telemedicine application and verified that I am speaking with the correct person using two identifiers.   I discussed the limitations of evaluation and management by telemedicine. The patient expressed understanding and agreed to proceed.   Location of Patient: Home Location of Provider: Office Persons participating in visit: Buffie Herne and Rennis Harding   Review of Systems    Defer to PCP  Cardiac Risk Factors include: advanced age (>93mn, >>96women);dyslipidemia;hypertension     Objective:    There were no vitals filed for this visit. There is no height or weight on file to calculate BMI.  Advanced Directives 04/19/2021 09/28/2019 09/03/2018 04/23/2017 07/22/2016 05/08/2016  Does Patient Have a Medical Advance Directive? Yes Yes Yes Yes Yes Yes  Type of AParamed[place]of [place]ansom [place] will Living will;Healthcare Power of AUnion CenterLiving will HSienna [place]ationLiving will [place]-  Does patient want to make changes to medical advance directive? No - Patient declined No - Patient declined No - Patient declined Yes (MAU/Ambulatory/Procedural Areas - Information given) - -  Copy of HEriein Chart? Yes - validated most recent copy scanned in chart (See row information) No - copy requested No - copy requested No - copy requested No - copy requested -  Would patient like information on creating a medical advance directive? - - - - No - Patient declined -    Current Medications (verified) Outpatient Encounter Medications as of 04/19/2021  Medication Sig   CALCIUM-VITAMIN D PO Take 1 tablet by mouth 2 (two) times daily.    cetirizine (ZYRTEC) 5 MG tablet Take 1 tablet (5 mg total) by mouth daily.    escitalopram (LEXAPRO) 20 MG tablet TAKE 1 TABLET DAILY   ezetimibe (ZETIA) 10 MG tablet Take 1 tablet (10 mg total) by mouth daily. Need visit to get refills   fluticasone (FLONASE) 50 MCG/ACT nasal spray Place 2 sprays into both nostrils daily. (Patient taking differently: Place 2 sprays into both nostrils as needed.)   levothyroxine (SYNTHROID) 88 MCG tablet Take 1 tablet (88 mcg total) by mouth daily.   metFORMIN (GLUCOPHAGE) 1000 MG tablet TAKE 1 TABLET DAILY WITH BREAKFAST   Multiple Vitamin (MULTIVITAMIN) tablet Take 1 tablet by mouth daily.   Multiple Vitamins-Minerals (ICAPS LUTEIN & ZEAXANTH, MVI, PO) Take by mouth daily.   nadolol (CORGARD) 20 MG tablet TAKE 1 TABLET DAILY   nystatin ointment (MYCOSTATIN) as needed.   Omega-3-acid Ethyl Esters (OMACOR PO) Take 1 tablet by mouth 2 (two) times daily.    quinapril (ACCUPRIL) 5 MG tablet TAKE 1 TABLET AT BEDTIME   rosuvastatin (CRESTOR) 10 MG tablet TAKE 1 TABLET DAILY   traZODone (DESYREL) 50 MG tablet TAKE ONE-HALF (1/2) TABLET AT BEDTIME AS NEEDED FOR SLEEP   vitamin B-12 (CYANOCOBALAMIN) 1000 MCG tablet Take 1,000 mcg by mouth once a week. Takes on Mondays   No facility-administered encounter medications on file as of 04/19/2021.    Allergies (verified) Codeine   History: Past Medical History:  Diagnosis Date   Breast cancer ([place] 2001   Left   Diabetes mellitus type 2, uncomplicated (HC[place]    Personal history of chemotherapy    Personal history of radiation therapy    Pure hypercholesterolemia    Unspecified disorder of  thyroid    Unspecified essential hypertension    Past Surgical History:  Procedure Laterality Date   BREAST EXCISIONAL BIOPSY Left    BREAST LUMPECTOMY Left 2001   THYROIDECTOMY     total   Family History  Problem Relation Age of Onset   Diabetes Mother    Hearing loss Father    Cancer Sister        Bile Duct Cancer   Depression Son    Social History   Socioeconomic History   Marital  status: Widowed    Spouse name: Not on file   Number of children: Not on file   Years of education: Not on file   Highest education level: Not on file  Occupational History   Occupation: Retired  Tobacco Use   Smoking status: Former    Packs/day: 1.00    Years: 41.00    Pack years: 41.00    Types: Cigarettes    Quit date: 08/02/1997    Years since quitting: 23.7   Smokeless tobacco: Never  Vaping Use   Vaping Use: Never used  Substance and Sexual Activity   Alcohol use: Yes    Alcohol/week: 7.0 standard drinks    Types: 7 Glasses of wine per week    Comment: glass of wine every day   Drug use: No   Sexual activity: Not Currently  Other Topics Concern   Not on file  Social History Narrative   Widowed 2008. 3 children. 4 grandkids. No greatgrandkids.    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       Hobbies: in [place] had a widowed group, here joined a church with kids, Psychologist, occupational work. Looking for friends network   Social Determinants of Health   Financial Resource Strain: Low Risk    Difficulty of Paying Living Expenses: Not hard at all  Food Insecurity: No Food Insecurity   Worried About Charity fundraiser in the Last Year: Never true   Arboriculturist in the Last Year: Never true  Transportation Needs: No Transportation Needs   Lack of Transportation (Medical): No   Lack of Transportation (Non-Medical): No  Physical Activity: Inactive   Days of Exercise per Week: 0 days   Minutes of Exercise per Session: 0 min  Stress: No Stress Concern Present   Feeling of Stress : Not at all  Social Connections: Moderately Isolated   Frequency of Communication with Friends and Family: More than three times a week   Frequency of Social Gatherings with Friends and Family: More than three times a week   Attends Religious Services: More than 4  times per year   Active Member of [employer] or Organizations: No   Attends Archivist Meetings: Never   Marital Status: Widowed    Tobacco Counseling Counseling given: Not Answered   Clinical Intake:  Pre-visit preparation completed: Yes  Pain : No/denies pain     Nutritional Risks: None Diabetes: Yes CBG done?: No Did pt. bring in CBG monitor from home?: No  How often do you need to have someone help you when you read instructions, pamphlets, or other written materials from your doctor or pharmacy?: 1 - Never What is the last grade level you completed in school?: 12 years  Diabetic?YES  Interpreter Needed?: No  Information entered by :: Rennis Harding, RN  Activities of Daily Living In your present state of health, do you have any difficulty performing the following activities: 04/19/2021 03/06/2021  Hearing? N N  Vision? N N  Difficulty concentrating or making decisions? N N  Walking or climbing stairs? N N  Dressing or bathing? N N  Doing errands, shopping? N N  Preparing Food and eating ? N -  In the past six months, have you accidently leaked urine? N -  Do you have problems with loss of bowel control? N -  Managing your Medications? N -  Managing your Finances? N -  Housekeeping or managing your Housekeeping? N -  Some recent data might be hidden    Patient Care Team: Marin Olp, MD as PCP - General (Family Medicine) Camillo Flaming, Mellen as Consulting Physician (Optometry) Rozetta Nunnery, MD as Consulting Physician (Otolaryngology)  Indicate any recent Medical Services you may have received from other than [facility] providers in the past year (date may be approximate).     Assessment:   This is a routine wellness examination for [place].  Hearing/Vision screen No results found.  Dietary issues and exercise activities discussed: Current Exercise Habits: The patient does not participate in regular exercise at present, Exercise limited  by: neurologic condition(s) (neurophathy)   Goals Addressed   None   Depression Screen PHQ 2/9 Scores 04/19/2021 03/06/2021 08/02/2020 11/26/2019 09/28/2019 09/28/2019 09/08/2019  PHQ - 2 Score 0 0 0 0 0 0 0  PHQ- 9 Score 0 0 0 0 1 1 1     Fall Risk Fall Risk  04/19/2021 03/06/2021 09/28/2019 09/26/2018 09/03/2018  Falls in the past year? 0 0 0 0 1  Comment - - - - Slipped down the stairs  Number falls in past yr: 0 0 0 - 0  Comment - - - - -  Injury with Fall? 0 0 0 - 0  Risk for fall due to : No Fall Risks No Fall Risks - - -  Follow up - Falls evaluation completed - - -    FALL RISK PREVENTION PERTAINING TO THE HOME:  Any stairs in or around the home? Yes  If so, are there any without handrails? Yes  Home free of loose throw rugs in walkways, pet beds, electrical cords, etc? Yes  Adequate lighting in your home to reduce risk of falls? Yes   ASSISTIVE DEVICES UTILIZED TO PREVENT FALLS:  Life alert? No  Use of a cane, walker or w/c? No  Grab bars in the bathroom? No  Shower chair or bench in shower? Yes  Elevated toilet seat or a handicapped toilet?  Yes  TIMED UP AND GO:  Was the test performed? No .  Length of time to ambulate 10 feet: N/A sec.     Cognitive Function: MMSE - Mini Mental State Exam 09/03/2018 05/08/2016  Not completed: - (No Data)  Orientation to time 5 -  Orientation to Place 5 -  Registration 3 -  Attention/ Calculation 5 -  Recall 3 -  Language- name 2 objects 2 -  Language- repeat 1 -  Language- follow 3 step command 3 -  Language- read & follow direction 1 -  Write a sentence 1 -  Copy design 1 -  Total score 30 -     6CIT Screen 04/19/2021 09/28/2019  What Year? 0 points 0 points  What month? 0 points 0 points  What time? 0 points 0 points  Count back from 20 0 points 0 points  Months in reverse 0 points 0 points  Repeat phrase 0 points 0 points  Total Score 0 0    Immunizations Immunization History  Administered Date(s) Administered   Fluad  Quad(high Dose 65+) 03/31/2019   Influenza, High Dose Seasonal PF 06/28/2014, 04/06/2016, 05/13/2017, 09/03/2018   PFIZER(Purple Top)SARS-COV-2 Vaccination 09/12/2019, 10/06/2019, 05/11/2020   Pneumococcal Conjugate-13 06/28/2014, 04/17/2016   Pneumococcal Polysaccharide-23 08/22/2015   Tdap 04/17/2016    TDAP status: Up to date  Flu Vaccine status: Due, Education has been provided regarding the importance of this vaccine. Advised may receive this vaccine at local pharmacy or Health Dept. Aware to provide a copy of the vaccination record if obtained from local pharmacy or Health Dept. Verbalized acceptance and understanding.  Pneumococcal vaccine status: Due, Education has been provided regarding the importance of this vaccine. Advised may receive this vaccine at local pharmacy or Health Dept. Aware to provide a copy of the vaccination record if obtained from local pharmacy or Health Dept. Verbalized acceptance and understanding.  Covid-19 vaccine status: Completed vaccines  Qualifies for Shingles Vaccine? Yes   Zostavax completed No   Shingrix Completed?: No.    Education has been provided regarding the importance of this vaccine. Patient has been advised to call insurance company to determine out of pocket expense if they have not yet received this vaccine. Advised may also receive vaccine at local pharmacy or Health Dept. Verbalized acceptance and understanding.  Screening Tests Health Maintenance  Topic Date Due   Zoster Vaccines- Shingrix (1 of 2) Never done   COVID-19 Vaccine (4 - Booster for Pfizer series) 08/03/2020   INFLUENZA VACCINE  02/20/2021   OPHTHALMOLOGY EXAM  05/02/2021   HEMOGLOBIN A1C  09/06/2021   FOOT EXAM  03/06/2022   TETANUS/TDAP  04/17/2026   DEXA SCAN  Completed   HPV VACCINES  Aged Out    Health Maintenance  Health Maintenance Due  Topic Date Due   Zoster Vaccines- Shingrix (1 of 2) Never done   COVID-19 Vaccine (4 - Booster for Pfizer series)  08/03/2020   INFLUENZA VACCINE  02/20/2021    Colorectal cancer screening: Type of screening: Colonoscopy. Completed 2013 ?Marland Kitchen Repeat every ? years  Mammogram status: No longer required due to age.  Bone Density status: Completed 2013. Results reflect: Bone density results: NORMAL. Repeat every 3 years.  Lung Cancer Screening: (Low Dose CT Chest recommended if Age 33-80 years, 30 pack-year currently smoking OR have quit w/in 15years.) does not qualify.   Lung Cancer Screening Referral: N/A  Additional Screening:  Hepatitis C Screening: does not qualify; Completed   Vision Screening: Recommended annual ophthalmology exams for early detection of glaucoma and other disorders of the eye. Is the patient up to date with their annual eye exam?  Yes  Who is the provider or what is the name of the office in which the patient attends annual eye exams? [facility] If pt is not established with a provider, would they like to be referred to a provider to establish care? No .   Dental Screening: Recommended annual dental exams for proper oral hygiene  Community Resource Referral / Chronic Care Management: CRR required this visit?  No   CCM required this visit?  No      Plan:     I have personally reviewed and noted the following in the patient's chart:   Medical and social history Use of alcohol, tobacco or illicit drugs  Current medications and supplements including opioid prescriptions.  Functional ability  and status Nutritional status Physical activity Advanced directives List of other physicians Hospitalizations, surgeries, and ER visits in previous 12 months Vitals Screenings to include cognitive, depression, and falls Referrals and appointments  In addition, I have reviewed and discussed with patient certain preventive protocols, quality metrics, and best practice recommendations. A written personalized care plan for preventive services as well as general preventive health  recommendations were provided to patient.  Patient states she is going to send my chart message to Dr Yong Channel regarding the following items: Omicron vaccince vs 4th shot/booster Mammogram  Shingles vaccine Pneumonia vaccine Colonoscopy Bone density svan     Rennis Harding, RN   04/19/2021   Nurse Notes:   Non-Face to Face 40 minute visit   Ms. Casebolt , Thank you for taking time to come for your Medicare Wellness Visit. I appreciate your ongoing commitment to your health goals. Please review the following plan we discussed and let me know if I can assist you in the future.   These are the goals we discussed:  Goals      Exercise 150 minutes per week (moderate activity)     Will continue with PT  Will develop a personal plan of exercise that meets your needs;  Older adults aged 55 or older, who are generally fit and have no health conditions that limit their mobility, should try to be active daily and should do: at least 150 minutes of moderate aerobic activity such as cycling or walking every week, and  strength exercises on two or more days a week that work all the major muscles (legs, hips, back, abdomen, chest, shoulders and arms).      OR  75 minutes of vigorous aerobic activity such as running or a game of singles tennis every week, and  strength exercises on two or more days a week that work all the major muscles (legs, hips, back, abdomen, chest, shoulders and arms).     OR  a mix of moderate and vigorous aerobic activity every week. For example, two 30-minute runs, plus 30 minutes of fast walking, equates to 150 minutes of moderate aerobic activity, and  strength exercises on two or more days a week that work all the major muscles (legs, hips, back, abdomen, chest, shoulders and arms).  A rule of thumb is that one minute of vigorous activity provides the same health benefits as two minutes of moderate activity. You should also try to break up long periods of sitting with  light activity, as sedentary behaviour is now considered an independent risk factor for ill health, no matter how much exercise you do. Find out why sitting is bad for your health. Older adults at risk of falls, such as people with weak legs, poor balance and some medical conditions, should do exercises to improve balance and co-ordination on at least two days a week. Examples include yoga, tai chi and dancing.       maintain current health status        This is a list of the screening recommended for you and due dates:  Health Maintenance  Topic Date Due   Zoster (Shingles) Vaccine (1 of 2) Never done   COVID-19 Vaccine (4 - Booster for Pfizer series) 08/03/2020   Flu Shot  02/20/2021   Eye exam for diabetics  05/02/2021   Hemoglobin A1C  09/06/2021   Complete foot exam   03/06/2022   Tetanus Vaccine  04/17/2026   DEXA scan (bone density measurement)  Completed  HPV Vaccine  Aged Out

## 2021-04-20 ENCOUNTER — Other Ambulatory Visit: Payer: Self-pay | Admitting: Family Medicine

## 2021-05-11 ENCOUNTER — Ambulatory Visit (INDEPENDENT_AMBULATORY_CARE_PROVIDER_SITE_OTHER): Payer: Medicare Other | Admitting: Podiatry

## 2021-05-11 ENCOUNTER — Encounter: Payer: Self-pay | Admitting: Podiatry

## 2021-05-11 ENCOUNTER — Other Ambulatory Visit: Payer: Self-pay

## 2021-05-11 DIAGNOSIS — L603 Nail dystrophy: Secondary | ICD-10-CM | POA: Diagnosis not present

## 2021-05-11 NOTE — Progress Notes (Signed)
She presents today for follow-up of her pathology results regarding her toenails.  We discussed this in great detail today she understands this and is amendable to it.  Objective: Vital signs are stable she is alert oriented x3 pathology does demonstrate onychomycosis.  Assessment: Onychomycosis.  Plan: At this point she would like to just do nothing we did discuss topical therapy oral therapy and laser therapy at this point she has decided she would just live with what she has until it started to get worse if he got worse she will notify us and let us know.

## 2021-06-06 ENCOUNTER — Encounter: Payer: Self-pay | Admitting: Family Medicine

## 2021-06-06 ENCOUNTER — Ambulatory Visit (INDEPENDENT_AMBULATORY_CARE_PROVIDER_SITE_OTHER): Payer: Medicare Other | Admitting: Family Medicine

## 2021-06-06 ENCOUNTER — Other Ambulatory Visit: Payer: Self-pay

## 2021-06-06 VITALS — BP 118/70 | HR 87 | Temp 97.7°F | Ht 68.0 in | Wt 164.6 lb

## 2021-06-06 DIAGNOSIS — I10 Essential (primary) hypertension: Secondary | ICD-10-CM

## 2021-06-06 DIAGNOSIS — Z23 Encounter for immunization: Secondary | ICD-10-CM

## 2021-06-06 DIAGNOSIS — E785 Hyperlipidemia, unspecified: Secondary | ICD-10-CM | POA: Diagnosis not present

## 2021-06-06 DIAGNOSIS — E039 Hypothyroidism, unspecified: Secondary | ICD-10-CM | POA: Diagnosis not present

## 2021-06-06 DIAGNOSIS — E1169 Type 2 diabetes mellitus with other specified complication: Secondary | ICD-10-CM

## 2021-06-06 DIAGNOSIS — R197 Diarrhea, unspecified: Secondary | ICD-10-CM

## 2021-06-06 DIAGNOSIS — F325 Major depressive disorder, single episode, in full remission: Secondary | ICD-10-CM

## 2021-06-06 DIAGNOSIS — E1149 Type 2 diabetes mellitus with other diabetic neurological complication: Secondary | ICD-10-CM

## 2021-06-06 MED ORDER — METFORMIN HCL ER 750 MG PO TB24: 750.0000 mg | ORAL_TABLET | Freq: Every day | ORAL | 3 refills | Status: DC

## 2021-06-06 NOTE — Progress Notes (Signed)
Phone [phone] In person visit   Subjective:   Kim Singh is a 80 y.o. year old very pleasant female patient who presents for/with See problem oriented charting Chief Complaint  Patient presents with   Follow-up    Pt c/o diarrhea x1year and she would like to know what is going on, she does not have GI doc.    This visit occurred during the SARS-CoV-2 public health emergency.  Safety protocols were in place, including screening questions prior to the visit, additional usage of staff PPE, and extensive cleaning of exam room while observing appropriate contact time as indicated for disinfecting solutions.   Past Medical History-  Patient Active Problem List   Diagnosis Date Noted   Type II diabetes mellitus with neurological manifestations ([place]) 11/29/2013    Priority: High   Diabetic neuropathy ([place]) 08/22/2015    Priority: Medium    Depression 02/28/2015    Priority: Medium    Dyslipidemia 11/29/2013    Priority: Medium    Hypothyroidism 11/29/2013    Priority: Medium    Essential hypertension 11/29/2013    Priority: Medium    Former smoker 02/28/2015    Priority: Low   History of breast cancer 11/30/2013    Priority: Low   History of thyroid cancer 11/30/2013    Priority: Low   Orthostatic hypotension 11/29/2013    Priority: Low   Major depressive disorder with single episode, in full remission ([place]) 09/08/2019   Insomnia 12/19/2018   Grief reaction 05/16/2017    Medications- reviewed and updated Current Outpatient Medications  Medication Sig Dispense Refill   CALCIUM-VITAMIN D PO Take 1 tablet by mouth 2 (two) times daily.      cetirizine (ZYRTEC) 5 MG tablet Take 1 tablet (5 mg total) by mouth daily. 90 tablet 3   escitalopram (LEXAPRO) 20 MG tablet TAKE 1 TABLET DAILY 90 tablet 3   fluticasone (FLONASE) 50 MCG/ACT nasal spray Place 2 sprays into both nostrils daily. (Patient taking differently: Place 2 sprays into both nostrils as needed.) 16 g 11    levothyroxine (SYNTHROID) 88 MCG tablet Take 1 tablet (88 mcg total) by mouth daily. 90 tablet 3   metFORMIN (GLUCOPHAGE XR) 750 MG 24 hr tablet Take 1 tablet (750 mg total) by mouth daily with breakfast. 90 tablet 3   Multiple Vitamin (MULTIVITAMIN) tablet Take 1 tablet by mouth daily.     Multiple Vitamins-Minerals (ICAPS LUTEIN & ZEAXANTH, MVI, PO) Take by mouth daily.     nadolol (CORGARD) 20 MG tablet TAKE 1 TABLET DAILY 90 tablet 3   nystatin ointment (MYCOSTATIN) as needed.     Omega-3-acid Ethyl Esters (OMACOR PO) Take 1 tablet by mouth 2 (two) times daily.      quinapril (ACCUPRIL) 5 MG tablet TAKE 1 TABLET AT BEDTIME 90 tablet 3   rosuvastatin (CRESTOR) 10 MG tablet TAKE 1 TABLET DAILY 90 tablet 3   traZODone (DESYREL) 50 MG tablet TAKE ONE-HALF (1/2) TABLET AT BEDTIME AS NEEDED FOR SLEEP 45 tablet 3   vitamin B-12 (CYANOCOBALAMIN) 1000 MCG tablet Take 1,000 mcg by mouth once a week. Takes on Mondays     No current facility-administered medications for this visit.     Objective:  BP 118/70   Pulse 87   Temp 97.7 F (36.5 C)   Ht 5' 8"  (1.727 m)   Wt 164 lb 9.6 oz (74.7 kg)   SpO2 99%   BMI 25.03 kg/m  Gen: NAD, resting comfortably CV: RRR no murmurs rubs  or gallops Lungs: CTAB no crackles, wheeze, rhonchi Ext: minimal edema Skin: warm, dry    Assessment and Plan   #social update- met a gentleman at a party and they are dating and she is enjoying herself  for last 10 week. Not sexually active.   # Diarrhea episodes S: Started: several years ago- perhaps 7-8 years ago (has been to ER for this and seen GI when first started). Episodes occur about 6 weeks to a few months apart. She was not on metformin when they started. More frequent since moving to [place] but has also been on metformin more recently  Gets episodes of diarrhea that last the whole day similar to what she would have before colonoscopy. Takes tums several times doesn't help as far as she can tell. Uses depends  and cant go anywhere. Had to cancel some plans recently due to these.   Does do a hydration powder started a few years ago.   Stools per day: perhaps 5 The patient currently denies significant abdominal pain or discomfort.  Relation to food or medication: not that she is aware of Therapy tried so far: Tums ROS- No unusual headaches, no dizziness.  No black or bloody stools.   No mucus in stools.   A/P: 80 year old female with 78 years of diarrhea episodes every 6 to 12 weeks that seems to be more frequent over the last few years. - Has been trying to hydration powder-she will trial off of this to see if that is contributing but maintain her water intake - Is on metformin instant release and we will stop this at 1000 mg and trial metformin extended release 750 mg instead. - Can also try Imodium over-the-counter if has episodes of diarrhea. - if persistent issues can refer to GI or if red flags   #Diabetes with neuropathy S: Medication: on Metformin 1000 mg daily . Not on medicine for neuropathy- doesn't want to take anything Lab Results  Component Value Date   HGBA1C 6.4 03/06/2021   HGBA1C 6.8 (H) 01/28/2020   HGBA1C 6.2 (A) 09/08/2019  A/P: good control- see above stop metformin IR 1000 mg and add metformin XR 724m to see if helps with diarrhea   #Fatigue- wants to update labs and also gets some cool fingers- rule out anemia.   #Hypertension S: controlled on nadolol 20 mg, back on quinapril 5 mg (found to have a fungal lip issue and was not angioedema) BP Readings from Last 3 Encounters:  06/06/21 118/70  03/06/21 110/66  01/24/21 103/72  A/P: Controlled. Continue current medications.    Depression  S: Compliant with Lexapro 20 mg. Loss of son to suicide in October 2018 contributes. No worsening symptoms  A/P: full remission- offered to reduce dose- wants to hold off for now   #Hyperlipidemia S: Compliant with Zetia 10 mg and Crestor 10 mg daily Lab Results  Component  Value Date   CHOL 91 03/06/2021   HDL 37.70 (L) 03/06/2021   LDLCALC 23 03/06/2021   LDLDIRECT 40.0 09/28/2019   TRIG 148.0 03/06/2021   CHOLHDL 2 03/06/2021  A/P: very well controlled-  I think we can stop the zetia even- will recheck at next visit  #Hypothyroidism S: Compliant with levothyroxine 100 mcg daily Lab Results  Component Value Date   TSH 0.60 04/18/2021  A/P: recheck today with fatigue.    #Onychomycosis - patient was seen by Dr. [facility]ilinda [facility] 03/23/2021 and 05/11/2021 for nail dystrophy. Pathology showed that patient had onychomycosis of  the toenail. Patient also opted to not have any treatment including topical therapy, oral therapy, or laser therapy that was discussed  Recommended follow up: keep February vsiit Future Appointments  Date Time Provider [place]  09/05/2021  1:00 PM Marin Olp, MD [facility]    Lab/Order associations:   ICD-10-CM   1. Diarrhea, unspecified type  R19.7     2. Type II diabetes mellitus with neurological manifestations (HCC)  E11.49 CBC with Differential/Platelet    Comprehensive metabolic panel    3. Essential hypertension  I10 CBC with Differential/Platelet    Comprehensive metabolic panel    TSH    4. Need for immunization against influenza  Z23 Flu Vaccine QUAD High Dose(Fluad)    5. Major depressive disorder with single episode, in full remission ([place] )  F32.5     6. Hypothyroidism, unspecified type  E03.9     7. Hyperlipidemia associated with type 2 diabetes mellitus (HCC)  E11.69 CBC with Differential/Platelet   E78.5 Comprehensive metabolic panel    TSH      Meds ordered this encounter  Medications   metFORMIN (GLUCOPHAGE XR) 750 MG 24 hr tablet    Sig: Take 1 tablet (750 mg total) by mouth daily with breakfast.    Dispense:  90 tablet    Refill:  3   I,Harris Phan,acting as a scribe for Garret Reddish, MD.,have documented all relevant documentation on the behalf of Garret Reddish, MD,as  directed by  Garret Reddish, MD while in the presence of Garret Reddish, MD.  I, Garret Reddish, MD, have reviewed all documentation for this visit. The documentation on 06/06/21 for the exam, diagnosis, procedures, and orders are all accurate and complete.  Return precautions advised.  Garret Reddish, MD

## 2021-06-06 NOTE — Patient Instructions (Addendum)
Health Maintenance Due  Topic Date Due   Zoster Vaccines- Shingrix (1 of 2) - Please check with your pharmacy to see if they have the shingrix vaccine. If they do- please get this immunization and update [place] by phone call or mychart with dates you receive the vaccine  Never done   COVID-19 Vaccine (4 - Booster for [employer] series) - bivalent booster consider at pharmacy 07/06/2020   OPHTHALMOLOGY EXAM  - Sign release of information at the check out desk for last diabetic eye exam 05/02/2021   Trial Loperamide/Imodium over-the-counter if have diarrhea again  Trial off of hydration powder for perhaps 2-3 months to see if this makes a difference. Continue to stay well hydrated.  . Stop Metformin instant release and start Metformin 750 mg extended release.   May stop Zetia/Ezetimibe 10 mg. Recheck lipids/cholesterol next visit  Recommended follow up: Keep your February visit.

## 2021-06-07 LAB — COMPREHENSIVE METABOLIC PANEL
ALT: 18 U/L (ref 0–35)
AST: 21 U/L (ref 0–37)
Albumin: 4.3 g/dL (ref 3.5–5.2)
Alkaline Phosphatase: 57 U/L (ref 39–117)
BUN: 16 mg/dL (ref 6–23)
CO2: 29 mEq/L (ref 19–32)
Calcium: 9.9 mg/dL (ref 8.4–10.5)
Chloride: 99 mEq/L (ref 96–112)
Creatinine, Ser: 0.85 mg/dL (ref 0.40–1.20)
GFR: 63.73 mL/min (ref >60.00)
Glucose, Bld: 78 mg/dL (ref 70–99)
Potassium: 3.8 mEq/L (ref 3.5–5.1)
Sodium: 140 mEq/L (ref 135–145)
Total Bilirubin: 1.1 mg/dL (ref 0.2–1.2)
Total Protein: 6.8 g/dL (ref 6.0–8.3)

## 2021-06-07 LAB — CBC WITH DIFFERENTIAL/PLATELET
Basophils Absolute: 0.1 10*3/uL (ref 0.0–0.1)
Basophils Relative: 0.7 % (ref 0.0–3.0)
Eosinophils Absolute: 0.2 10*3/uL (ref 0.0–0.7)
Eosinophils Relative: 2.5 % (ref 0.0–5.0)
HCT: 42.8 % (ref 36.0–46.0)
Hemoglobin: 14.2 g/dL (ref 12.0–15.0)
Lymphocytes Relative: 21.4 % (ref 12.0–46.0)
Lymphs Abs: 1.7 10*3/uL (ref 0.7–4.0)
MCHC: 33.2 g/dL (ref 30.0–36.0)
MCV: 94.5 fl (ref 78.0–100.0)
Monocytes Absolute: 0.9 10*3/uL (ref 0.1–1.0)
Monocytes Relative: 10.7 % (ref 3.0–12.0)
Neutro Abs: 5.2 10*3/uL (ref 1.4–7.7)
Neutrophils Relative %: 64.7 % (ref 43.0–77.0)
Platelets: 197 10*3/uL (ref 150.0–400.0)
RBC: 4.53 Mil/uL (ref 3.87–5.11)
RDW: 13.8 % (ref 11.5–15.5)
WBC: 8 10*3/uL (ref 4.0–10.5)

## 2021-06-07 LAB — TSH: TSH: 1.48 u[IU]/mL (ref 0.35–5.50)

## 2021-08-21 ENCOUNTER — Other Ambulatory Visit: Payer: Self-pay | Admitting: Family Medicine

## 2021-08-30 NOTE — Progress Notes (Signed)
Phone [phone] In person visit   Subjective:   Kim Singh is a 81 y.o. year old very pleasant female patient who presents for/with See problem oriented charting Chief Complaint  Patient presents with   Follow-up    Pt would like to discuss metformin and quinapril that you took her off of.   Diabetes   Hypothyroidism   Hypertension   Depression   Hyperlipidemia   This visit occurred during the SARS-CoV-2 public health emergency.  Safety protocols were in place, including screening questions prior to the visit, additional usage of staff PPE, and extensive cleaning of exam room while observing appropriate contact time as indicated for disinfecting solutions.   Past Medical History-  Patient Active Problem List   Diagnosis Date Noted   Type II diabetes mellitus with neurological manifestations ([place]) 11/29/2013    Priority: High   Diabetic neuropathy ([place]) 08/22/2015    Priority: Medium    Depression 02/28/2015    Priority: Medium    Dyslipidemia 11/29/2013    Priority: Medium    Hypothyroidism 11/29/2013    Priority: Medium    Essential hypertension 11/29/2013    Priority: Medium    Former smoker 02/28/2015    Priority: Low   History of breast cancer 11/30/2013    Priority: Low   History of thyroid cancer 11/30/2013    Priority: Low   Orthostatic hypotension 11/29/2013    Priority: Low   Major depressive disorder with single episode, in full remission ([place]) 09/08/2019   Insomnia 12/19/2018   Grief reaction 05/16/2017    Medications- reviewed and updated Current Outpatient Medications  Medication Sig Dispense Refill   CALCIUM-VITAMIN D PO Take 1 tablet by mouth 2 (two) times daily.      cetirizine (ZYRTEC) 5 MG tablet Take 1 tablet (5 mg total) by mouth daily. 90 tablet 3   fluticasone (FLONASE) 50 MCG/ACT nasal spray Place 2 sprays into both nostrils daily. (Patient taking differently: Place 2 sprays into both nostrils as needed.) 16 g 11   levothyroxine  (SYNTHROID) 88 MCG tablet Take 1 tablet (88 mcg total) by mouth daily. 90 tablet 3   Multiple Vitamin (MULTIVITAMIN) tablet Take 1 tablet by mouth daily.     Multiple Vitamins-Minerals (ICAPS LUTEIN & ZEAXANTH, MVI, PO) Take by mouth daily.     nadolol (CORGARD) 20 MG tablet TAKE 1 TABLET DAILY 90 tablet 3   nystatin ointment (MYCOSTATIN) as needed.     Omega-3-acid Ethyl Esters (OMACOR PO) Take 1 tablet by mouth 2 (two) times daily.      rosuvastatin (CRESTOR) 10 MG tablet TAKE 1 TABLET DAILY 90 tablet 3   traZODone (DESYREL) 50 MG tablet TAKE ONE-HALF (1/2) TABLET AT BEDTIME AS NEEDED FOR SLEEP 45 tablet 3   vitamin B-12 (CYANOCOBALAMIN) 1000 MCG tablet Take 1,000 mcg by mouth once a week. Takes on Mondays     escitalopram (LEXAPRO) 10 MG tablet Take 1 tablet (10 mg total) by mouth daily. 90 tablet 3   No current facility-administered medications for this visit.     Objective:  BP 108/70    Pulse 66    Temp 97.9 F (36.6 C)    Ht 5' 8"  (1.727 m)    Wt 166 lb 9.6 oz (75.6 kg)    BMI 25.33 kg/m  Gen: NAD, resting comfortably CV: RRR no murmurs rubs or gallops Lungs: CTAB no crackles, wheeze, rhonchi Abdomen: soft/nontender/nondistended Ext: no edema Skin: warm, dry     Assessment and Plan   #  Diabetes with neuropathy.  S:  ordered Metformin 750 mg daily extended release with breakfast (1000 IR mg in the past) - but she does not think she started this -diarrhea resolved off metformin. Electrolyte powder stopped short term but restarted -Not on medicine for neuropathy- doesn't want to take anything.  Denies worsening symptoms CBGs- not checking Exercise and diet- not exercising Lab Results  Component Value Date   HGBA1C 6.4 03/06/2021   HGBA1C 6.8 (H) 01/28/2020   HGBA1C 6.2 (A) 09/08/2019   A/P: diarrhea on IR metformin but never started ER metformin. Check a1c today and if above 7 restart metformin extended release 765m and see if does better diarrhea wise. She will check at  home to see if she has it.  - neuropathy stable on no meds -monitor  only  #Hypertension S: controlled on nadolol 20 mg daily - in past on quinapril 5 mg at bedtime (found to have a fungal lip issue and was not angioedema)- but she has not restarted and BP still well controlled BP Readings from Last 3 Encounters:  09/05/21 108/70  06/06/21 118/70  03/06/21 110/66  A/P: Blood pressure well controlled-stay off quinapril for now- continue current meds  Depression  s: Compliant with Lexapro 20 mg daily. Loss of son to suicide in October 2018 contributed. [facility] 09/05/2021 04/19/2021 03/06/2021  Decreased Interest 0 0 0  Down, Depressed, Hopeless 0 0 0  PHQ - 2 Score 0 0 0  Altered sleeping 0 0 0  Tired, decreased energy 0 0 0  Change in appetite 0 0 0  Feeling bad or failure about yourself  0 0 0  Trouble concentrating 0 0 0  Moving slowly or fidgety/restless 0 0 0  Suicidal thoughts 0 0 0  PHQ-9 Score 0 0 0  Difficult doing work/chores Not difficult at all Not difficult at all Not difficult at all  Some recent data might be hidden  A/P: Depression is well controlled-continue current medication excep ttry half tablet of lexapro at 10 mg  #Hyperlipidemia S: Compliant with Crestor 10 mg daily. -Zetia 10 mg in the past - plan was to stop but she continued Lab Results  Component Value Date   CHOL 91 03/06/2021   HDL 37.70 (L) 03/06/2021   LDLCALC 23 03/06/2021   LDLDIRECT 40.0 09/28/2019   TRIG 148.0 03/06/2021   CHOLHDL 2 03/06/2021   A/P:  stop zetia- recheck LDL next visit  #Hypothyroidism S: Compliant with levothyroxine 88 mcg daily Lab Results  Component Value Date   TSH 1.48 06/06/2021   A/P:Controlled. Continue current medications.   Recommended follow up: Return in about 4 months (around 01/03/2022) for follow up- or sooner if needed.  Lab/Order associations:   ICD-10-CM   1. Type II diabetes mellitus with neurological manifestations (HCC)  E11.49  Comp Met (CMET)    HgB A1c    2. Hyperlipidemia associated with type 2 diabetes mellitus ([place]  E11.69    E78.5     3. Essential hypertension  I10     4. Hypothyroidism, unspecified type  E03.9     5. Major depressive disorder with single episode, in full remission ([place]  F32.5       Meds ordered this encounter  Medications   escitalopram (LEXAPRO) 10 MG tablet    Sig: Take 1 tablet (10 mg total) by mouth daily.    Dispense:  90 tablet    Refill:  3    I,Jada Bradford,acting as a scribe  for Garret Reddish, MD.,have documented all relevant documentation on the behalf of Garret Reddish, MD,as directed by  Garret Reddish, MD while in the presence of Garret Reddish, MD.  I, Garret Reddish, MD, have reviewed all documentation for this visit. The documentation on 09/05/21 for the exam, diagnosis, procedures, and orders are all accurate and complete.  Return precautions advised.  Garret Reddish, MD

## 2021-09-05 ENCOUNTER — Ambulatory Visit (INDEPENDENT_AMBULATORY_CARE_PROVIDER_SITE_OTHER): Payer: Medicare Other | Admitting: Family Medicine

## 2021-09-05 ENCOUNTER — Other Ambulatory Visit: Payer: Self-pay

## 2021-09-05 ENCOUNTER — Encounter: Payer: Self-pay | Admitting: Family Medicine

## 2021-09-05 VITALS — BP 108/70 | HR 66 | Temp 97.9°F | Ht 68.0 in | Wt 166.6 lb

## 2021-09-05 DIAGNOSIS — I10 Essential (primary) hypertension: Secondary | ICD-10-CM | POA: Diagnosis not present

## 2021-09-05 DIAGNOSIS — E039 Hypothyroidism, unspecified: Secondary | ICD-10-CM | POA: Diagnosis not present

## 2021-09-05 DIAGNOSIS — E1169 Type 2 diabetes mellitus with other specified complication: Secondary | ICD-10-CM | POA: Diagnosis not present

## 2021-09-05 DIAGNOSIS — F325 Major depressive disorder, single episode, in full remission: Secondary | ICD-10-CM

## 2021-09-05 DIAGNOSIS — E785 Hyperlipidemia, unspecified: Secondary | ICD-10-CM

## 2021-09-05 DIAGNOSIS — E1149 Type 2 diabetes mellitus with other diabetic neurological complication: Secondary | ICD-10-CM

## 2021-09-05 LAB — HEMOGLOBIN A1C: Hgb A1c MFr Bld: 6.6 % — ABNORMAL HIGH (ref 4.6–6.5)

## 2021-09-05 MED ORDER — ESCITALOPRAM OXALATE 10 MG PO TABS: 10.0000 mg | ORAL_TABLET | Freq: Every day | ORAL | 3 refills | Status: DC

## 2021-09-05 NOTE — Patient Instructions (Addendum)
Please stop by lab before you go If you have mychart- we will send your results within 3 business days of [place] receiving them.  If you do not have mychart- we will call you about results within 5 business days of [place] receiving them.  *please also note that you will see labs on mychart as soon as they post. I will later go in and write notes on them- will say "notes from Dr. Yong Channel"   See if you have metformin 750mg  extended release at home- we will decide whether to start this based off your a1c  Reduce escitalopram to 10 mg- I sent in new lower dose or you can cut in half the 20mg  you currently have. If you have worsening depressive symptoms let me know  May stop Zetia/Ezetimibe 10 mg. Recheck lipids/cholesterol next visit  Recommended follow up: Return in about 6 months (around 03/05/2022) for follow up- or sooner if needed.

## 2021-09-06 LAB — COMPREHENSIVE METABOLIC PANEL
ALT: 16 U/L (ref 0–35)
AST: 19 U/L (ref 0–37)
Albumin: 4.4 g/dL (ref 3.5–5.2)
Alkaline Phosphatase: 61 U/L (ref 39–117)
BUN: 14 mg/dL (ref 6–23)
CO2: 34 mEq/L — ABNORMAL HIGH (ref 19–32)
Calcium: 10 mg/dL (ref 8.4–10.5)
Chloride: 97 mEq/L (ref 96–112)
Creatinine, Ser: 0.87 mg/dL (ref 0.40–1.20)
GFR: 61.87 mL/min (ref >60.00)
Glucose, Bld: 123 mg/dL — ABNORMAL HIGH (ref 70–99)
Potassium: 4.2 mEq/L (ref 3.5–5.1)
Sodium: 139 mEq/L (ref 135–145)
Total Bilirubin: 1.1 mg/dL (ref 0.2–1.2)
Total Protein: 7.5 g/dL (ref 6.0–8.3)

## 2021-09-06 LAB — MICROALBUMIN / CREATININE URINE RATIO
Creatinine,U: 103.9 mg/dL
Microalb Creat Ratio: 17.1 mg/g (ref 0.0–30.0)
Microalb, Ur: 17.8 mg/dL — ABNORMAL HIGH (ref 0.0–1.9)

## 2021-09-07 ENCOUNTER — Ambulatory Visit: Payer: Medicare Other | Admitting: Family Medicine

## 2021-09-19 ENCOUNTER — Encounter: Payer: Self-pay | Admitting: Family Medicine

## 2021-09-19 NOTE — Telephone Encounter (Signed)
Called and left VM to schedule virtual with [facility]. Can overbook end of the day or after last appt of the morning. CM

## 2021-09-21 ENCOUNTER — Other Ambulatory Visit: Payer: Self-pay | Admitting: Family Medicine

## 2021-10-02 ENCOUNTER — Other Ambulatory Visit: Payer: Self-pay

## 2021-10-02 ENCOUNTER — Emergency Department (HOSPITAL_COMMUNITY): Payer: Medicare Other

## 2021-10-02 ENCOUNTER — Ambulatory Visit: Payer: Medicare Other | Admitting: Family

## 2021-10-02 ENCOUNTER — Inpatient Hospital Stay (HOSPITAL_COMMUNITY)
Admission: EM | Admit: 2021-10-02 | Discharge: 2021-10-04 | DRG: 871 | Disposition: A | Discharge status: Home / Self Care | Payer: Medicare Other | Attending: Family Medicine | Admitting: Family Medicine

## 2021-10-02 ENCOUNTER — Encounter (HOSPITAL_COMMUNITY): Payer: Self-pay | Admitting: Emergency Medicine

## 2021-10-02 DIAGNOSIS — Z66 Do not resuscitate: Secondary | ICD-10-CM | POA: Diagnosis present

## 2021-10-02 DIAGNOSIS — A419 Sepsis, unspecified organism: Secondary | ICD-10-CM | POA: Diagnosis present

## 2021-10-02 DIAGNOSIS — Z833 Family history of diabetes mellitus: Secondary | ICD-10-CM

## 2021-10-02 DIAGNOSIS — Z923 Personal history of irradiation: Secondary | ICD-10-CM | POA: Diagnosis not present

## 2021-10-02 DIAGNOSIS — I10 Essential (primary) hypertension: Secondary | ICD-10-CM | POA: Diagnosis present

## 2021-10-02 DIAGNOSIS — N390 Urinary tract infection, site not specified: Secondary | ICD-10-CM | POA: Diagnosis present

## 2021-10-02 DIAGNOSIS — N3000 Acute cystitis without hematuria: Secondary | ICD-10-CM

## 2021-10-02 DIAGNOSIS — Z79899 Other long term (current) drug therapy: Secondary | ICD-10-CM

## 2021-10-02 DIAGNOSIS — Z9221 Personal history of antineoplastic chemotherapy: Secondary | ICD-10-CM

## 2021-10-02 DIAGNOSIS — I160 Hypertensive urgency: Secondary | ICD-10-CM | POA: Diagnosis not present

## 2021-10-02 DIAGNOSIS — R17 Unspecified jaundice: Secondary | ICD-10-CM | POA: Diagnosis present

## 2021-10-02 DIAGNOSIS — J9601 Acute respiratory failure with hypoxia: Secondary | ICD-10-CM | POA: Diagnosis not present

## 2021-10-02 DIAGNOSIS — Z79890 Hormone replacement therapy (postmenopausal): Secondary | ICD-10-CM | POA: Diagnosis not present

## 2021-10-02 DIAGNOSIS — Z87891 Personal history of nicotine dependence: Secondary | ICD-10-CM

## 2021-10-02 DIAGNOSIS — R0602 Shortness of breath: Secondary | ICD-10-CM | POA: Diagnosis not present

## 2021-10-02 DIAGNOSIS — E7800 Pure hypercholesterolemia, unspecified: Secondary | ICD-10-CM | POA: Diagnosis present

## 2021-10-02 DIAGNOSIS — F1020 Alcohol dependence, uncomplicated: Secondary | ICD-10-CM | POA: Diagnosis present

## 2021-10-02 DIAGNOSIS — Z853 Personal history of malignant neoplasm of breast: Secondary | ICD-10-CM

## 2021-10-02 DIAGNOSIS — J189 Pneumonia, unspecified organism: Secondary | ICD-10-CM | POA: Diagnosis not present

## 2021-10-02 DIAGNOSIS — E890 Postprocedural hypothyroidism: Secondary | ICD-10-CM | POA: Diagnosis present

## 2021-10-02 DIAGNOSIS — E876 Hypokalemia: Secondary | ICD-10-CM | POA: Diagnosis present

## 2021-10-02 DIAGNOSIS — E1149 Type 2 diabetes mellitus with other diabetic neurological complication: Secondary | ICD-10-CM | POA: Diagnosis present

## 2021-10-02 DIAGNOSIS — Z800 Family history of malignant neoplasm of digestive organs: Secondary | ICD-10-CM | POA: Diagnosis not present

## 2021-10-02 DIAGNOSIS — Z20822 Contact with and (suspected) exposure to covid-19: Secondary | ICD-10-CM | POA: Diagnosis present

## 2021-10-02 DIAGNOSIS — E039 Hypothyroidism, unspecified: Secondary | ICD-10-CM | POA: Diagnosis present

## 2021-10-02 DIAGNOSIS — J439 Emphysema, unspecified: Secondary | ICD-10-CM | POA: Diagnosis not present

## 2021-10-02 DIAGNOSIS — R059 Cough, unspecified: Secondary | ICD-10-CM | POA: Diagnosis not present

## 2021-10-02 LAB — URINALYSIS, ROUTINE W REFLEX MICROSCOPIC
Bilirubin Urine: NEGATIVE
Glucose, UA: NEGATIVE mg/dL
Hgb urine dipstick: NEGATIVE
Ketones, ur: 5 mg/dL — AB
Nitrite: POSITIVE — AB
Protein, ur: 100 mg/dL — AB
Specific Gravity, Urine: 1.011 (ref 1.005–1.030)
pH: 5 (ref 5.0–8.0)

## 2021-10-02 LAB — COMPREHENSIVE METABOLIC PANEL
ALT: 22 U/L (ref 0–44)
AST: 24 U/L (ref 15–41)
Albumin: 3.7 g/dL (ref 3.5–5.0)
Alkaline Phosphatase: 61 U/L (ref 38–126)
Anion gap: 12 (ref 5–15)
BUN: 12 mg/dL (ref 8–23)
CO2: 27 mmol/L (ref 22–32)
Calcium: 9.4 mg/dL (ref 8.9–10.3)
Chloride: 101 mmol/L (ref 98–111)
Creatinine, Ser: 0.92 mg/dL (ref 0.44–1.00)
GFR, Estimated: >60 mL/min (ref >60)
Glucose, Bld: 166 mg/dL — ABNORMAL HIGH (ref 70–99)
Potassium: 3.4 mmol/L — ABNORMAL LOW (ref 3.5–5.1)
Sodium: 140 mmol/L (ref 135–145)
Total Bilirubin: 1.6 mg/dL — ABNORMAL HIGH (ref 0.3–1.2)
Total Protein: 7 g/dL (ref 6.5–8.1)

## 2021-10-02 LAB — CBC WITH DIFFERENTIAL/PLATELET
Abs Immature Granulocytes: 0.09 10*3/uL — ABNORMAL HIGH (ref 0.00–0.07)
Basophils Absolute: 0.1 10*3/uL (ref 0.0–0.1)
Basophils Relative: 0 %
Eosinophils Absolute: 0.1 10*3/uL (ref 0.0–0.5)
Eosinophils Relative: 1 %
HCT: 46.6 % — ABNORMAL HIGH (ref 36.0–46.0)
Hemoglobin: 15.4 g/dL — ABNORMAL HIGH (ref 12.0–15.0)
Immature Granulocytes: 1 %
Lymphocytes Relative: 8 %
Lymphs Abs: 1 10*3/uL (ref 0.7–4.0)
MCH: 32 pg (ref 26.0–34.0)
MCHC: 33 g/dL (ref 30.0–36.0)
MCV: 96.9 fL (ref 80.0–100.0)
Monocytes Absolute: 0.8 10*3/uL (ref 0.1–1.0)
Monocytes Relative: 6 %
Neutro Abs: 10.8 10*3/uL — ABNORMAL HIGH (ref 1.7–7.7)
Neutrophils Relative %: 84 %
Platelets: 183 10*3/uL (ref 150–400)
RBC: 4.81 MIL/uL (ref 3.87–5.11)
RDW: 13.5 % (ref 11.5–15.5)
WBC: 12.9 10*3/uL — ABNORMAL HIGH (ref 4.0–10.5)
nRBC: 0 % (ref 0.0–0.2)

## 2021-10-02 LAB — RESP PANEL BY RT-PCR (FLU A&B, COVID) ARPGX2
Influenza A by PCR: NEGATIVE
Influenza B by PCR: NEGATIVE
SARS Coronavirus 2 by RT PCR: NEGATIVE

## 2021-10-02 LAB — PROCALCITONIN: Procalcitonin: 0.46 ng/mL

## 2021-10-02 LAB — TROPONIN I (HIGH SENSITIVITY)
Troponin I (High Sensitivity): 7 ng/L (ref <18)
Troponin I (High Sensitivity): 10 ng/L (ref <18)

## 2021-10-02 LAB — STREP PNEUMONIAE URINARY ANTIGEN: Strep Pneumo Urinary Antigen: NEGATIVE

## 2021-10-02 LAB — D-DIMER, QUANTITATIVE: D-Dimer, Quant: 1.65 ug/mL-FEU — ABNORMAL HIGH (ref 0.00–0.50)

## 2021-10-02 LAB — GLUCOSE, CAPILLARY: Glucose-Capillary: 156 mg/dL — ABNORMAL HIGH (ref 70–99)

## 2021-10-02 LAB — BRAIN NATRIURETIC PEPTIDE: B Natriuretic Peptide: 100 pg/mL (ref 0.0–100.0)

## 2021-10-02 MED ORDER — SODIUM CHLORIDE 0.9 % IV SOLN
1.0000 g | Freq: Once | INTRAVENOUS | Status: AC
  Administered 2021-10-02: 1 g via INTRAVENOUS
  Filled 2021-10-02: qty 10

## 2021-10-02 MED ORDER — FLUTICASONE PROPIONATE 50 MCG/ACT NA SUSP
2.0000 | Freq: Every day | NASAL | Status: DC | PRN
  Filled 2021-10-02: qty 16

## 2021-10-02 MED ORDER — BUTALBITAL-APAP-CAFFEINE 50-325-40 MG PO TABS
1.0000 | ORAL_TABLET | Freq: Four times a day (QID) | ORAL | Status: DC | PRN
  Administered 2021-10-02 – 2021-10-03 (×2): 1 via ORAL
  Filled 2021-10-02 (×2): qty 1

## 2021-10-02 MED ORDER — SODIUM CHLORIDE 0.9 % IV SOLN
500.0000 mg | Freq: Once | INTRAVENOUS | Status: AC
  Administered 2021-10-02: 500 mg via INTRAVENOUS
  Filled 2021-10-02: qty 5

## 2021-10-02 MED ORDER — THIAMINE HCL 100 MG/ML IJ SOLN
100.0000 mg | Freq: Every day | INTRAMUSCULAR | Status: DC
  Filled 2021-10-02: qty 2

## 2021-10-02 MED ORDER — LORAZEPAM 1 MG PO TABS: 1.0000 mg | ORAL_TABLET | ORAL | Status: DC | PRN

## 2021-10-02 MED ORDER — LORAZEPAM 2 MG/ML IJ SOLN: 1.0000 mg | INTRAMUSCULAR | Status: DC | PRN

## 2021-10-02 MED ORDER — GUAIFENESIN ER 600 MG PO TB12
600.0000 mg | ORAL_TABLET | Freq: Two times a day (BID) | ORAL | Status: DC
  Administered 2021-10-02 – 2021-10-04 (×5): 600 mg via ORAL
  Filled 2021-10-02 (×5): qty 1

## 2021-10-02 MED ORDER — POTASSIUM CHLORIDE CRYS ER 20 MEQ PO TBCR
20.0000 meq | EXTENDED_RELEASE_TABLET | ORAL | Status: AC
  Administered 2021-10-02: 20 meq via ORAL
  Filled 2021-10-02: qty 1

## 2021-10-02 MED ORDER — INFLUENZA VAC A&B SA ADJ QUAD 0.5 ML IM PRSY
0.5000 mL | PREFILLED_SYRINGE | INTRAMUSCULAR | Status: DC
  Filled 2021-10-02: qty 0.5

## 2021-10-02 MED ORDER — FOLIC ACID 1 MG PO TABS
1.0000 mg | ORAL_TABLET | Freq: Every day | ORAL | Status: DC
  Administered 2021-10-02 – 2021-10-04 (×3): 1 mg via ORAL
  Filled 2021-10-02 (×3): qty 1

## 2021-10-02 MED ORDER — LORATADINE 10 MG PO TABS
10.0000 mg | ORAL_TABLET | Freq: Every day | ORAL | Status: DC
  Administered 2021-10-02 – 2021-10-04 (×3): 10 mg via ORAL
  Filled 2021-10-02 (×3): qty 1

## 2021-10-02 MED ORDER — ENOXAPARIN SODIUM 40 MG/0.4ML IJ SOSY
40.0000 mg | PREFILLED_SYRINGE | Freq: Every day | INTRAMUSCULAR | Status: DC
  Administered 2021-10-02 – 2021-10-03 (×2): 40 mg via SUBCUTANEOUS
  Filled 2021-10-02 (×2): qty 0.4

## 2021-10-02 MED ORDER — ADULT MULTIVITAMIN W/MINERALS CH
1.0000 | ORAL_TABLET | Freq: Every day | ORAL | Status: DC
  Administered 2021-10-02 – 2021-10-04 (×3): 1 via ORAL
  Filled 2021-10-02 (×3): qty 1

## 2021-10-02 MED ORDER — ONDANSETRON HCL 4 MG/2ML IJ SOLN: 4.0000 mg | Freq: Four times a day (QID) | INTRAMUSCULAR | Status: DC | PRN

## 2021-10-02 MED ORDER — ESCITALOPRAM OXALATE 10 MG PO TABS
10.0000 mg | ORAL_TABLET | Freq: Every day | ORAL | Status: DC
  Administered 2021-10-02 – 2021-10-04 (×3): 10 mg via ORAL
  Filled 2021-10-02 (×3): qty 1

## 2021-10-02 MED ORDER — ACETAMINOPHEN 650 MG RE SUPP: 650.0000 mg | Freq: Four times a day (QID) | RECTAL | Status: DC | PRN

## 2021-10-02 MED ORDER — ALBUTEROL SULFATE HFA 108 (90 BASE) MCG/ACT IN AERS: 2.0000 | INHALATION_SPRAY | RESPIRATORY_TRACT | Status: DC | PRN

## 2021-10-02 MED ORDER — LEVOTHYROXINE SODIUM 88 MCG PO TABS
88.0000 ug | ORAL_TABLET | Freq: Every day | ORAL | Status: DC
  Administered 2021-10-03 – 2021-10-04 (×2): 88 ug via ORAL
  Filled 2021-10-02 (×3): qty 1

## 2021-10-02 MED ORDER — SODIUM CHLORIDE 0.9% FLUSH
3.0000 mL | Freq: Two times a day (BID) | INTRAVENOUS | Status: DC
  Administered 2021-10-02 – 2021-10-04 (×4): 3 mL via INTRAVENOUS

## 2021-10-02 MED ORDER — AZITHROMYCIN 500 MG PO TABS
500.0000 mg | ORAL_TABLET | Freq: Every day | ORAL | Status: DC
  Administered 2021-10-03 – 2021-10-04 (×2): 500 mg via ORAL
  Filled 2021-10-02 (×2): qty 1

## 2021-10-02 MED ORDER — TRAZODONE HCL 50 MG PO TABS: 25.0000 mg | ORAL_TABLET | Freq: Every evening | ORAL | Status: DC | PRN

## 2021-10-02 MED ORDER — HYDRALAZINE HCL 20 MG/ML IJ SOLN
10.0000 mg | INTRAMUSCULAR | Status: DC | PRN
Start: 2021-10-02 — End: 2021-10-04

## 2021-10-02 MED ORDER — SODIUM CHLORIDE 0.9 % IV SOLN
2.0000 g | INTRAVENOUS | Status: DC
  Administered 2021-10-03 – 2021-10-04 (×2): 2 g via INTRAVENOUS
  Filled 2021-10-02 (×2): qty 20

## 2021-10-02 MED ORDER — THIAMINE HCL 100 MG PO TABS
100.0000 mg | ORAL_TABLET | Freq: Every day | ORAL | Status: DC
  Administered 2021-10-02 – 2021-10-04 (×3): 100 mg via ORAL
  Filled 2021-10-02 (×3): qty 1

## 2021-10-02 MED ORDER — ROSUVASTATIN CALCIUM 5 MG PO TABS
10.0000 mg | ORAL_TABLET | Freq: Every day | ORAL | Status: DC
  Administered 2021-10-02 – 2021-10-04 (×3): 10 mg via ORAL
  Filled 2021-10-02 (×3): qty 2

## 2021-10-02 MED ORDER — ONDANSETRON HCL 4 MG PO TABS: 4.0000 mg | ORAL_TABLET | Freq: Four times a day (QID) | ORAL | Status: DC | PRN

## 2021-10-02 MED ORDER — ACETAMINOPHEN 500 MG PO TABS
1000.0000 mg | ORAL_TABLET | Freq: Once | ORAL | Status: AC
  Administered 2021-10-02: 1000 mg via ORAL
  Filled 2021-10-02: qty 2

## 2021-10-02 MED ORDER — ACETAMINOPHEN 325 MG PO TABS
650.0000 mg | ORAL_TABLET | Freq: Four times a day (QID) | ORAL | Status: DC | PRN
  Administered 2021-10-03: 650 mg via ORAL
  Filled 2021-10-02: qty 2

## 2021-10-02 MED ORDER — ALBUTEROL SULFATE (2.5 MG/3ML) 0.083% IN NEBU: 2.5000 mg | INHALATION_SOLUTION | Freq: Four times a day (QID) | RESPIRATORY_TRACT | Status: DC | PRN

## 2021-10-02 MED ORDER — NADOLOL 20 MG PO TABS
20.0000 mg | ORAL_TABLET | Freq: Every day | ORAL | Status: DC
  Administered 2021-10-02 – 2021-10-04 (×3): 20 mg via ORAL
  Filled 2021-10-02 (×3): qty 1

## 2021-10-02 NOTE — Assessment & Plan Note (Signed)
Last TSH was 1.48 06/06/2021.  Home medication regimen includes levothyroxine 88 mcg daily. ?-Continue levothyroxine ?

## 2021-10-02 NOTE — H&P (Addendum)
History and Physical    Patient: Kim Singh WNU:[id number] DOB: 1941/05/04 DOA: 10/02/2021 DOS: the patient was seen and examined on 10/02/2021 PCP: Marin Olp, MD  Patient coming from: Home lives with daughter   Chief Complaint:  Chief Complaint  Patient presents with   Shortness of Breath   HPI: Kim Singh is a 81 y.o. female with medical history significant of hypertension, hyperlipidemia, hypothyroidism, neuropathy, breast cancer and diabetes mellitus type 2 presents with complaints of shortness of breath and cough.  She reports having a 4-day history of having a productive cough.  Patient has been swallowing sputum and does not normally spit it out.  Notes associated symptoms of headache and nausea.  This morning she woke up this morning unable to catch her breath, chest discomfort, and chills with shakes.  She chronically has issues with neuropathy that are unchanged.  At home patient had been using over-the-counter medications for cough symptoms.  She reports having some mild increase in urinary frequency, but attributed to increased fluid intake.  Denies any fever, leg pain, leg swelling, vomiting, diarrhea, dysuria, prolonged immobility, or recent sick contacts to her knowledge. At baseline she is not on oxygen.  She reports having approximately at least 39 smoking pack-year history of tobacco, but quit in 1999.  She drinks a glass of wine per night most nights, but denies any history of withdrawals.  Upon admission to the emergency department patient patient was noted to be afebrile with blood pressure elevated up to 190/106, O2 saturations as low as 87% with improvement on 2 L nasal cannula oxygen.  Labs significant for WBC 12.9, hemoglobin 15.4, potassium 3.4, glucose 166, and total bilirubin 1.6.  Chest x-ray noted a new left lower lobe opacity concerning for pneumonia.  Patient has been given Tylenol, Rocephin, and azithromycin.  Review of Systems: As mentioned in the  history of present illness. All other systems reviewed and are negative. Past Medical History:  Diagnosis Date   Breast cancer ([place]) 2001   Left   Diabetes mellitus type 2, uncomplicated ([place])    Personal history of chemotherapy    Personal history of radiation therapy    Pure hypercholesterolemia    Unspecified disorder of thyroid    Unspecified essential hypertension    Past Surgical History:  Procedure Laterality Date   BREAST EXCISIONAL BIOPSY Left    BREAST LUMPECTOMY Left 2001   THYROIDECTOMY     total   Social History:  reports that she quit smoking about 24 years ago. Her smoking use included cigarettes. She has a 41.00 pack-year smoking history. She has never used smokeless tobacco. She reports current alcohol use of about 7.0 standard drinks per week. She reports that she does not use drugs.  Allergies  Allergen Reactions   Codeine Other (See Comments)    GI upset    Family History  Problem Relation Age of Onset   Diabetes Mother    Hearing loss Father    Cancer Sister        Bile Duct Cancer   Depression Son     Prior to Admission medications   Medication Sig Start Date End Date Taking? Authorizing Provider  CALCIUM-VITAMIN D PO Take 1 tablet by mouth 2 (two) times daily.     [provider]  cetirizine (ZYRTEC) 5 MG tablet Take 1 tablet (5 mg total) by mouth daily. 03/06/21   Marin Olp, MD  escitalopram (LEXAPRO) 10 MG tablet Take 1 tablet (10 mg total) by  mouth daily. 09/05/21   Marin Olp, MD  fluticasone (FLONASE) 50 MCG/ACT nasal spray Place 2 sprays into both nostrils daily. Patient taking differently: Place 2 sprays into both nostrils as needed. 01/24/21   Scot Jun, FNP  levothyroxine (SYNTHROID) 88 MCG tablet Take 1 tablet (88 mcg total) by mouth daily. 03/07/21   Marin Olp, MD  Multiple Vitamin (MULTIVITAMIN) tablet Take 1 tablet by mouth daily.    [provider]  Multiple Vitamins-Minerals (ICAPS LUTEIN &  ZEAXANTH, MVI, PO) Take by mouth daily.    [provider]  nadolol (CORGARD) 20 MG tablet TAKE 1 TABLET DAILY 08/21/21   Marin Olp, MD  nystatin ointment (MYCOSTATIN) as needed. 11/03/19   [provider]  Omega-3-acid Ethyl Esters (OMACOR PO) Take 1 tablet by mouth 2 (two) times daily.     [provider]  rosuvastatin (CRESTOR) 10 MG tablet TAKE 1 TABLET DAILY 09/21/21   Marin Olp, MD  traZODone (DESYREL) 50 MG tablet TAKE ONE-HALF (1/2) TABLET AT BEDTIME AS NEEDED FOR SLEEP 03/07/21   Marin Olp, MD  vitamin B-12 (CYANOCOBALAMIN) 1000 MCG tablet Take 1,000 mcg by mouth once a week. Takes on Mondays    [provider]    Physical Exam: Vitals:   10/02/21 1015 10/02/21 1030 10/02/21 1045 10/02/21 1115  BP: (!) 151/84 (!) 146/78 131/72 126/73  Pulse: 77 79 74 69  Resp: 18 (!) '21 20 18  '$ Temp:      TempSrc:      SpO2: 98% 96% 91% 95%   Constitutional: Elderly female who appears to be acutely ill but in no acute distress Eyes: PERRL, lids and conjunctivae normal ENMT: Mucous membranes are moist. Posterior pharynx clear of any exudate or lesions.  Neck: normal, supple, no masses, no thyromegaly Respiratory: Normal respiratory effort with some mild crackles noted on the left lung field. Cardiovascular: Regular rate and rhythm, no murmurs / rubs / gallops. No extremity edema. .  Abdomen: no tenderness, no masses palpated. No hepatosplenomegaly. Bowel sounds positive.  Musculoskeletal: no clubbing / cyanosis. No joint deformity upper and lower extremities.  Skin: no rashes, lesions, ulcers. No induration Neurologic: CN 2-12 grossly intact. Sensation intact, DTR normal. Strength 5/5 in all 4.  Psychiatric: Normal judgment and insight. Alert and oriented x 3. Normal mood.   Data Reviewed:  Reviewed all images and studies as noted above in HPI.  EKG noted sinus rhythm at 88 bpm  Assessment and Plan: * Acute respiratory failure with  hypoxia secondary to community acquired pneumonia Patient presented with complaints of cough and was noted to have mild tachypnea.  Found to be hypoxic down to 87% on room air with improvement on 2 L of nasal cannula oxygen.  Chest x-ray noting a left lower lobe opacity concerning for pneumonia.  She had been given Rocephin and azithromycin IV.  Lower suspicion for possibility of a -Admit to a medical telemetry bed -Continuous pulse oximetry with nasal cannula oxygen maintain O2 saturation greater than 92% -Incentive spirometry and flutter valve -Check procalcitonin and D-dimer -Continue empiric antibiotics of Rocephin and azithromycin -Mucinex  UTI (urinary tract infection) Acute.  Patient reports having some increased frequency of urination yesterday, but denies any dysuria symptoms.  Urinalysis was positive for moderate leukocytes, positive nitrates, rare bacteria, and 6-10 WBCs. -Check urine culture -Adequate coverage with antibiotics for pneumonia.  Hypertensive urgency Acute.  On admission into the emergency department patient blood pressure was noted to be  elevated up to 190/106.  Home blood pressure regimen includes nadolol 20 mg daily. -Continue nadolol -Hydralazine IV as needed for elevated blood pressure  Hypokalemia Acute.  Potassium 3.4 on admission. -Give 20 mEq of potassium chloride p.o. -Continue to monitor and replace as needed  Hyperbilirubinemia Acute.  Total bilirubin elevated at 1.6.  Hypothyroidism Last TSH was 1.48 06/06/2021.  Home medication regimen includes levothyroxine 88 mcg daily. -Continue levothyroxine  Dyslipidemia Home medication regimen includes Crestor 10 mg daily. -Continue Crestor  Alcohol use Patient reports that she normally drinks a glass or two of wine on most nights.  Denies any history of alcohol withdrawal. -[facility] protocols    Advance Care Planning:   Code Status: DNR.  Confirmed with daughter at bedside.  Consults: None  Family  Communication: Daughter updated at bedside  Severity of Illness: The appropriate patient status for this patient is INPATIENT. Inpatient status is judged to be reasonable and necessary in order to provide the required intensity of service to ensure the patient's safety. The patient's presenting symptoms, physical exam findings, and initial radiographic and laboratory data in the context of their chronic comorbidities is felt to place them at high risk for further clinical deterioration. Furthermore, it is not anticipated that the patient will be medically stable for discharge from the hospital within 2 midnights of admission.   * I certify that at the point of admission it is my clinical judgment that the patient will require inpatient hospital care spanning beyond 2 midnights from the point of admission due to high intensity of service, high risk for further deterioration and high frequency of surveillance required.*  Author: Norval Morton, MD 10/02/2021 11:23 AM  For on call review [email or web address].

## 2021-10-02 NOTE — Assessment & Plan Note (Signed)
Home medication regimen includes Crestor 10 mg daily. ?-Continue Crestor ?

## 2021-10-02 NOTE — ED Provider Notes (Signed)
[facility] EMERGENCY DEPARTMENT Provider Note   CSN: [id number] Arrival date & time: 10/02/21  7824     History  Chief Complaint  Patient presents with   Shortness of Breath    Kim Singh is a 81 y.o. female.   Shortness of Breath  81 year old female with a history of DM 2, breast cancer status postchemotherapy and radiation therapy in 2001, HLD, HTN presenting to the emergency department with URI symptoms since Thursday. Headache, chills, dry cough initially, now productive. Endorsed worsening feeling of dyspnea since waking up thios morning. Couldn't mcatch her breath. No fevers, muscle aches, chills. Coughing up some sputum now. Some pleuritic chest discomfort.   Home Medications Prior to Admission medications   Medication Sig Start Date End Date Taking? Authorizing Provider  CALCIUM-VITAMIN D PO Take 1 tablet by mouth 2 (two) times daily.     [provider]  cetirizine (ZYRTEC) 5 MG tablet Take 1 tablet (5 mg total) by mouth daily. 03/06/21   Marin Olp, MD  escitalopram (LEXAPRO) 10 MG tablet Take 1 tablet (10 mg total) by mouth daily. 09/05/21   Marin Olp, MD  fluticasone (FLONASE) 50 MCG/ACT nasal spray Place 2 sprays into both nostrils daily. Patient taking differently: Place 2 sprays into both nostrils as needed. 01/24/21   Scot Jun, FNP  levothyroxine (SYNTHROID) 88 MCG tablet Take 1 tablet (88 mcg total) by mouth daily. 03/07/21   Marin Olp, MD  Multiple Vitamin (MULTIVITAMIN) tablet Take 1 tablet by mouth daily.    [provider]  Multiple Vitamins-Minerals (ICAPS LUTEIN & ZEAXANTH, MVI, PO) Take by mouth daily.    [provider]  nadolol (CORGARD) 20 MG tablet TAKE 1 TABLET DAILY 08/21/21   Marin Olp, MD  nystatin ointment (MYCOSTATIN) as needed. 11/03/19   [provider]  Omega-3-acid Ethyl Esters (OMACOR PO) Take 1 tablet by mouth 2 (two) times daily.     [provider]  rosuvastatin (CRESTOR) 10 MG tablet TAKE 1 TABLET DAILY 09/21/21   Marin Olp, MD  traZODone (DESYREL) 50 MG tablet TAKE ONE-HALF (1/2) TABLET AT BEDTIME AS NEEDED FOR SLEEP 03/07/21   Marin Olp, MD  vitamin B-12 (CYANOCOBALAMIN) 1000 MCG tablet Take 1,000 mcg by mouth once a week. Takes on Mondays    [provider]      Allergies    Codeine    Review of Systems   Review of Systems  Respiratory:  Positive for shortness of breath.   All other systems reviewed and are negative.  Physical Exam Updated Vital Signs BP 126/73    Pulse 69    Temp (!) 97.4 F (36.3 C) (Oral)    Resp 18    SpO2 95%  Physical Exam Vitals and nursing note reviewed.  Constitutional:      General: She is not in acute distress.    Appearance: She is well-developed.  HENT:     Head: Normocephalic and atraumatic.  Eyes:     Conjunctiva/sclera: Conjunctivae normal.  Cardiovascular:     Rate and Rhythm: Normal rate and regular rhythm.     Heart sounds: No murmur heard. Pulmonary:     Effort: Pulmonary effort is normal. No respiratory distress.     Breath sounds: Examination of the right-middle field reveals rhonchi. Examination of the left-middle field reveals rhonchi. Examination of the left-lower field reveals decreased breath sounds. Decreased breath sounds and rhonchi present.  Abdominal:  Palpations: Abdomen is soft.     Tenderness: There is no abdominal tenderness.  Musculoskeletal:        General: No swelling.     Cervical back: Neck supple.  Skin:    General: Skin is warm and dry.     Capillary Refill: Capillary refill takes less than 2 seconds.  Neurological:     Mental Status: She is alert.  Psychiatric:        Mood and Affect: Mood normal.    ED Results / Procedures / Treatments   Labs (all labs ordered are listed, but only abnormal results are displayed) Labs Reviewed  CBC WITH DIFFERENTIAL/PLATELET - Abnormal; Notable for the following  components:      Result Value   WBC 12.9 (*)    Hemoglobin 15.4 (*)    HCT 46.6 (*)    Neutro Abs 10.8 (*)    Abs Immature Granulocytes 0.09 (*)    All other components within normal limits  COMPREHENSIVE METABOLIC PANEL - Abnormal; Notable for the following components:   Potassium 3.4 (*)    Glucose, Bld 166 (*)    Total Bilirubin 1.6 (*)    All other components within normal limits  RESP PANEL BY RT-PCR (FLU A&B, COVID) ARPGX2  BRAIN NATRIURETIC PEPTIDE  URINALYSIS, ROUTINE W REFLEX MICROSCOPIC  TROPONIN I (HIGH SENSITIVITY)  TROPONIN I (HIGH SENSITIVITY)    EKG EKG Interpretation  Date/Time:  Monday October 02 2021 09:40:33 EDT Ventricular Rate:  84 PR Interval:    QRS Duration: 80 QT Interval:  362 QTC Calculation: 427 R Axis:   -44 Text Interpretation: Normal sinus rhythm Left axis deviation Low voltage QRS When compared with ECG of 22-Jul-2016 01:03, PREVIOUS ECG IS PRESENT Confirmed by Regan Lemming ([id number]) on 10/02/2021 9:52:55 AM  Radiology DG Chest 2 View  Result Date: 10/02/2021 CLINICAL DATA:  Shortness of breath. EXAM: CHEST - 2 VIEW COMPARISON:  Chest x-ray dated May 23, 2017. FINDINGS: Stable cardiomediastinal silhouette new interstitial thickening and hazy airspace opacities at the left lung base. No pleural effusion or pneumothorax. No acute osseous abnormality. IMPRESSION: 1. New left lower lobe pneumonia. Atypical infection is a consideration. Electronically Signed   By: Titus Dubin M.D.   On: 10/02/2021 10:05    Procedures Procedures    Medications Ordered in ED Medications  albuterol (VENTOLIN HFA) 108 (90 Base) MCG/ACT inhaler 2 puff (has no administration in time range)  azithromycin (ZITHROMAX) 500 mg in sodium chloride 0.9 % 250 mL IVPB (has no administration in time range)  acetaminophen (TYLENOL) tablet 1,000 mg (1,000 mg Oral Given 10/02/21 1027)  cefTRIAXone (ROCEPHIN) 1 g in sodium chloride 0.9 % 100 mL IVPB (1 g Intravenous New Bag/Given  10/02/21 1041)    ED Course/ Medical Decision Making/ A&P Clinical Course as of 10/02/21 1125  Mon Oct 02, 2021  1025 WBC(!): 12.9 [JL]  1034 SpO2(!): 87 % [JL]    Clinical Course User Index [JL] Regan Lemming, MD                           Medical Decision Making Amount and/or Complexity of Data Reviewed Labs:  Decision-making details documented in ED Course.  Risk OTC drugs. Decision regarding hospitalization.   81 year old female with a history of DM 2, breast cancer status postchemotherapy and radiation therapy in 2001, HLD, HTN presenting to the emergency department with URI symptoms since Thursday. Headache, chills, dry cough initially, now productive. Endorsed worsening  feeling of dyspnea since waking up thios morning. Couldn't mcatch her breath. No fevers, muscle aches, chills. Coughing up some sputum now. Some pleuritic chest discomfort.   On arrival, the patient was afebrile, hypertensive BP 190/106, subsequent improved to 151/84 without intervention, hypoxic saturating 87% on room air.  The patient was subsequently placed on oxygen via nasal cannula with improvement in her saturations to 98%.  Differential diagnosis includes viral URI, COVID-19, influenza, developing bacterial pneumonia, less likely PE, hypertensive emergency, flash pulmonary edema, ACS.  EKG which revealed sinus rhythm, no acute ischemic changes noted.  Chest x-ray was performed which revealed focal consolidation in the left lower lobe concerning for developing bacterial pneumonia.  A laboratory work-up was concerning for a leukocytosis to 12.9.  Given the patient's acute hypoxic respiratory failure, productive cough, physical exam findings, leukocytosis, chest x-ray findings, concern for developing community-acquired pneumonia requiring oxygen and thus hospital admission.  IV access was obtained and the patient was administered IV antibiotics to cover for community-acquired pneumonia.  Medicine consulted for  admission. Dr. Tamala Julian of hospitalist medicine accepted the patient in admission.    Final Clinical Impression(s) / ED Diagnoses Final diagnoses:  Community acquired pneumonia of left lower lobe of lung  Acute respiratory failure with hypoxia [facility])    Rx / DC Orders ED Discharge Orders     None         Regan Lemming, MD 10/02/21 1125

## 2021-10-02 NOTE — Assessment & Plan Note (Signed)
Acute.  Total bilirubin elevated at 1.6. ?

## 2021-10-02 NOTE — ED Notes (Signed)
ED TO INPATIENT HANDOFF REPORT  ED Nurse Name and Phone #: Darnelle Maffucci 38  S Name/Age/Gender Kim Singh 81 y.o. female Room/Bed: 038C/038C  Code Status   Code Status: DNR  Home/SNF/Other Home Patient oriented to: self, place, and time Is this baseline? Yes   Triage Complete: Triage complete  Chief Complaint Community acquired pneumonia [J18.9]  Triage Note Patient here with complaint of general malaise, shorntess of breath, and chest pressure for the last week. Does not wear supplemental O2 at baseline, room air SpO2 84% while talking, 90% when silent, placed on 2L O2 . Patient speaking in complete sentences, afebrile in triage, and in no apparent distress at this time.   Allergies Allergies  Allergen Reactions   Codeine Other (See Comments)    GI upset    Level of Care/Admitting Diagnosis ED Disposition     ED Disposition  Admit   Condition  --   Comment  Hospital Area: [facility] [[id number]]  Level of Care: Telemetry Medical [104]  May admit patient to [facility] or [facility] if equivalent level of care is available:: No  Covid Evaluation: Asymptomatic Screening Protocol (No Symptoms)  Diagnosis: Community acquired pneumonia [272536]  Admitting Physician: Norval Morton [[id number]]  Attending Physician: Norval Morton [[id number]]  Estimated length of stay: past midnight tomorrow  Certification:: I certify this patient will need inpatient services for at least 2 midnights          B Medical/Surgery History Past Medical History:  Diagnosis Date   Breast cancer ([place]) 2001   Left   Diabetes mellitus type 2, uncomplicated ([place])    Personal history of chemotherapy    Personal history of radiation therapy    Pure hypercholesterolemia    Unspecified disorder of thyroid    Unspecified essential hypertension    Past Surgical History:  Procedure Laterality Date   BREAST EXCISIONAL BIOPSY Left    BREAST LUMPECTOMY Left 2001    THYROIDECTOMY     total     A IV Location/Drains/Wounds Patient Lines/Drains/Airways Status     Active Line/Drains/Airways     Name Placement date Placement time Site Days   Peripheral IV 10/02/21 20 G 1.16" Anterior;Right Wrist 10/02/21  1017  Wrist  less than 1            Intake/Output Last 24 hours  Intake/Output Summary (Last 24 hours) at 10/02/2021 1806 Last data filed at 10/02/2021 1441 Gross per 24 hour  Intake 450 ml  Output --  Net 450 ml    Labs/Imaging Results for orders placed or performed during the hospital encounter of 10/02/21 (from the past 48 hour(s))  Resp Panel by RT-PCR (Flu A&B, Covid) Nasopharyngeal Swab     Status: None   Collection Time: 10/02/21  9:45 AM   Specimen: Nasopharyngeal Swab; Nasopharyngeal(NP) swabs in vial transport medium  Result Value Ref Range   SARS Coronavirus 2 by RT PCR NEGATIVE NEGATIVE    Comment: (NOTE) SARS-CoV-2 target nucleic acids are NOT DETECTED.  The SARS-CoV-2 RNA is generally detectable in upper respiratory specimens during the acute phase of infection. The lowest concentration of SARS-CoV-2 viral copies this assay can detect is 138 copies/mL. A negative result does not preclude SARS-Cov-2 infection and should not be used as the sole basis for treatment or other patient management decisions. A negative result may occur with  improper specimen collection/handling, submission of specimen other than nasopharyngeal swab, presence of viral mutation(s) within the areas targeted by this assay,  and inadequate number of viral copies(<138 copies/mL). A negative result must be combined with clinical observations, patient history, and epidemiological information. The expected result is Negative.  Fact Sheet for Patients:  EntrepreneurPulse.com.au  Fact Sheet for Healthcare Providers:  IncredibleEmployment.be  This test is no t yet approved or cleared by the [place] FDA and   has been authorized for detection and/or diagnosis of SARS-CoV-2 by FDA under an Emergency Use Authorization (EUA). This EUA will remain  in effect (meaning this test can be used) for the duration of the COVID-19 declaration under Section 564(b)(1) of the Act, 21 U.S.C.section 360bbb-3(b)(1), unless the authorization is terminated  or revoked sooner.       Influenza A by PCR NEGATIVE NEGATIVE   Influenza B by PCR NEGATIVE NEGATIVE    Comment: (NOTE) The Xpert Xpress SARS-CoV-2/FLU/RSV plus assay is intended as an aid in the diagnosis of influenza from Nasopharyngeal swab specimens and should not be used as a sole basis for treatment. Nasal washings and aspirates are unacceptable for Xpert Xpress SARS-CoV-2/FLU/RSV testing.  Fact Sheet for Patients: EntrepreneurPulse.com.au  Fact Sheet for Healthcare Providers: IncredibleEmployment.be  This test is not yet approved or cleared by the [place] FDA and has been authorized for detection and/or diagnosis of SARS-CoV-2 by FDA under an Emergency Use Authorization (EUA). This EUA will remain in effect (meaning this test can be used) for the duration of the COVID-19 declaration under Section 564(b)(1) of the Act, 21 U.S.C. section 360bbb-3(b)(1), unless the authorization is terminated or revoked.  Performed at [facility], [place] [address] [place], [place] [address]   CBC with Differential     Status: Abnormal   Collection Time: 10/02/21  9:48 AM  Result Value Ref Range   WBC 12.9 (H) 4.0 - 10.5 K/uL   RBC 4.81 3.87 - 5.11 MIL/uL   Hemoglobin 15.4 (H) 12.0 - 15.0 g/dL   HCT 46.6 (H) 36.0 - 46.0 %   MCV 96.9 80.0 - 100.0 fL   MCH 32.0 26.0 - 34.0 pg   MCHC 33.0 30.0 - 36.0 g/dL   RDW 13.5 11.5 - 15.5 %   Platelets 183 150 - 400 K/uL   nRBC 0.0 0.0 - 0.2 %   Neutrophils Relative % 84 %   Neutro Abs 10.8 (H) 1.7 - 7.7 K/uL   Lymphocytes Relative 8 %   Lymphs Abs 1.0 0.7 - 4.0 K/uL    Monocytes Relative 6 %   Monocytes Absolute 0.8 0.1 - 1.0 K/uL   Eosinophils Relative 1 %   Eosinophils Absolute 0.1 0.0 - 0.5 K/uL   Basophils Relative 0 %   Basophils Absolute 0.1 0.0 - 0.1 K/uL   Immature Granulocytes 1 %   Abs Immature Granulocytes 0.09 (H) 0.00 - 0.07 K/uL    Comment: Performed at [facility] [address], [place], [place] [address]  Comprehensive metabolic panel     Status: Abnormal   Collection Time: 10/02/21  9:48 AM  Result Value Ref Range   Sodium 140 135 - 145 mmol/L   Potassium 3.4 (L) 3.5 - 5.1 mmol/L   Chloride 101 98 - 111 mmol/L   CO2 27 22 - 32 mmol/L   Glucose, Bld 166 (H) 70 - 99 mg/dL    Comment: Glucose reference range applies only to samples taken after fasting for at least 8 hours.   BUN 12 8 - 23 mg/dL   Creatinine, Ser 0.92 0.44 - 1.00 mg/dL   Calcium  9.4 8.9 - 10.3 mg/dL   Total Protein 7.0 6.5 - 8.1 g/dL   Albumin 3.7 3.5 - 5.0 g/dL   AST 24 15 - 41 U/L   ALT 22 0 - 44 U/L   Alkaline Phosphatase 61 38 - 126 U/L   Total Bilirubin 1.6 (H) 0.3 - 1.2 mg/dL   GFR, Estimated >60 >60 mL/min    Comment: (NOTE) Calculated using the CKD-EPI Creatinine Equation (2021)    Anion gap 12 5 - 15    Comment: Performed at [facility] [address], [place], [place] [address]  Brain natriuretic peptide     Status: None   Collection Time: 10/02/21  9:48 AM  Result Value Ref Range   B Natriuretic Peptide 100.0 0.0 - 100.0 pg/mL    Comment: Performed at [facility] [address], [place], [place] [address]  Troponin I (High Sensitivity)     Status: None   Collection Time: 10/02/21  9:48 AM  Result Value Ref Range   Troponin I (High Sensitivity) 7 <18 ng/L    Comment: (NOTE) Elevated high sensitivity troponin I (hsTnI) values and significant  changes across serial measurements may suggest ACS but many other  chronic and acute conditions are known to elevate hsTnI results.  Refer to the "Links" section for chest pain  algorithms and additional  guidance. Performed at [facility], [place] [address], [place], [place] [address]   Urinalysis, Routine w reflex microscopic Urine, Clean Catch     Status: Abnormal   Collection Time: 10/02/21 11:46 AM  Result Value Ref Range   Color, Urine YELLOW YELLOW   APPearance HAZY (A) CLEAR   Specific Gravity, Urine 1.011 1.005 - 1.030   pH 5.0 5.0 - 8.0   Glucose, UA NEGATIVE NEGATIVE mg/dL   Hgb urine dipstick NEGATIVE NEGATIVE   Bilirubin Urine NEGATIVE NEGATIVE   Ketones, ur 5 (A) NEGATIVE mg/dL   Protein, ur 100 (A) NEGATIVE mg/dL   Nitrite POSITIVE (A) NEGATIVE   Leukocytes,Ua MODERATE (A) NEGATIVE   RBC / HPF 0-5 0 - 5 RBC/hpf   WBC, UA 6-10 0 - 5 WBC/hpf   Bacteria, UA RARE (A) NONE SEEN   Squamous Epithelial / LPF 0-5 0 - 5   Hyaline Casts, UA PRESENT    Granular Casts, UA PRESENT    Amorphous Crystal PRESENT     Comment: Performed at [facility], [address] [place], [place] [address]  Troponin I (High Sensitivity)     Status: None   Collection Time: 10/02/21  3:27 PM  Result Value Ref Range   Troponin I (High Sensitivity) 10 <18 ng/L    Comment: (NOTE) Elevated high sensitivity troponin I (hsTnI) values and significant  changes across serial measurements may suggest ACS but many other  chronic and acute conditions are known to elevate hsTnI results.  Refer to the "Links" section for chest pain algorithms and additional  guidance. Performed at [facility], [place] [address], [place], [place] [address]   D-dimer, quantitative     Status: Abnormal   Collection Time: 10/02/21  4:54 PM  Result Value Ref Range   D-Dimer, Quant 1.65 (H) 0.00 - 0.50 ug/mL-FEU    Comment: (NOTE) At the manufacturer cut-off value of 0.5 g/mL FEU, this assay has a negative predictive value of 95-100%.This assay is intended for use in conjunction with a clinical pretest probability (PTP) assessment model to exclude pulmonary embolism (PE) and  deep  venous thrombosis (DVT) in outpatients suspected of PE or DVT. Results should be correlated with clinical presentation. Performed at [facility], [place] [address], [place], [place] [address]   Procalcitonin     Status: None   Collection Time: 10/02/21  4:54 PM  Result Value Ref Range   Procalcitonin 0.46 ng/mL    Comment:        Interpretation: PCT (Procalcitonin) <= 0.5 ng/mL: Systemic infection (sepsis) is not likely. Local bacterial infection is possible. (NOTE)       Sepsis PCT Algorithm           Lower Respiratory Tract                                      Infection PCT Algorithm    ----------------------------     ----------------------------         PCT < 0.25 ng/mL                PCT < 0.10 ng/mL          Strongly encourage             Strongly discourage   discontinuation of antibiotics    initiation of antibiotics    ----------------------------     -----------------------------       PCT 0.25 - 0.50 ng/mL            PCT 0.10 - 0.25 ng/mL               OR       >80% decrease in PCT            Discourage initiation of                                            antibiotics      Encourage discontinuation           of antibiotics    ----------------------------     -----------------------------         PCT >= 0.50 ng/mL              PCT 0.26 - 0.50 ng/mL               AND        <80% decrease in PCT             Encourage initiation of                                             antibiotics       Encourage continuation           of antibiotics    ----------------------------     -----------------------------        PCT >= 0.50 ng/mL                  PCT > 0.50 ng/mL               AND         increase in PCT                  Strongly encourage  initiation of antibiotics    Strongly encourage escalation           of antibiotics                                     -----------------------------                                            PCT <= 0.25 ng/mL                                                 OR                                        > 80% decrease in PCT                                      Discontinue / Do not initiate                                             antibiotics  Performed at [facility], [address] [place], [place] [address]    DG Chest 2 View  Result Date: 10/02/2021 CLINICAL DATA:  Shortness of breath. EXAM: CHEST - 2 VIEW COMPARISON:  Chest x-ray dated May 23, 2017. FINDINGS: Stable cardiomediastinal silhouette new interstitial thickening and hazy airspace opacities at the left lung base. No pleural effusion or pneumothorax. No acute osseous abnormality. IMPRESSION: 1. New left lower lobe pneumonia. Atypical infection is a consideration. Electronically Signed   By: Titus Dubin M.D.   On: 10/02/2021 10:05    Pending Labs Unresulted Labs (From admission, onward)     Start     Ordered   10/03/21 0500  Comprehensive metabolic panel  Tomorrow morning,   R        10/02/21 1216   10/03/21 0500  CBC  Tomorrow morning,   R        10/02/21 1216   10/03/21 0500  Procalcitonin  Daily,   R      10/02/21 1216   10/03/21 0500  Magnesium  Tomorrow morning,   R        10/02/21 1237   10/03/21 0500  Phosphorus  Tomorrow morning,   R        10/02/21 1421   10/02/21 1429  Urine Culture  Add-on,   AD       Question:  Indication  Answer:  Urgency/frequency   10/02/21 1428   10/02/21 1217  Strep pneumoniae urinary antigen  (COPD / Pneumonia / Cellulitis / Lower Extremity Wound)  Once,   R        10/02/21 1216   10/02/21 1217  Legionella Pneumophila Serogp 1 Ur Ag  (COPD / Pneumonia / Cellulitis / Lower Extremity Wound)  Once,   R        10/02/21  1216            Vitals/Pain Today's Vitals   10/02/21 1530 10/02/21 1644 10/02/21 1645 10/02/21 1805  BP: 107/63 97/70  96/75  Pulse: 61 (!) 59  61  Resp: '18 20  20  '$ Temp:  98.7 F (37.1 C)  97.8 F (36.6 C)  TempSrc:  Oral   Oral  SpO2: 96% 98%  95%  PainSc:  0-No pain 0-No pain 0-No pain    Isolation Precautions No active isolations  Medications Medications  nadolol (CORGARD) tablet 20 mg (20 mg Oral Given 10/02/21 1440)  hydrALAZINE (APRESOLINE) injection 10 mg (has no administration in time range)  rosuvastatin (CRESTOR) tablet 10 mg (10 mg Oral Given 10/02/21 1344)  levothyroxine (SYNTHROID) tablet 88 mcg (has no administration in time range)  fluticasone (FLONASE) 50 MCG/ACT nasal spray 2 spray (has no administration in time range)  loratadine (CLARITIN) tablet 10 mg (10 mg Oral Given 10/02/21 1345)  guaiFENesin (MUCINEX) 12 hr tablet 600 mg (600 mg Oral Given 10/02/21 1346)  escitalopram (LEXAPRO) tablet 10 mg (10 mg Oral Given 10/02/21 1344)  traZODone (DESYREL) tablet 25 mg (has no administration in time range)  enoxaparin (LOVENOX) injection 40 mg (40 mg Subcutaneous Given 10/02/21 1406)  sodium chloride flush (NS) 0.9 % injection 3 mL (3 mLs Intravenous Not Given 10/02/21 1310)  ondansetron (ZOFRAN) tablet 4 mg (has no administration in time range)    Or  ondansetron (ZOFRAN) injection 4 mg (has no administration in time range)  acetaminophen (TYLENOL) tablet 650 mg (has no administration in time range)    Or  acetaminophen (TYLENOL) suppository 650 mg (has no administration in time range)  albuterol (PROVENTIL) (2.5 MG/3ML) 0.083% nebulizer solution 2.5 mg (has no administration in time range)  cefTRIAXone (ROCEPHIN) 2 g in sodium chloride 0.9 % 100 mL IVPB (has no administration in time range)  azithromycin (ZITHROMAX) tablet 500 mg (has no administration in time range)  butalbital-acetaminophen-caffeine (FIORICET) 50-325-40 MG per tablet 1 tablet (1 tablet Oral Given 10/02/21 1440)  LORazepam (ATIVAN) tablet 1-4 mg (has no administration in time range)    Or  LORazepam (ATIVAN) injection 1-4 mg (has no administration in time range)  thiamine tablet 100 mg (100 mg Oral Given 10/02/21 1453)    Or   thiamine (B-1) injection 100 mg ( Intravenous See Alternative 5/83/09 4076)  folic acid (FOLVITE) tablet 1 mg (1 mg Oral Given 10/02/21 1452)  multivitamin with minerals tablet 1 tablet (1 tablet Oral Given 10/02/21 1452)  acetaminophen (TYLENOL) tablet 1,000 mg (1,000 mg Oral Given 10/02/21 1027)  cefTRIAXone (ROCEPHIN) 1 g in sodium chloride 0.9 % 100 mL IVPB (0 g Intravenous Stopped 10/02/21 1131)  azithromycin (ZITHROMAX) 500 mg in sodium chloride 0.9 % 250 mL IVPB (0 mg Intravenous Stopped 10/02/21 1348)  cefTRIAXone (ROCEPHIN) 1 g in sodium chloride 0.9 % 100 mL IVPB (0 g Intravenous Stopped 10/02/21 1441)  potassium chloride SA (KLOR-CON M) CR tablet 20 mEq (20 mEq Oral Given 10/02/21 1345)    Mobility walks Low fall risk      R Recommendations: See Admitting Provider Note  Report given to:   Additional Notes: currently on 4L via [place]

## 2021-10-02 NOTE — Assessment & Plan Note (Signed)
Acute.  Urinalysis was positive for moderate leukocytes, positive nitrates, rare bacteria, and 6-10 WBCs. ?-Check urine culture ?-Adequate coverage with antibiotics for pneumonia. ?

## 2021-10-02 NOTE — Assessment & Plan Note (Signed)
Patient presented with complaints of cough and was noted to have mild tachypnea.  Found to be hypoxic down to 87% on room air with improvement on 2 L of nasal cannula oxygen.  Chest x-ray noting a left lower lobe opacity concerning for pneumonia.  She had been given Rocephin and azithromycin IV. ?-Admit to a medical telemetry bed ?-Continuous pulse oximetry with nasal cannula oxygen maintain O2 saturation greater than 92% ?-Incentive spirometry and flutter valve ?-Check procalcitonin ?-Continue empiric antibiotics of Rocephin and azithromycin ?-Mucinex ? ?

## 2021-10-02 NOTE — ED Triage Notes (Signed)
Patient here with complaint of general malaise, shorntess of breath, and chest pressure for the last week. Does not wear supplemental O2 at baseline, room air SpO2 84% while talking, 90% when silent, placed on 2L O2 [place]. Patient speaking in complete sentences, afebrile in triage, and in no apparent distress at this time. ?

## 2021-10-02 NOTE — Assessment & Plan Note (Signed)
Acute.  Potassium 3.4 on admission. ?-Give 20 mEq of potassium chloride p.o. ?-Continue to monitor and replace as needed ? ? ?

## 2021-10-02 NOTE — Assessment & Plan Note (Signed)
On admission into the emergency department patient blood pressure was noted to be elevated up to 190/106.  Home blood pressure regimen includes nadolol 20 mg daily. ?-Continue nadolol ?-Hydralazine IV as needed for elevated blood pressure ?

## 2021-10-02 NOTE — ED Provider Triage Note (Signed)
Emergency Medicine Provider Triage Evaluation Note ? ?Kim Singh , a 81 y.o. female  was evaluated in triage.  Pt complains of not feeling well since Thursday.  She says that she has had chills, sweats, feeling feverish, feeling short of breath and congestion.  Says that she has been taking Mucinex and cough syrup.  History of hypertension however denies history of heart failure, COPD.  Does not smoke. ? ? ?Review of Systems  ?Positive: As above ?Negative: Chest pain ? ?Physical Exam  ?BP (!) 190/106   Pulse 86   Temp (!) 97.4 ?F (36.3 ?C) (Oral)   Resp 16   SpO2 (!) 87%  ?Gen:   Awake, no distress   ?Resp:  Normal effort  ?MSK:   Moves extremities without difficulty  ?Other:  Rhonchi on right lung fields.  Satting on 87 on room air, no history of oxygen use ? ?Medical Decision Making  ?Medically screening exam initiated at 9:45 AM.  Appropriate orders placed.  Micalah Cabezas was informed that the remainder of the evaluation will be completed by another provider, this initial triage assessment does not replace that evaluation, and the importance of remaining in the ED until their evaluation is complete. ? ? ?Nursing knows that patient needs a room ?  ?Rhae Hammock, PA-C ?10/02/21 [id number] ? ?

## 2021-10-03 ENCOUNTER — Inpatient Hospital Stay (HOSPITAL_COMMUNITY): Payer: Medicare Other

## 2021-10-03 DIAGNOSIS — J189 Pneumonia, unspecified organism: Secondary | ICD-10-CM | POA: Diagnosis not present

## 2021-10-03 DIAGNOSIS — A419 Sepsis, unspecified organism: Secondary | ICD-10-CM

## 2021-10-03 LAB — COMPREHENSIVE METABOLIC PANEL
ALT: 19 U/L (ref 0–44)
AST: 21 U/L (ref 15–41)
Albumin: 3 g/dL — ABNORMAL LOW (ref 3.5–5.0)
Alkaline Phosphatase: 49 U/L (ref 38–126)
Anion gap: 7 (ref 5–15)
BUN: 11 mg/dL (ref 8–23)
CO2: 31 mmol/L (ref 22–32)
Calcium: 8.5 mg/dL — ABNORMAL LOW (ref 8.9–10.3)
Chloride: 101 mmol/L (ref 98–111)
Creatinine, Ser: 0.73 mg/dL (ref 0.44–1.00)
GFR, Estimated: >60 mL/min (ref >60)
Glucose, Bld: 96 mg/dL (ref 70–99)
Potassium: 3.4 mmol/L — ABNORMAL LOW (ref 3.5–5.1)
Sodium: 139 mmol/L (ref 135–145)
Total Bilirubin: 1 mg/dL (ref 0.3–1.2)
Total Protein: 5.9 g/dL — ABNORMAL LOW (ref 6.5–8.1)

## 2021-10-03 LAB — MAGNESIUM: Magnesium: 1.6 mg/dL — ABNORMAL LOW (ref 1.7–2.4)

## 2021-10-03 LAB — GLUCOSE, CAPILLARY
Glucose-Capillary: 113 mg/dL — ABNORMAL HIGH (ref 70–99)
Glucose-Capillary: 145 mg/dL — ABNORMAL HIGH (ref 70–99)
Glucose-Capillary: 105 mg/dL — ABNORMAL HIGH (ref 70–99)
Glucose-Capillary: 94 mg/dL (ref 70–99)

## 2021-10-03 LAB — CBC
HCT: 36 % (ref 36.0–46.0)
Hemoglobin: 12.1 g/dL (ref 12.0–15.0)
MCH: 32.6 pg (ref 26.0–34.0)
MCHC: 33.6 g/dL (ref 30.0–36.0)
MCV: 97 fL (ref 80.0–100.0)
Platelets: 139 10*3/uL — ABNORMAL LOW (ref 150–400)
RBC: 3.71 MIL/uL — ABNORMAL LOW (ref 3.87–5.11)
RDW: 13.6 % (ref 11.5–15.5)
WBC: 12.5 10*3/uL — ABNORMAL HIGH (ref 4.0–10.5)
nRBC: 0 % (ref 0.0–0.2)

## 2021-10-03 LAB — PROCALCITONIN: Procalcitonin: 0.67 ng/mL

## 2021-10-03 LAB — PHOSPHORUS: Phosphorus: 2.9 mg/dL (ref 2.5–4.6)

## 2021-10-03 MED ORDER — POTASSIUM CHLORIDE CRYS ER 20 MEQ PO TBCR
40.0000 meq | EXTENDED_RELEASE_TABLET | Freq: Once | ORAL | Status: AC
  Administered 2021-10-03: 40 meq via ORAL
  Filled 2021-10-03: qty 2

## 2021-10-03 MED ORDER — INSULIN ASPART 100 UNIT/ML IJ SOLN: 0.0000 [IU] | Freq: Every day | INTRAMUSCULAR | Status: DC

## 2021-10-03 MED ORDER — MAGNESIUM SULFATE 2 GM/50ML IV SOLN
2.0000 g | Freq: Once | INTRAVENOUS | Status: AC
  Administered 2021-10-03: 2 g via INTRAVENOUS
  Filled 2021-10-03: qty 50

## 2021-10-03 MED ORDER — IOHEXOL 350 MG/ML SOLN
100.0000 mL | Freq: Once | INTRAVENOUS | Status: AC | PRN
  Administered 2021-10-03: 100 mL via INTRAVENOUS

## 2021-10-03 MED ORDER — INSULIN ASPART 100 UNIT/ML IJ SOLN: 0.0000 [IU] | Freq: Three times a day (TID) | INTRAMUSCULAR | Status: DC

## 2021-10-03 NOTE — Progress Notes (Signed)
?  Transition of Care (TOC) Screening Note ? ? ?Patient Details  ?Name: Kim Singh ?Date of Birth: 1940-10-01 ? ? ?Transition of Care (TOC) CM/SW Contact:    ?Benard Halsted, LCSW ?Phone Number: ?10/03/2021, 8:49 AM ? ? ? ?Transition of Care Department [facility]) has reviewed patient and no TOC needs have been identified at this time. We will continue to monitor patient advancement through interdisciplinary progression rounds. If new patient transition needs arise, please place a TOC consult. ? ? ?

## 2021-10-03 NOTE — Progress Notes (Addendum)
?PROGRESS NOTE ? ? ? Kim Singh  DQQ:[id number] DOB: 01/03/1941 DOA: 10/02/2021 ?PCP: Marin Olp, MD ? ? ?Brief Narrative:  ?HPI: Kim Singh is a 81 y.o. female with medical history significant of hypertension, hyperlipidemia, hypothyroidism, neuropathy, breast cancer and diabetes mellitus type 2 presents with complaints of shortness of breath and cough.  She reports having a 4-day history of having a productive cough.  Patient has been swallowing sputum and does not normally spit it out.  Notes associated symptoms of headache and nausea.  This morning she woke up this morning unable to catch her breath, chest discomfort, and chills with shakes.  She chronically has issues with neuropathy that are unchanged.  At home patient had been using over-the-counter medications for cough symptoms.  She reports having some mild increase in urinary frequency, but attributed to increased fluid intake.  Denies any fever, leg pain, leg swelling, vomiting, diarrhea, dysuria, prolonged immobility, or recent sick contacts to her knowledge. At baseline she is not on oxygen.  She reports having approximately at least 39 smoking pack-year history of tobacco, but quit in 1999.  She drinks a glass of wine per night most nights, but denies any history of withdrawals. ?  ?Upon admission to the emergency department patient patient was noted to be afebrile with blood pressure elevated up to 190/106, O2 saturations as low as 87% with improvement on 2 L nasal cannula oxygen.  Labs significant for WBC 12.9, hemoglobin 15.4, potassium 3.4, glucose 166, and total bilirubin 1.6.  Chest x-ray noted a new left lower lobe opacity concerning for pneumonia.  Patient has been given Tylenol, Rocephin, and azithromycin. ? ?Assessment & Plan: ?  ?Principal Problem: ?  Acute respiratory failure with hypoxia secondary to community acquired pneumonia ?Active Problems: ?  Acute respiratory failure with hypoxia ([place]) ?  Hypothyroidism ?  Type II  diabetes mellitus with neurological manifestations ([place]) ?  Hyperbilirubinemia ?  Hypokalemia ?  Hypertensive urgency ?  UTI (urinary tract infection) ? ?Sepsis as well as acute hypoxic respiratory failure secondary to community-acquired pneumonia, POA: Patient met sepsis criteria based on tachypnea and leukocytosis.  She states that she feels overall better than yesterday.  She still has dry cough.  She is still requiring 4 L of oxygen.  Her D-dimer was elevated which raises suspicion for possible PE although my clinical suspicion is low however I will proceed with CT angiogram of the chest.  Continue Rocephin and Zithromax.  Follow culture which are negative so far.  Wean oxygen as able to.  Check a sputum culture. ? ?UTI: Continue Rocephin.  Follow culture. ? ?Type 2 diabetes mellitus: Diet controlled.  Hemoglobin A1c 6.6.  Start on SSI. ? ?Hypertensive urgency: On admission into the emergency department patient blood pressure was noted to be elevated up to 190/106.  She is on home medication which is nadolol and blood pressure is very well controlled. ? ?Hypomagnesemia/hypokalemia: Replaced again today. ? ?Hypothyroidism: Continue Synthroid. ? ?Hyperlipidemia: Continue Crestor. ? ?Hyperbilirubinemia: Resolved. ? ?Chronic alcohol dependence: She reports that she normally drinks a glass or 2 of the wine on most nights.  She has no withdrawal symptoms but she has been started on CIWA protocol. ? ?DVT prophylaxis: enoxaparin (LOVENOX) injection 40 mg Start: 10/02/21 1245 ?  Code Status: DNR  ?Family Communication:  None present at bedside.  Plan of care discussed with patient in length and he/she verbalized understanding and agreed with it. ? ?Status is: Inpatient ?Remains inpatient appropriate because: Still hypoxic and  requires IV antibiotics. ? ? ?Estimated body mass index is 25.33 kg/m? as calculated from the following: ?  Height as of 09/05/21: 5' 8"  (1.727 m). ?  Weight as of 09/05/21: 75.6 kg. ? ?  ?Nutritional  Assessment: ?There is no height or weight on file to calculate BMI.Marland Kitchen ?Seen by dietician.  I agree with the assessment and plan as outlined below: ?Nutrition Status: ?  ?  ?  ? ?. ?Skin Assessment: ?I have examined the patient's skin and I agree with the wound assessment as performed by the wound care RN as outlined below: ?  ? ?Consultants:  ?None ? ?Procedures:  ?None ? ?Antimicrobials:  ?Anti-infectives (From admission, onward)  ? ? Start     Dose/Rate Route Frequency Ordered Stop  ? 10/03/21 1000  cefTRIAXone (ROCEPHIN) 2 g in sodium chloride 0.9 % 100 mL IVPB       ? 2 g ?200 mL/hr over 30 Minutes Intravenous Every 24 hours 10/02/21 1216 10/07/21 0959  ? 10/03/21 1000  azithromycin (ZITHROMAX) tablet 500 mg       ? 500 mg Oral Daily 10/02/21 1216 10/07/21 0959  ? 10/02/21 1215  cefTRIAXone (ROCEPHIN) 1 g in sodium chloride 0.9 % 100 mL IVPB       ? 1 g ?200 mL/hr over 30 Minutes Intravenous  Once 10/02/21 1207 10/02/21 1441  ? 10/02/21 1030  cefTRIAXone (ROCEPHIN) 1 g in sodium chloride 0.9 % 100 mL IVPB       ? 1 g ?200 mL/hr over 30 Minutes Intravenous  Once 10/02/21 1026 10/02/21 1131  ? 10/02/21 1030  azithromycin (ZITHROMAX) 500 mg in sodium chloride 0.9 % 250 mL IVPB       ? 500 mg ?250 mL/hr over 60 Minutes Intravenous  Once 10/02/21 1026 10/02/21 1348  ? ?  ?  ? ? ?Subjective: ?Patient seen and examined.  She states that her breathing is slightly better than yesterday however she is still on 4 L of oxygen.  She complains of dry cough.  No other complaint. ? ?Objective: ?Vitals:  ? 10/02/21 2030 10/02/21 2338 10/03/21 0359 10/03/21 0400  ?BP: 90/74 (!) 114/52 113/61 113/61  ?Pulse: 63 60 60 62  ?Resp: 15 15 16    ?Temp: 98.5 ?F (36.9 ?C) (!) 97.5 ?F (36.4 ?C) 98 ?F (36.7 ?C)   ?TempSrc: Oral Oral Oral   ?SpO2: 93% 96% 98%   ? ? ?Intake/Output Summary (Last 24 hours) at 10/03/2021 0801 ?Last data filed at 10/02/2021 2100 ?Gross per 24 hour  ?Intake 930 ml  ?Output 150 ml  ?Net 780 ml  ? ?There were no vitals  filed for this visit. ? ?Examination: ? ?General exam: Appears calm and comfortable  ?Respiratory system: Clear to auscultation. Respiratory effort normal. ?Cardiovascular system: S1 & S2 heard, RRR. No JVD, murmurs, rubs, gallops or clicks. No pedal edema. ?Gastrointestinal system: Abdomen is nondistended, soft and nontender. No organomegaly or masses felt. Normal bowel sounds heard. ?Central nervous system: Alert and oriented. No focal neurological deficits. ?Extremities: Symmetric 5 x 5 power. ?Skin: No rashes, lesions or ulcers ?Psychiatry: Judgement and insight appear normal. Mood & affect appropriate.  ? ? ?Data Reviewed: I have personally reviewed following labs and imaging studies ? ?CBC: ?Recent Labs  ?Lab 10/02/21 ?1610 10/03/21 ?0107  ?WBC 12.9* 12.5*  ?NEUTROABS 10.8*  --   ?HGB 15.4* 12.1  ?HCT 46.6* 36.0  ?MCV 96.9 97.0  ?PLT 183 139*  ? ?Basic Metabolic Panel: ?Recent Labs  ?Lab 10/02/21 ?  4353 10/03/21 ?0107  ?NA 140 139  ?K 3.4* 3.4*  ?CL 101 101  ?CO2 27 31  ?GLUCOSE 166* 96  ?BUN 12 11  ?CREATININE 0.92 0.73  ?CALCIUM 9.4 8.5*  ?MG  --  1.6*  ?PHOS  --  2.9  ? ?GFR: ?CrCl cannot be calculated (Unknown ideal weight.). ?Liver Function Tests: ?Recent Labs  ?Lab 10/02/21 ?9122 10/03/21 ?0107  ?AST 24 21  ?ALT 22 19  ?ALKPHOS 61 49  ?BILITOT 1.6* 1.0  ?PROT 7.0 5.9*  ?ALBUMIN 3.7 3.0*  ? ?No results for input(s): LIPASE, AMYLASE in the last 168 hours. ?No results for input(s): AMMONIA in the last 168 hours. ?Coagulation Profile: ?No results for input(s): INR, PROTIME in the last 168 hours. ?Cardiac Enzymes: ?No results for input(s): CKTOTAL, CKMB, CKMBINDEX, TROPONINI in the last 168 hours. ?BNP (last 3 results) ?No results for input(s): PROBNP in the last 8760 hours. ?HbA1C: ?No results for input(s): HGBA1C in the last 72 hours. ?CBG: ?Recent Labs  ?Lab 10/02/21 ?2110  ?GLUCAP 156*  ? ?Lipid Profile: ?No results for input(s): CHOL, HDL, LDLCALC, TRIG, CHOLHDL, LDLDIRECT in the last 72 hours. ?Thyroid  Function Tests: ?No results for input(s): TSH, T4TOTAL, FREET4, T3FREE, THYROIDAB in the last 72 hours. ?Anemia Panel: ?No results for input(s): VITAMINB12, FOLATE, FERRITIN, TIBC, IRON, RETICCTPCT in the

## 2021-10-04 LAB — CBC WITH DIFFERENTIAL/PLATELET
Abs Immature Granulocytes: 0.03 10*3/uL (ref 0.00–0.07)
Basophils Absolute: 0 10*3/uL (ref 0.0–0.1)
Basophils Relative: 0 %
Eosinophils Absolute: 0.3 10*3/uL (ref 0.0–0.5)
Eosinophils Relative: 4 %
HCT: 35.9 % — ABNORMAL LOW (ref 36.0–46.0)
Hemoglobin: 12.3 g/dL (ref 12.0–15.0)
Immature Granulocytes: 0 %
Lymphocytes Relative: 21 %
Lymphs Abs: 1.4 10*3/uL (ref 0.7–4.0)
MCH: 33.2 pg (ref 26.0–34.0)
MCHC: 34.3 g/dL (ref 30.0–36.0)
MCV: 96.8 fL (ref 80.0–100.0)
Monocytes Absolute: 0.9 10*3/uL (ref 0.1–1.0)
Monocytes Relative: 13 %
Neutro Abs: 4.3 10*3/uL (ref 1.7–7.7)
Neutrophils Relative %: 62 %
Platelets: 156 10*3/uL (ref 150–400)
RBC: 3.71 MIL/uL — ABNORMAL LOW (ref 3.87–5.11)
RDW: 13.3 % (ref 11.5–15.5)
WBC: 7 10*3/uL (ref 4.0–10.5)
nRBC: 0 % (ref 0.0–0.2)

## 2021-10-04 LAB — COMPREHENSIVE METABOLIC PANEL
ALT: 19 U/L (ref 0–44)
AST: 20 U/L (ref 15–41)
Albumin: 3.1 g/dL — ABNORMAL LOW (ref 3.5–5.0)
Alkaline Phosphatase: 45 U/L (ref 38–126)
Anion gap: 11 (ref 5–15)
BUN: 7 mg/dL — ABNORMAL LOW (ref 8–23)
CO2: 29 mmol/L (ref 22–32)
Calcium: 8.7 mg/dL — ABNORMAL LOW (ref 8.9–10.3)
Chloride: 99 mmol/L (ref 98–111)
Creatinine, Ser: 0.65 mg/dL (ref 0.44–1.00)
GFR, Estimated: >60 mL/min (ref >60)
Glucose, Bld: 91 mg/dL (ref 70–99)
Potassium: 3.7 mmol/L (ref 3.5–5.1)
Sodium: 139 mmol/L (ref 135–145)
Total Bilirubin: 0.9 mg/dL (ref 0.3–1.2)
Total Protein: 5.9 g/dL — ABNORMAL LOW (ref 6.5–8.1)

## 2021-10-04 LAB — LEGIONELLA PNEUMOPHILA SEROGP 1 UR AG: L. pneumophila Serogp 1 Ur Ag: NEGATIVE

## 2021-10-04 LAB — GLUCOSE, CAPILLARY
Glucose-Capillary: 108 mg/dL — ABNORMAL HIGH (ref 70–99)
Glucose-Capillary: 114 mg/dL — ABNORMAL HIGH (ref 70–99)

## 2021-10-04 LAB — URINE CULTURE: Culture: NO GROWTH

## 2021-10-04 LAB — MAGNESIUM: Magnesium: 1.9 mg/dL (ref 1.7–2.4)

## 2021-10-04 LAB — PROCALCITONIN: Procalcitonin: 0.44 ng/mL

## 2021-10-04 MED ORDER — CEFDINIR 300 MG PO CAPS: 300.0000 mg | ORAL_CAPSULE | Freq: Two times a day (BID) | ORAL | 0 refills | Status: AC

## 2021-10-04 NOTE — Discharge Summary (Signed)
PatientPhysician Discharge Summary  ?[name] HYW:[id number] DOB: [birth year] DOA: 10/02/2021 ? ?PCP: Marin Olp, MD ? ?Admit date: 10/02/2021 ?Discharge date: 10/04/2021 ?30 Day Unplanned Readmission Risk Score   ? ?Flowsheet Row ED to Hosp-Admission (Current) from 10/02/2021 in [facility]  ?30 Day Unplanned Readmission Risk Score (%) 11.5 Filed at 10/04/2021 0801  ? ?  ? ? This score is the patient's risk of an unplanned readmission within 30 days of being discharged (0 -100%). The score is based on dignosis, age, lab data, medications, orders, and past utilization.   ?Low:  0-14.9   Medium: 15-21.9   High: 22-29.9   Extreme: 30 and above ? ?  ? ?  ? ? ? ?Admitted From: Home ?Disposition: Home ? ?Recommendations for Outpatient Follow-up:  ?Follow up with PCP in 1-2 weeks ?Please obtain BMP/CBC in one week ?Please follow up with your PCP on the following pending results: ?Unresulted Labs (From admission, onward)  ? ?  Start     Ordered  ? 10/03/21 1107  Expectorated Sputum Assessment w Gram Stain, Rflx to Resp Cult  Once,   R       ? 10/03/21 1106  ? 10/02/21 1217  Legionella Pneumophila Serogp 1 Ur Ag  (COPD / Pneumonia / Cellulitis / Lower Extremity Wound)  Once,   R       ? 10/02/21 1216  ? ?  ?  ? ?  ?  ? ? ?Home Health: None ?Equipment/Devices: None ? ?Discharge Condition: Stable ?CODE STATUS: Full code ?Diet recommendation: Cardiac ? ?Subjective: Seen and examined.  Breathing is better.  She still has some dry cough which is improving as well.  She is agreeable to go home.  Overall feels better. ? ?Brief/Interim Summary:  Kim Singh is a 81 y.o. female with medical history significant of hypertension, hyperlipidemia, hypothyroidism, neuropathy, breast cancer and diabetes mellitus type 2 presented with complaints of shortness of breath and cough for about 4 days.  ?  ?Upon admission to the emergency department patient patient was noted to be afebrile with blood pressure  elevated up to 190/106, O2 saturations as low as 87% with improvement on 2 L nasal cannula oxygen.  She was diagnosed with sepsis secondary to community-acquired pneumonia as well as acute hypoxic respiratory failure.  Details below.   ? ?Sepsis as well as acute hypoxic respiratory failure secondary to community-acquired pneumonia, POA: Patient met sepsis criteria based on tachypnea and leukocytosis.  She was given Rocephin and azithromycin in the ED which have been continued and she has received 3 doses so far.  She is feeling much better.  She has been weaned off of oxygen to room air and does not require oxygen even with exertion based on the ambulatory oximetry report.  She is comfortable and feels better so she is going to be discharged home.  I have prescribed her 4 more days of cefdinir.  All cultures remain negative. ? ?UTI: Culture remains negative.  On antibiotics as mentioned above. ?  ?Type 2 diabetes mellitus: Diet controlled. ?  ?Hypertensive urgency: On admission into the emergency department patient blood pressure was noted to be elevated up to 190/106.  She is on home medication which is nadolol and blood pressure is very well controlled. ?  ?Hypomagnesemia/hypokalemia: Resolved. ?  ?Hypothyroidism: Continue Synthroid. ?  ?Hyperlipidemia: Continue Crestor. ?  ?Hyperbilirubinemia: Resolved. ?  ?Chronic alcohol dependence: She reports that she normally drinks a glass or 2 of the wine  on most nights.  She has no withdrawal symptoms. ? ?Discharge plan was discussed with patient and/or family member and they verbalized understanding and agreed with it.  ?Discharge Diagnoses:  ?Principal Problem: ?  Acute respiratory failure with hypoxia secondary to community acquired pneumonia ?Active Problems: ?  Acute respiratory failure with hypoxia ([place]) ?  Hypothyroidism ?  Type II diabetes mellitus with neurological manifestations (Kim Singh) ?  Hyperbilirubinemia ?  Hypokalemia ?  Hypertensive urgency ?  UTI (urinary  tract infection) ?  Sepsis ([place]) ? ? ? ?Discharge Instructions ? ? ?Allergies as of 10/04/2021   ? ?   Reactions  ? Codeine Other (See Comments)  ? GI upset  ? ?  ? ?  ?Medication List  ?  ? ?TAKE these medications   ? ?CALCIUM-VITAMIN D PO ?Take 1 tablet by mouth 2 (two) times daily. ?  ?cefdinir 300 MG capsule ?Commonly known as: OMNICEF ?Take 1 capsule (300 mg total) by mouth 2 (two) times daily for 4 days. ?  ?cetirizine 5 MG tablet ?Commonly known as: ZYRTEC ?Take 1 tablet (5 mg total) by mouth daily. ?  ?dextromethorphan-guaiFENesin 30-600 MG 12hr tablet ?Commonly known as: [place] DM ?Take 1 tablet by mouth 2 (two) times daily as needed for cough. ?  ?escitalopram 10 MG tablet ?Commonly known as: LEXAPRO ?Take 1 tablet (10 mg total) by mouth daily. ?  ?fluticasone 50 MCG/ACT nasal spray ?Commonly known as: FLONASE ?Place 2 sprays into both nostrils daily. ?What changed:  ?when to take this ?reasons to take this ?  ?ibuprofen 200 MG tablet ?Commonly known as: ADVIL ?Take 400 mg by mouth every 6 (six) hours as needed for mild pain or headache. ?  ?ICAPS LUTEIN & ZEAXANTH (MVI) PO ?Take 1 tablet by mouth daily. ?  ?levothyroxine 88 MCG tablet ?Commonly known as: SYNTHROID ?Take 1 tablet (88 mcg total) by mouth daily. ?  ?multivitamin tablet ?Take 1 tablet by mouth daily. ?  ?nadolol 20 MG tablet ?Commonly known as: CORGARD ?TAKE 1 TABLET DAILY ?  ?nystatin ointment ?Commonly known as: MYCOSTATIN ?1 application. daily as needed (rash). ?  ?OMACOR PO ?Take 1 tablet by mouth 2 (two) times daily. ?  ?rosuvastatin 10 MG tablet ?Commonly known as: CRESTOR ?TAKE 1 TABLET DAILY ?  ?traZODone 50 MG tablet ?Commonly known as: DESYREL ?TAKE ONE-HALF (1/2) TABLET AT BEDTIME AS NEEDED FOR SLEEP ?What changed: See the new instructions. ?  ?vitamin B-12 1000 MCG tablet ?Commonly known as: CYANOCOBALAMIN ?Take 1,000 mcg by mouth once a week. Takes on Mondays ?  ? ?  ? ? Follow-up Information   ? ? Marin Olp, MD Follow up  in 1 week(s).   ?Specialty: Family Medicine ?Contact information: ?[address] ?[place] [id number] ?[phone] ? ? ?  ?  ? ?  ?  ? ?  ? ?Allergies  ?Allergen Reactions  ? Codeine Other (See Comments)  ?  GI upset  ? ? ?Consultations: None ? ? ?Procedures/Studies: ?DG Chest 2 View ? ?Result Date: 10/02/2021 ?CLINICAL DATA:  Shortness of breath. EXAM: CHEST - 2 VIEW COMPARISON:  Chest x-ray dated May 23, 2017. FINDINGS: Stable cardiomediastinal silhouette new interstitial thickening and hazy airspace opacities at the left lung base. No pleural effusion or pneumothorax. No acute osseous abnormality. IMPRESSION: 1. New left lower lobe pneumonia. Atypical infection is a consideration. Electronically Signed   By: Titus Dubin M.D.   On: 10/02/2021 10:05  ? ?CT Angio Chest Pulmonary Embolism (PE) W  or WO Contrast ? ?Result Date: 10/03/2021 ?CLINICAL DATA:  Elevated D-dimer.  Dry cough. EXAM: CT ANGIOGRAPHY CHEST WITH CONTRAST TECHNIQUE: Multidetector CT imaging of the chest was performed using the standard protocol during bolus administration of intravenous contrast. Multiplanar CT image reconstructions and MIPs were obtained to evaluate the vascular anatomy. RADIATION DOSE REDUCTION: This exam was performed according to the departmental dose-optimization program which includes automated exposure control, adjustment of the mA and/or kV according to patient size and/or use of iterative reconstruction technique. CONTRAST:  172m OMNIPAQUE IOHEXOL 350 MG/ML SOLN COMPARISON:  None. FINDINGS: Cardiovascular: Satisfactory opacification of the pulmonary arteries to the segmental level. No evidence of pulmonary embolism. Normal heart size. No pericardial effusion. Thoracic aortic atherosclerosis. Coronary artery atherosclerosis. Reflux of contrast into the IVC and hepatic veins. Right and left main pulmonary arteries are dilated measuring 2.7 cm in diameter. Ascending thoracic aorta measures 4 cm in greatest AP  diameter. Mediastinum/Nodes: No enlarged mediastinal, hilar, or axillary lymph nodes. Thyroid gland, trachea, and esophagus demonstrate no significant findings. Lungs/Pleura: Mild bilateral centrilobular em

## 2021-10-04 NOTE — Plan of Care (Signed)
Pt alert and oriented x 4. Med compliant. Pt questioned regarding elecrtrolyte powder from home. Provider msg from dayshift not response. Pt informed to have family bring so staff could assess the product. Pt received 1 dose of prn mediation tylenol for headache.  ?Problem: Activity: ?Goal: Ability to tolerate increased activity will improve ?Outcome: Progressing ?  ?Problem: Clinical Measurements: ?Goal: Ability to maintain a body temperature in the normal range will improve ?Outcome: Progressing ?  ?Problem: Respiratory: ?Goal: Ability to maintain adequate ventilation will improve ?Outcome: Progressing ?Goal: Ability to maintain a clear airway will improve ?Outcome: Progressing ?  ?Problem: Education: ?Goal: Knowledge of General Education information will improve ?Description: Including pain rating scale, medication(s)/side effects and non-pharmacologic comfort measures ?Outcome: Progressing ?  ?Problem: Health Behavior/Discharge Planning: ?Goal: Ability to manage health-related needs will improve ?Outcome: Progressing ?  ?Problem: Clinical Measurements: ?Goal: Ability to maintain clinical measurements within normal limits will improve ?Outcome: Progressing ?Goal: Diagnostic test results will improve ?Outcome: Progressing ?  ?Problem: Nutrition: ?Goal: Adequate nutrition will be maintained ?Outcome: Progressing ?  ?Problem: Elimination: ?Goal: Will not experience complications related to bowel motility ?Outcome: Progressing ?  ?Problem: Pain Managment: ?Goal: General experience of comfort will improve ?Outcome: Progressing ?  ?Problem: Safety: ?Goal: Ability to remain free from injury will improve ?Outcome: Progressing ?  ?Problem: Skin Integrity: ?Goal: Risk for impaired skin integrity will decrease ?Outcome: Progressing ?  ?

## 2021-10-04 NOTE — Progress Notes (Signed)
SATURATION QUALIFICATIONS: (This note is used to comply with regulatory documentation for home oxygen)  Patient Saturations on Room Air at Rest = 98%  Patient Saturations on Room Air while Ambulating = 92%   

## 2021-10-05 ENCOUNTER — Telehealth: Payer: Self-pay

## 2021-10-05 NOTE — Telephone Encounter (Signed)
Transition Care Management Follow-up Telephone Call ?Date of discharge and from where: [facility] 10/04/21 ?How have you been since you were released from the hospital? Getting better ?Any questions or concerns? No ? ?Items Reviewed: ?Did the pt receive and understand the discharge instructions provided? Yes  ?Medications obtained and verified? Yes  ?Other? No  ?Any new allergies since your discharge? No  ?Dietary orders reviewed? Yes ?Do you have support at home? Yes  ? ?Home Care and Equipment/Supplies: ?Were home health services ordered? not applicable ?If so, what is the name of the agency?   ?Has the agency set up a time to come to the patient's home? not applicable ?Were any new equipment or medical supplies ordered?  No ?What is the name of the medical supply agency?  ?Were you able to get the supplies/equipment? not applicable ?Do you have any questions related to the use of the equipment or supplies? No ? ?Functional Questionnaire: (I = Independent and D = Dependent) ?ADLs: I ? ?Bathing/Dressing- I ? ?Meal Prep- I ? ?Eating- I ? ?Maintaining continence- I ? ?Transferring/Ambulation- I ? ?Managing Meds- I ? ?Follow up appointments reviewed: ? ?PCP Hospital f/u appt confirmed? Yes  Scheduled to see Dr Yong Channel  on 10/09/21  @ 10:00. ?[facility] f/u appt confirmed? No   ?Are transportation arrangements needed? No  ?If their condition worsens, is the pt aware to call PCP or go to the Emergency Dept.? Yes ?Was the patient provided with contact information for the PCP's office or ED? Yes ?Was to pt encouraged to call back with questions or concerns? Yes ? ?

## 2021-10-09 ENCOUNTER — Encounter: Payer: Self-pay | Admitting: Family Medicine

## 2021-10-09 ENCOUNTER — Ambulatory Visit (INDEPENDENT_AMBULATORY_CARE_PROVIDER_SITE_OTHER): Payer: Medicare Other | Admitting: Family Medicine

## 2021-10-09 VITALS — BP 128/68 | HR 59 | Temp 97.6°F | Wt 167.8 lb

## 2021-10-09 DIAGNOSIS — Z8619 Personal history of other infectious and parasitic diseases: Secondary | ICD-10-CM

## 2021-10-09 DIAGNOSIS — E785 Hyperlipidemia, unspecified: Secondary | ICD-10-CM | POA: Diagnosis not present

## 2021-10-09 DIAGNOSIS — Z1283 Encounter for screening for malignant neoplasm of skin: Secondary | ICD-10-CM

## 2021-10-09 DIAGNOSIS — I7121 Aneurysm of the ascending aorta, without rupture: Secondary | ICD-10-CM | POA: Diagnosis not present

## 2021-10-09 DIAGNOSIS — E1149 Type 2 diabetes mellitus with other diabetic neurological complication: Secondary | ICD-10-CM

## 2021-10-09 DIAGNOSIS — I10 Essential (primary) hypertension: Secondary | ICD-10-CM

## 2021-10-09 DIAGNOSIS — J439 Emphysema, unspecified: Secondary | ICD-10-CM | POA: Insufficient documentation

## 2021-10-09 DIAGNOSIS — J189 Pneumonia, unspecified organism: Secondary | ICD-10-CM

## 2021-10-09 DIAGNOSIS — I700 Atherosclerosis of aorta: Secondary | ICD-10-CM | POA: Insufficient documentation

## 2021-10-09 LAB — CBC WITH DIFFERENTIAL/PLATELET
Basophils Absolute: 0.1 10*3/uL (ref 0.0–0.1)
Basophils Relative: 1 % (ref 0.0–3.0)
Eosinophils Absolute: 0.3 10*3/uL (ref 0.0–0.7)
Eosinophils Relative: 4 % (ref 0.0–5.0)
HCT: 42 % (ref 36.0–46.0)
Hemoglobin: 14.3 g/dL (ref 12.0–15.0)
Lymphocytes Relative: 23.4 % (ref 12.0–46.0)
Lymphs Abs: 1.6 10*3/uL (ref 0.7–4.0)
MCHC: 34.1 g/dL (ref 30.0–36.0)
MCV: 95.8 fl (ref 78.0–100.0)
Monocytes Absolute: 0.8 10*3/uL (ref 0.1–1.0)
Monocytes Relative: 11.6 % (ref 3.0–12.0)
Neutro Abs: 4 10*3/uL (ref 1.4–7.7)
Neutrophils Relative %: 60 % (ref 43.0–77.0)
Platelets: 273 10*3/uL (ref 150.0–400.0)
RBC: 4.39 Mil/uL (ref 3.87–5.11)
RDW: 13.3 % (ref 11.5–15.5)
WBC: 6.7 10*3/uL (ref 4.0–10.5)

## 2021-10-09 LAB — COMPREHENSIVE METABOLIC PANEL
ALT: 19 U/L (ref 0–35)
AST: 23 U/L (ref 0–37)
Albumin: 4.4 g/dL (ref 3.5–5.2)
Alkaline Phosphatase: 58 U/L (ref 39–117)
BUN: 15 mg/dL (ref 6–23)
CO2: 34 mEq/L — ABNORMAL HIGH (ref 19–32)
Calcium: 10.7 mg/dL — ABNORMAL HIGH (ref 8.4–10.5)
Chloride: 100 mEq/L (ref 96–112)
Creatinine, Ser: 0.91 mg/dL (ref 0.40–1.20)
GFR: 58.59 mL/min — ABNORMAL LOW (ref >60.00)
Glucose, Bld: 92 mg/dL (ref 70–99)
Potassium: 5.3 mEq/L — ABNORMAL HIGH (ref 3.5–5.1)
Sodium: 143 mEq/L (ref 135–145)
Total Bilirubin: 0.7 mg/dL (ref 0.2–1.2)
Total Protein: 7 g/dL (ref 6.0–8.3)

## 2021-10-09 LAB — LDL CHOLESTEROL, DIRECT: Direct LDL: 68 mg/dL

## 2021-10-09 NOTE — Telephone Encounter (Signed)
Noted thanks- glad we have close follow up/TCM visit ?

## 2021-10-09 NOTE — Assessment & Plan Note (Signed)
We discussed today that last LDL was 23 on Crestor 10 mg and Zetia 10 mg.  As a result we opted to stop her Zetia 10 mg and recheck today- stay on same meds unless LDL goes over 70 ?

## 2021-10-09 NOTE — Patient Instructions (Addendum)
Health Maintenance Due  ?Topic Date Due  ? COVID-19 Vaccine (4 - Booster for [place] series) 07/06/2020  ? OPHTHALMOLOGY EXAM  05/02/2021  ? ?Blood pressure looks good on nadolol 20 mg alone.  Had stopped quinapril in the past due to low blood pressures/orthostatic hypotension-encouraged her to check at least once or twice a week at home with goal less than 135/85 ? ?We will call you within two weeks about your referral to dermatology (you requested general referral for skin cancer screening). If you do not hear within 2 weeks, give [place] a call. ? ?Please stop by lab before you go ?If you have mychart- we will send your results within 3 business days of [place] receiving them.  ?If you do not have mychart- we will call you about results within 5 business days of [place] receiving them.  ?*please also note that you will see labs on mychart as soon as they post. I will later go in and write notes on them- will say "notes from Dr. Yong Channel"  ? ?In 5-6 weeks ?Please go to [facility]  central X-ray (updated 09/17/2019) ?- located [address] across the street from [facility] ?- in the basement ?- Hours: 8:30-5:00 PM M-F (with lunch from 12:30- 1 PM). You do NOT need an appointment.  ?- Please ensure you are covid symptom free before going in(No fever, chills, cough, congestion, runny nose, shortness of breath, fatigue, body aches, sore throat, headache, nausea, vomiting, diarrhea, or new loss of taste or smell. No known contacts with covid 19 or someone being tested for covid 19) ? ? ?Recommended follow up: Return for next already scheduled visit or sooner if needed. ? ?

## 2021-10-09 NOTE — Progress Notes (Signed)
?Phone [phone] ?  ?Subjective:  ?Kim Singh is a 81 y.o. year old very pleasant female patient who presents for transitional care management and hospital follow up for pneumonia. Patient was hospitalized from October 02, 2021 to October 04, 2021. A TCM phone call was completed on October 05, 2021. Medical complexity moderate ? ?Patient presented to the hospital with complaints of shortness of breath and cough for about 4 days.  Emergency department patient was noted to be afebrile but with blood pressure up to 190/106 and oxygen saturations as low as 87%-this improved with application of 2 L oxygen per nasal cannula.  She was diagnosed with sepsis with tachypnea and leukocytosis secondary community-acquired pneumonia with associated acute hypoxic respiratory failure.  She had a chest x-ray on 10/02/2021 which showed new left lower lobe pneumonia.  I independently reviewed this film and interpreted as the following: Airway appears normal, no bony abnormalities, cardiac silhouette largely normal, diaphragms normal, hazy opacities at left lung base/left lower lobe with no associated pleural effusion.  No pneumothorax was noted.  Due to hypoxia also had CT angiogram which showed no pulmonary embolism but confirmed left lower lobe pneumonia-also noted in right middle lobe and left upper lobe. ? ?Incidental finding of a sending aortic aneurysm at 4 cm with recommendation for annual follow-up by CTA or MRA.  Also noted to have aortic atherosclerosis and emphysema- discussed results today  ? ?She was treated in the hospital with Rocephin and azithromycin.  Received 3 doses of Rocephin before discharge.  She was able to be weaned off of oxygen before discharge.  She was discharged with 4 more days of cefdinir. ? ?There was concern for UTI but cultures remain negative. ? ?Her diabetes was considered diet controlled while hospitalized. Had been rxed metformin in past but had diarrhea then never started metformin R and follow  up a1c was unde r7 so we did not restart ? ?In regards to hypertensive urgency-blood pressure trended back down once respiratory status improved-discharged on home nadolol ? ?She did have short-term hypomagnesemia/hypokalemia-was resolved with repletion ? ?For hypothyroidism she remained on her home Synthroid.  For hyperlipidemia remain on her home Crestor- had stopped zetia in past with how low her LDL has been.  Temporary hyperbilirubinemia resolved before discharge.  Patient reported drinking 1-2 alcoholic beverages per night-was recommended she limit to 1/day-no withdrawal symptoms while hospitalized.   ? ?Today, reports some lingering nasal congestion.  ? ? See problem oriented charting as well ? ?Past Medical History-  ?Patient Active Problem List  ? Diagnosis Date Noted  ? Ascending aortic aneurysm 10/09/2021  ?  Priority: High  ? Type II diabetes mellitus with neurological manifestations ([place]) 11/29/2013  ?  Priority: High  ? Aortic atherosclerosis ([place]) 10/09/2021  ?  Priority: Medium   ? Emphysema lung ([place]) 10/09/2021  ?  Priority: Medium   ? Diabetic neuropathy ([place]) 08/22/2015  ?  Priority: Medium   ? Depression 02/28/2015  ?  Priority: Medium   ? Dyslipidemia 11/29/2013  ?  Priority: Medium   ? Hypothyroidism 11/29/2013  ?  Priority: Medium   ? Essential hypertension 11/29/2013  ?  Priority: Medium   ? Insomnia 12/19/2018  ?  Priority: Low  ? Former smoker 02/28/2015  ?  Priority: Low  ? History of breast cancer 11/30/2013  ?  Priority: Low  ? History of thyroid cancer 11/30/2013  ?  Priority: Low  ? Orthostatic hypotension 11/29/2013  ?  Priority: Low  ? Grief reaction  05/16/2017  ? ? ?Medications- reviewed and updated  ?A medical reconciliation was performed comparing current medicines to hospital discharge medications. ?Current Outpatient Medications  ?Medication Sig Dispense Refill  ? CALCIUM-VITAMIN D PO Take 1 tablet by mouth 2 (two) times daily.     ? cetirizine (ZYRTEC) 5 MG tablet Take 1  tablet (5 mg total) by mouth daily. 90 tablet 3  ? dextromethorphan-guaiFENesin (MUCINEX DM) 30-600 MG 12hr tablet Take 1 tablet by mouth 2 (two) times daily as needed for cough.    ? escitalopram (LEXAPRO) 10 MG tablet Take 1 tablet (10 mg total) by mouth daily. 90 tablet 3  ? fluticasone (FLONASE) 50 MCG/ACT nasal spray Place 2 sprays into both nostrils daily. (Patient taking differently: Place 2 sprays into both nostrils daily as needed for allergies.) 16 g 11  ? ibuprofen (ADVIL) 200 MG tablet Take 400 mg by mouth every 6 (six) hours as needed for mild pain or headache.    ? levothyroxine (SYNTHROID) 88 MCG tablet Take 1 tablet (88 mcg total) by mouth daily. 90 tablet 3  ? Multiple Vitamin (MULTIVITAMIN) tablet Take 1 tablet by mouth daily.    ? Multiple Vitamins-Minerals (ICAPS LUTEIN & ZEAXANTH, MVI, PO) Take 1 tablet by mouth daily.    ? nadolol (CORGARD) 20 MG tablet TAKE 1 TABLET DAILY (Patient taking differently: Take 20 mg by mouth daily.) 90 tablet 3  ? nystatin ointment (MYCOSTATIN) 1 application. daily as needed (rash).    ? Omega-3-acid Ethyl Esters (OMACOR PO) Take 1 tablet by mouth 2 (two) times daily.     ? rosuvastatin (CRESTOR) 10 MG tablet TAKE 1 TABLET DAILY (Patient taking differently: Take 10 mg by mouth daily.) 90 tablet 3  ? traZODone (DESYREL) 50 MG tablet TAKE ONE-HALF (1/2) TABLET AT BEDTIME AS NEEDED FOR SLEEP (Patient taking differently: Take 25 mg by mouth at bedtime as needed.) 45 tablet 3  ? vitamin B-12 (CYANOCOBALAMIN) 1000 MCG tablet Take 1,000 mcg by mouth once a week. Takes on Mondays    ? ?No current facility-administered medications for this visit.  ? ?Objective  ?Objective:  ?BP 128/68   Pulse (!) 59   Temp 97.6 ?F (36.4 ?C) (Temporal)   Wt 167 lb 12.8 oz (76.1 kg)   SpO2 96%   BMI 25.51 kg/m?  ?Gen: NAD, resting comfortably ?CV: RRR no murmurs rubs or gallops ?Lungs: trace crackles bilateral lower lung fields- otherwise CTAB- no wheezes rales or rhonchi ?Ext: minimal  edema ?Skin: warm, dry ?  ?Assessment and Plan:  ? ?1. Multifocal Community acquired pneumonia of left lower lobe of lung leading to History of sepsis ?- finished antibiotics today - no shortness of breath- feeling much better ?-recheck CXR in 5-6 weeks (mainly on LLL but multifocal on CT- no obvious underlying cancer) ?-no PT or OT at home ?- mild twinges of low level pain around left jaw a few times today but has resolved- no chest pain or sob- she will let us know if worsens ?- some nasal drainage- not using flonase but taking zyrtec- encourged her to take this.  ? ?2. Aneurysm of ascending aorta without rupture ?Noted on CT- discussed diagnosis- recommended annual follow up ?-see HTN section do not think we can push for BP <120 ? ?3. Aortic atherosclerosis ([place]) ?Already on BP medicine and lipid control- see those sections- continue risk factor modification ? ?4. Pulmonary emphysema, unspecified emphysema type ([place]) ?- quit smoking over 20 years ago . No ongoing issues but may  have made pneumonia more likely- will monitor ? ?5. Type II diabetes mellitus with neurological manifestations ([place]) ?Diabetes diet controlled. Her a1c was under 7 last visit without metformin ER (had stopped IR due to diarrhea)- we opted to remain off medicine unless a1c over 7 in the future- too soon for repeat today ? ?6. Dyslipidemia ?We discussed today that last LDL was 23 on Crestor 10 mg and Zetia 10 mg.  As a result we opted to stop her Zetia 10 mg and recheck today- stay on same meds unless LDL goes over 70 ? ?7. Essential hypertension ?Blood pressure looks good on nadolol 20 mg alone.  Had stopped quinapril in the past due to low blood pressures/orthostatic hypotension-encouraged her to check at least once or twice a week at home with goal less than 135/85 ? ?Recommended follow up: Return for next already scheduled visit or sooner if needed. ?Future Appointments  ?Date Time Provider [place]  ?01/05/2022  1:40 PM Tykeisha Peer,  Brayton Mars, MD [facility]  ? ?Lab/Order associations: ?  ICD-10-CM   ?1. Community acquired pneumonia of left lower lobe of lung  J18.9 DG Chest 2 View  ?  ?2. History of sepsis  Z86.19   ?  ?3. Aneurysm of as

## 2021-10-09 NOTE — Assessment & Plan Note (Addendum)
Blood pressure looks good on nadolol 20 mg alone.  Had stopped quinapril in the past due to low blood pressures/orthostatic hypotension-encouraged her to check at least once or twice a week at home with goal less than 135/85 ?-could push for lower with aneurysm but I am hesitant due to fall risk ?

## 2021-10-09 NOTE — Assessment & Plan Note (Signed)
Diabetes diet controlled. Her a1c was under 7 last visit without metformin ER (had stopped IR due to diarrhea)- we opted to remain off medicine unless a1c over 7 in the future- too soon for repeat today ?

## 2021-10-10 ENCOUNTER — Other Ambulatory Visit: Payer: Self-pay

## 2021-10-10 DIAGNOSIS — E875 Hyperkalemia: Secondary | ICD-10-CM

## 2021-10-13 ENCOUNTER — Other Ambulatory Visit (INDEPENDENT_AMBULATORY_CARE_PROVIDER_SITE_OTHER): Payer: Medicare Other

## 2021-10-13 DIAGNOSIS — E875 Hyperkalemia: Secondary | ICD-10-CM

## 2021-10-13 LAB — BASIC METABOLIC PANEL
BUN: 12 mg/dL (ref 6–23)
CO2: 32 mEq/L (ref 19–32)
Calcium: 9.7 mg/dL (ref 8.4–10.5)
Chloride: 102 mEq/L (ref 96–112)
Creatinine, Ser: 0.93 mg/dL (ref 0.40–1.20)
GFR: 57.07 mL/min — ABNORMAL LOW (ref >60.00)
Glucose, Bld: 102 mg/dL — ABNORMAL HIGH (ref 70–99)
Potassium: 4 mEq/L (ref 3.5–5.1)
Sodium: 141 mEq/L (ref 135–145)

## 2021-10-18 ENCOUNTER — Encounter: Payer: Self-pay | Admitting: Family Medicine

## 2021-10-18 DIAGNOSIS — D485 Neoplasm of uncertain behavior of skin: Secondary | ICD-10-CM | POA: Diagnosis not present

## 2021-10-18 DIAGNOSIS — L821 Other seborrheic keratosis: Secondary | ICD-10-CM | POA: Diagnosis not present

## 2021-10-18 DIAGNOSIS — L814 Other melanin hyperpigmentation: Secondary | ICD-10-CM | POA: Diagnosis not present

## 2021-10-18 DIAGNOSIS — D2271 Melanocytic nevi of right lower limb, including hip: Secondary | ICD-10-CM | POA: Diagnosis not present

## 2021-10-18 DIAGNOSIS — L820 Inflamed seborrheic keratosis: Secondary | ICD-10-CM | POA: Diagnosis not present

## 2021-11-28 DIAGNOSIS — D485 Neoplasm of uncertain behavior of skin: Secondary | ICD-10-CM | POA: Diagnosis not present

## 2021-11-28 DIAGNOSIS — L988 Other specified disorders of the skin and subcutaneous tissue: Secondary | ICD-10-CM | POA: Diagnosis not present

## 2022-01-05 ENCOUNTER — Encounter: Payer: Self-pay | Admitting: Family Medicine

## 2022-01-05 ENCOUNTER — Ambulatory Visit (INDEPENDENT_AMBULATORY_CARE_PROVIDER_SITE_OTHER): Payer: Medicare Other | Admitting: Family Medicine

## 2022-01-05 VITALS — BP 128/83 | HR 53 | Temp 98.5°F | Ht 68.0 in | Wt 171.2 lb

## 2022-01-05 DIAGNOSIS — Z79899 Other long term (current) drug therapy: Secondary | ICD-10-CM

## 2022-01-05 DIAGNOSIS — E785 Hyperlipidemia, unspecified: Secondary | ICD-10-CM

## 2022-01-05 DIAGNOSIS — E039 Hypothyroidism, unspecified: Secondary | ICD-10-CM

## 2022-01-05 DIAGNOSIS — E1149 Type 2 diabetes mellitus with other diabetic neurological complication: Secondary | ICD-10-CM | POA: Diagnosis not present

## 2022-01-05 DIAGNOSIS — I10 Essential (primary) hypertension: Secondary | ICD-10-CM | POA: Diagnosis not present

## 2022-01-05 LAB — TSH: TSH: 3.91 u[IU]/mL (ref 0.35–5.50)

## 2022-01-05 LAB — CBC WITH DIFFERENTIAL/PLATELET
Basophils Absolute: 0 10*3/uL (ref 0.0–0.1)
Basophils Relative: 0.9 % (ref 0.0–3.0)
Eosinophils Absolute: 0.2 10*3/uL (ref 0.0–0.7)
Eosinophils Relative: 3 % (ref 0.0–5.0)
HCT: 42.4 % (ref 36.0–46.0)
Hemoglobin: 14.4 g/dL (ref 12.0–15.0)
Lymphocytes Relative: 25.2 % (ref 12.0–46.0)
Lymphs Abs: 1.4 10*3/uL (ref 0.7–4.0)
MCHC: 34 g/dL (ref 30.0–36.0)
MCV: 96.4 fl (ref 78.0–100.0)
Monocytes Absolute: 0.7 10*3/uL (ref 0.1–1.0)
Monocytes Relative: 13.1 % — ABNORMAL HIGH (ref 3.0–12.0)
Neutro Abs: 3.2 10*3/uL (ref 1.4–7.7)
Neutrophils Relative %: 57.8 % (ref 43.0–77.0)
Platelets: 131 10*3/uL — ABNORMAL LOW (ref 150.0–400.0)
RBC: 4.41 Mil/uL (ref 3.87–5.11)
RDW: 13.7 % (ref 11.5–15.5)
WBC: 5.5 10*3/uL (ref 4.0–10.5)

## 2022-01-05 LAB — VITAMIN B12: Vitamin B-12: 502 pg/mL (ref 211–911)

## 2022-01-05 LAB — HEMOGLOBIN A1C: Hgb A1c MFr Bld: 6.5 % (ref 4.6–6.5)

## 2022-01-05 LAB — COMPREHENSIVE METABOLIC PANEL
ALT: 21 U/L (ref 0–35)
AST: 26 U/L (ref 0–37)
Albumin: 4.1 g/dL (ref 3.5–5.2)
Alkaline Phosphatase: 56 U/L (ref 39–117)
BUN: 15 mg/dL (ref 6–23)
CO2: 34 mEq/L — ABNORMAL HIGH (ref 19–32)
Calcium: 10.1 mg/dL (ref 8.4–10.5)
Chloride: 101 mEq/L (ref 96–112)
Creatinine, Ser: 0.84 mg/dL (ref 0.40–1.20)
GFR: 64.38 mL/min (ref >60.00)
Glucose, Bld: 112 mg/dL — ABNORMAL HIGH (ref 70–99)
Potassium: 4.5 mEq/L (ref 3.5–5.1)
Sodium: 143 mEq/L (ref 135–145)
Total Bilirubin: 1.3 mg/dL — ABNORMAL HIGH (ref 0.2–1.2)
Total Protein: 6.8 g/dL (ref 6.0–8.3)

## 2022-01-05 LAB — LDL CHOLESTEROL, DIRECT: Direct LDL: 97 mg/dL

## 2022-01-05 NOTE — Patient Instructions (Addendum)
Health Maintenance Due  Topic Date Due   Zoster Vaccines- Shingrix (1 of 2)  Please check with your pharmacy to see if they have the shingrix vaccine. If they do- please get this immunization and update [place] by phone call or mychart with dates you receive the vaccine  Never done   COVID-19 Vaccine (4 - Booster for [employer] series)  Consider in the fall 07/06/2020   OPHTHALMOLOGY EXAM   Please schedule this and have them send [place] a copy 05/02/2021    Please go to [facility] (updated 09/17/2019) - located [address] across the street from [place] - in the basement - Hours: 8:30-5:00 PM M-F (with lunch from 12:30- 1 PM). You do NOT need an appointment.  - Please ensure you are covid symptom free before going in(No fever, chills, cough, congestion, runny nose, shortness of breath, fatigue, body aches, sore throat, headache, nausea, vomiting, diarrhea, or new loss of taste or smell. No known contacts with covid 19 or someone being tested for covid 19)  Please stop by lab before you go If you have mychart- we will send your results within 3 business days of [place] receiving them.  If you do not have mychart- we will call you about results within 5 business days of [place] receiving them.  *please also note that you will see labs on mychart as soon as they post. I will later go in and write notes on them- will say "notes from Dr. Yong Channel"    Recommended follow up: Return in about 4 months (around 05/07/2022) for followup or sooner if needed.Schedule b4 you leave.

## 2022-01-05 NOTE — Progress Notes (Signed)
Phone [phone] In person visit   Subjective:   Kim Singh is a 82 y.o. year old very pleasant female patient who presents for/with See problem oriented charting Chief Complaint  Patient presents with   Follow-up    Diabetes    Past Medical History-  Patient Active Problem List   Diagnosis Date Noted   Ascending aortic aneurysm ([place]) 10/09/2021    Priority: High   Type II diabetes mellitus with neurological manifestations ([place]) 11/29/2013    Priority: High   Aortic atherosclerosis ([place]) 10/09/2021    Priority: Medium    Emphysema lung ([place]) 10/09/2021    Priority: Medium    Diabetic neuropathy ([place]) 08/22/2015    Priority: Medium    Depression 02/28/2015    Priority: Medium    Dyslipidemia 11/29/2013    Priority: Medium    Hypothyroidism 11/29/2013    Priority: Medium    Essential hypertension 11/29/2013    Priority: Medium    Insomnia 12/19/2018    Priority: Low   Former smoker 02/28/2015    Priority: Low   History of breast cancer 11/30/2013    Priority: Low   History of thyroid cancer 11/30/2013    Priority: Low   Orthostatic hypotension 11/29/2013    Priority: Low   Grief reaction 05/16/2017    Medications- reviewed and updated Current Outpatient Medications  Medication Sig Dispense Refill   CALCIUM-VITAMIN D PO Take 1 tablet by mouth 2 (two) times daily.      cetirizine (ZYRTEC) 5 MG tablet Take 1 tablet (5 mg total) by mouth daily. 90 tablet 3   escitalopram (LEXAPRO) 10 MG tablet Take 1 tablet (10 mg total) by mouth daily. 90 tablet 3   levothyroxine (SYNTHROID) 88 MCG tablet Take 1 tablet (88 mcg total) by mouth daily. 90 tablet 3   Multiple Vitamin (MULTIVITAMIN) tablet Take 1 tablet by mouth daily.     Multiple Vitamins-Minerals (ICAPS LUTEIN & ZEAXANTH, MVI, PO) Take 1 tablet by mouth daily.     nadolol (CORGARD) 20 MG tablet TAKE 1 TABLET DAILY (Patient taking differently: Take 20 mg by mouth daily.) 90 tablet 3   Omega-3-acid Ethyl Esters  (OMACOR PO) Take 1 tablet by mouth 2 (two) times daily.      rosuvastatin (CRESTOR) 10 MG tablet TAKE 1 TABLET DAILY (Patient taking differently: Take 10 mg by mouth daily.) 90 tablet 3   traZODone (DESYREL) 50 MG tablet TAKE ONE-HALF (1/2) TABLET AT BEDTIME AS NEEDED FOR SLEEP (Patient taking differently: Take 25 mg by mouth at bedtime as needed.) 45 tablet 3   vitamin B-12 (CYANOCOBALAMIN) 1000 MCG tablet Take 1,000 mcg by mouth once a week. Takes on Mondays     No current facility-administered medications for this visit.     Objective:  BP 128/83 (BP Location: Left Arm, Patient Position: Sitting, Cuff Size: Large)   Pulse (!) 53   Temp 98.5 F (36.9 C) (Temporal)   Ht '5\' 8"'$  (1.727 m)   Wt 171 lb 4 oz (77.7 kg)   SpO2 94%   BMI 26.04 kg/m  Gen: NAD, resting comfortably CV: RRR no murmurs rubs or gallops Lungs: CTAB no crackles, wheeze, rhonchi Abdomen: soft/nontender/nondistended/normal bowel sounds.  Ext: trace edema Skin: warm, dry    Assessment and Plan   #Pneumonia-finished antibiotics at last visit and was to repeat chest x-ray in 5 to 6 weeks-I encouraged her to complete x-ray today- she agrees  #Diabetes with neuropathy.  S: Most recently controlled without medication-in the past  on Metformin 1000 mg daily . Not on medicine for neuropathy- doesn't want to take anything CBGs- does not check sugar but has meter Lab Results  Component Value Date   HGBA1C 6.6 (H) 09/05/2021   HGBA1C 6.4 03/06/2021   HGBA1C 6.8 (H) 01/28/2020   A/P: Good control without medication last visit-update A1c with labs today   #Hypertension S: controlled on nadolol 20 mg -In the past on quinapril 5 mg (found to have a fungal lip issue and was not angioedema) BP Readings from Last 3 Encounters:  01/05/22 128/83  10/09/21 128/68  10/04/21 126/71  A/P: Controlled. Continue current medications.  - doing well not on quinapril    Depression  s: Compliant with Lexapro 10 mg.  Loss of son to  suicide in October 2018 contributes. -trazodone half tablet for sleep    01/05/2022    1:58 PM 09/05/2021   12:51 PM 04/19/2021    2:13 PM  Depression screen PHQ 2/9  Decreased Interest 0 0 0  Down, Depressed, Hopeless 0 0 0  PHQ - 2 Score 0 0 0  Altered sleeping 0 0 0  Tired, decreased energy 0 0 0  Change in appetite 0 0 0  Feeling bad or failure about yourself  0 0 0  Trouble concentrating 0 0 0  Moving slowly or fidgety/restless 0 0 0  Suicidal thoughts 0 0 0  PHQ-9 Score 0 0 0  Difficult doing work/chores Not difficult at all Not difficult at all Not difficult at all  A/P:  full remission- continue current medicines   #Hyperlipidemia #Aortic atherosclerosis S: Compliant Crestor 10 mg daily. -ldl was 23 when also on zetia Lab Results  Component Value Date   CHOL 91 03/06/2021   HDL 37.70 (L) 03/06/2021   LDLCALC 23 03/06/2021   LDLDIRECT 68.0 10/09/2021   TRIG 148.0 03/06/2021   CHOLHDL 2 03/06/2021   A/P: Controlled on most recent check-continue current medication with LDL goal 70 or less.  Aortic atherosclerosis likely stable with excellent lipid control and hypertension control  #Hypothyroidism S: Compliant with levothyroxine 88 mcg Lab Results  Component Value Date   TSH 1.48 06/06/2021  A/P:  hopefully stable- update TSH today. Continue current meds for now   Recommended follow up: Return in about 4 months (around 05/07/2022) for followup or sooner if needed.Schedule b4 you leave.  Lab/Order associations:   ICD-10-CM   1. Type II diabetes mellitus with neurological manifestations (HCC)  E11.49 CBC with Differential/Platelet    Comprehensive metabolic panel    Hemoglobin A1c    2. Hypothyroidism, unspecified type  E03.9 TSH    3. Essential hypertension  I10 CBC with Differential/Platelet    Comprehensive metabolic panel    4. High risk medication use  Z79.899 Vitamin B12    5. Dyslipidemia  E78.5 LDL cholesterol, direct      No orders of the defined  types were placed in this encounter.   Return precautions advised.  Garret Reddish, MD

## 2022-02-07 ENCOUNTER — Other Ambulatory Visit: Payer: Self-pay

## 2022-02-07 MED ORDER — LEVOTHYROXINE SODIUM 88 MCG PO TABS: 88.0000 ug | ORAL_TABLET | Freq: Every day | ORAL | 3 refills | Status: DC

## 2022-03-12 ENCOUNTER — Other Ambulatory Visit: Payer: Self-pay | Admitting: Family Medicine

## 2022-04-16 ENCOUNTER — Encounter: Payer: Self-pay | Admitting: *Deleted

## 2022-04-20 ENCOUNTER — Other Ambulatory Visit: Payer: Self-pay | Admitting: Family Medicine

## 2022-04-27 ENCOUNTER — Ambulatory Visit (INDEPENDENT_AMBULATORY_CARE_PROVIDER_SITE_OTHER): Payer: Medicare Other

## 2022-04-27 VITALS — BP 118/74 | HR 64 | Temp 98.0°F | Wt 163.6 lb

## 2022-04-27 DIAGNOSIS — Z0000 Encounter for general adult medical examination without abnormal findings: Secondary | ICD-10-CM

## 2022-04-27 NOTE — Patient Instructions (Signed)
Kim Singh , Thank you for taking time to come for your Medicare Wellness Visit. I appreciate your ongoing commitment to your health goals. Please review the following plan we discussed and let me know if I can assist you in the future.   These are the goals we discussed:  Goals      Exercise 150 minutes per week (moderate activity)     Will continue with PT  Will develop a personal plan of exercise that meets your needs;  Older adults aged 83 or older, who are generally fit and have no health conditions that limit their mobility, should try to be active daily and should do: at least 150 minutes of moderate aerobic activity such as cycling or walking every week, and  strength exercises on two or more days a week that work all the major muscles (legs, hips, back, abdomen, chest, shoulders and arms).      OR  75 minutes of vigorous aerobic activity such as running or a game of singles tennis every week, and  strength exercises on two or more days a week that work all the major muscles (legs, hips, back, abdomen, chest, shoulders and arms).     OR  a mix of moderate and vigorous aerobic activity every week. For example, two 30-minute runs, plus 30 minutes of fast walking, equates to 150 minutes of moderate aerobic activity, and  strength exercises on two or more days a week that work all the major muscles (legs, hips, back, abdomen, chest, shoulders and arms).  A rule of thumb is that one minute of vigorous activity provides the same health benefits as two minutes of moderate activity. You should also try to break up long periods of sitting with light activity, as sedentary behaviour is now considered an independent risk factor for ill health, no matter how much exercise you do. Find out why sitting is bad for your health. Older adults at risk of falls, such as people with weak legs, poor balance and some medical conditions, should do exercises to improve balance and co-ordination on at least two  days a week. Examples include yoga, tai chi and dancing.       maintain current health status        This is a list of the screening recommended for you and due dates:  Health Maintenance  Topic Date Due   Zoster (Shingles) Vaccine (1 of 2) Never done   COVID-19 Vaccine (4 - Pfizer risk series) 07/06/2020   Eye exam for diabetics  05/02/2021   Flu Shot  02/20/2022   Complete foot exam   03/06/2022   Hemoglobin A1C  07/07/2022   Yearly kidney health urinalysis for diabetes  09/05/2022   Yearly kidney function blood test for diabetes  01/06/2023   Tetanus Vaccine  04/17/2026   Pneumonia Vaccine  Completed   DEXA scan (bone density measurement)  Completed   HPV Vaccine  Aged Out    Advanced directives: Advance directive discussed with you today. Even though you declined this today please call our office should you change your mind and we can give you the proper paperwork for you to fill out.  Conditions/risks identified: keep a healthy weight and eat right   Next appointment: Follow up in one year for your annual wellness visit.   Preventive Care 64 Years and Older, Female  Preventive care refers to lifestyle choices and visits with your health care provider that can promote health and wellness. What does preventive care  include? A yearly physical exam. This is also called an annual well check. Dental exams once or twice a year. Routine eye exams. Ask your health care provider how often you should have your eyes checked. Personal lifestyle choices, including: Daily care of your teeth and gums. Regular physical activity. Eating a healthy diet. Avoiding tobacco and drug use. Limiting alcohol use. Practicing safe sex. Taking low doses of aspirin every day. Taking vitamin and mineral supplements as recommended by your health care provider. What happens during an annual well check? The services and screenings done by your health care provider during your annual well check will  depend on your age, overall health, lifestyle risk factors, and family history of disease. Counseling  Your health care provider may ask you questions about your: Alcohol use. Tobacco use. Drug use. Emotional well-being. Home and relationship well-being. Sexual activity. Eating habits. History of falls. Memory and ability to understand (cognition). Work and work Statistician. Screening  You may have the following tests or measurements: Height, weight, and BMI. Blood pressure. Lipid and cholesterol levels. These may be checked every 5 years, or more frequently if you are over 26 years old. Skin check. Lung cancer screening. You may have this screening every year starting at age 37 if you have a 30-pack-year history of smoking and currently smoke or have quit within the past 15 years. Fecal occult blood test (FOBT) of the stool. You may have this test every year starting at age 6. Flexible sigmoidoscopy or colonoscopy. You may have a sigmoidoscopy every 5 years or a colonoscopy every 10 years starting at age 70. Prostate cancer screening. Recommendations will vary depending on your family history and other risks. Hepatitis C blood test. Hepatitis B blood test. Sexually transmitted disease (STD) testing. Diabetes screening. This is done by checking your blood sugar (glucose) after you have not eaten for a while (fasting). You may have this done every 1-3 years. Abdominal aortic aneurysm (AAA) screening. You may need this if you are a current or former smoker. Osteoporosis. You may be screened starting at age 64 if you are at high risk. Talk with your health care provider about your test results, treatment options, and if necessary, the need for more tests. Vaccines  Your health care provider may recommend certain vaccines, such as: Influenza vaccine. This is recommended every year. Tetanus, diphtheria, and acellular pertussis (Tdap, Td) vaccine. You may need a Td booster every 10  years. Zoster vaccine. You may need this after age 82. Pneumococcal 13-valent conjugate (PCV13) vaccine. One dose is recommended after age 45. Pneumococcal polysaccharide (PPSV23) vaccine. One dose is recommended after age 15. Talk to your health care provider about which screenings and vaccines you need and how often you need them. This information is not intended to replace advice given to you by your health care provider. Make sure you discuss any questions you have with your health care provider. Document Released: 08/05/2015 Document Revised: 03/28/2016 Document Reviewed: 05/10/2015 Elsevier Interactive Patient Education  2017 [place] Prevention in the Home Falls can cause injuries. They can happen to people of all ages. There are many things you can do to make your home safe and to help prevent falls. What can I do on the outside of my home? Regularly fix the edges of walkways and driveways and fix any cracks. Remove anything that might make you trip as you walk through a door, such as a raised step or threshold. Trim any bushes or trees on  the path to your home. Use bright outdoor lighting. Clear any walking paths of anything that might make someone trip, such as rocks or tools. Regularly check to see if handrails are loose or broken. Make sure that both sides of any steps have handrails. Any raised decks and porches should have guardrails on the edges. Have any leaves, snow, or ice cleared regularly. Use sand or salt on walking paths during winter. Clean up any spills in your garage right away. This includes oil or grease spills. What can I do in the bathroom? Use night lights. Install grab bars by the toilet and in the tub and shower. Do not use towel bars as grab bars. Use non-skid mats or decals in the tub or shower. If you need to sit down in the shower, use a plastic, non-slip stool. Keep the floor dry. Clean up any water that spills on the floor as soon as it  happens. Remove soap buildup in the tub or shower regularly. Attach bath mats securely with double-sided non-slip rug tape. Do not have throw rugs and other things on the floor that can make you trip. What can I do in the bedroom? Use night lights. Make sure that you have a light by your bed that is easy to reach. Do not use any sheets or blankets that are too big for your bed. They should not hang down onto the floor. Have a firm chair that has side arms. You can use this for support while you get dressed. Do not have throw rugs and other things on the floor that can make you trip. What can I do in the kitchen? Clean up any spills right away. Avoid walking on wet floors. Keep items that you use a lot in easy-to-reach places. If you need to reach something above you, use a strong step stool that has a grab bar. Keep electrical cords out of the way. Do not use floor polish or wax that makes floors slippery. If you must use wax, use non-skid floor wax. Do not have throw rugs and other things on the floor that can make you trip. What can I do with my stairs? Do not leave any items on the stairs. Make sure that there are handrails on both sides of the stairs and use them. Fix handrails that are broken or loose. Make sure that handrails are as long as the stairways. Check any carpeting to make sure that it is firmly attached to the stairs. Fix any carpet that is loose or worn. Avoid having throw rugs at the top or bottom of the stairs. If you do have throw rugs, attach them to the floor with carpet tape. Make sure that you have a light switch at the top of the stairs and the bottom of the stairs. If you do not have them, ask someone to add them for you. What else can I do to help prevent falls? Wear shoes that: Do not have high heels. Have rubber bottoms. Are comfortable and fit you well. Are closed at the toe. Do not wear sandals. If you use a stepladder: Make sure that it is fully opened.  Do not climb a closed stepladder. Make sure that both sides of the stepladder are locked into place. Ask someone to hold it for you, if possible. Clearly mark and make sure that you can see: Any grab bars or handrails. First and last steps. Where the edge of each step is. Use tools that help you move around (  mobility aids) if they are needed. These include: Canes. Walkers. Scooters. Crutches. Turn on the lights when you go into a dark area. Replace any light bulbs as soon as they burn out. Set up your furniture so you have a clear path. Avoid moving your furniture around. If any of your floors are uneven, fix them. If there are any pets around you, be aware of where they are. Review your medicines with your doctor. Some medicines can make you feel dizzy. This can increase your chance of falling. Ask your doctor what other things that you can do to help prevent falls. This information is not intended to replace advice given to you by your health care provider. Make sure you discuss any questions you have with your health care provider. Document Released: 05/05/2009 Document Revised: 12/15/2015 Document Reviewed: 08/13/2014 Elsevier Interactive Patient Education  2017 [employer].

## 2022-04-27 NOTE — Progress Notes (Signed)
Subjective:   Kim Singh is a 81 y.o. female who presents for Medicare Annual (Subsequent) preventive examination.  Review of Systems     Cardiac Risk Factors include: advanced age (>18mn, >>24women);diabetes mellitus;hypertension;dyslipidemia     Objective:    Today's Vitals   04/27/22 1334  BP: 118/74  Pulse: 64  Temp: 98 F (36.7 C)  SpO2: 98%  Weight: 163 lb 9.6 oz (74.2 kg)   Body mass index is 24.88 kg/m.     04/27/2022    1:45 PM 10/02/2021    8:00 PM 04/19/2021    2:16 PM 09/28/2019    2:06 PM 09/03/2018    1:07 PM 04/23/2017   10:53 AM 07/22/2016    1:08 AM  Advanced Directives  Does Patient Have a Medical Advance Directive? Yes Yes Yes Yes Yes Yes Yes  Type of AParamedicof [place]arls[place]heLiving will H[place]mit ViewLiving will H[place]sterLiving will Living will;Healthcare Power of [place]Living will [place] Does patient want to make changes to medical advance directive?   No - Patient declined No - Patient declined No - Patient declined Yes (MAU/Ambulatory/Procedural Areas - Information given)   Copy of [place] Chart? No - copy requested No - copy requested Yes - validated most recent copy scanned in chart (See row information) No - copy requested No - copy requested No - copy requested No - copy requested  Would patient like information on creating a medical advance directive?       No - Patient declined    Current Medications (verified) Outpatient Encounter Medications as of 04/27/2022  Medication Sig   CALCIUM-VITAMIN D PO Take 1 tablet by mouth 2 (two) times daily.    cetirizine (ZYRTEC) 5 MG tablet TAKE 1 TABLET DAILY   escitalopram (LEXAPRO) 10 MG tablet Take 1 tablet (10 mg total) by mouth daily.   levothyroxine (SYNTHROID) 88 MCG tablet TAKE 1 TABLET DAILY   Multiple Vitamin  (MULTIVITAMIN) tablet Take 1 tablet by mouth daily.   Multiple Vitamins-Minerals (ICAPS LUTEIN & ZEAXANTH, MVI, PO) Take 1 tablet by mouth daily.   nadolol (CORGARD) 20 MG tablet TAKE 1 TABLET DAILY (Patient taking differently: Take 20 mg by mouth daily.)   Omega-3-acid Ethyl Esters (OMACOR PO) Take 1 tablet by mouth 2 (two) times daily.    rosuvastatin (CRESTOR) 10 MG tablet TAKE 1 TABLET DAILY (Patient taking differently: Take 10 mg by mouth daily.)   traZODone (DESYREL) 50 MG tablet TAKE ONE-HALF (1/2) TABLET AT BEDTIME AS NEEDED FOR SLEEP (Patient taking differently: Take 25 mg by mouth at bedtime as needed.)   vitamin B-12 (CYANOCOBALAMIN) 1000 MCG tablet Take 1,000 mcg by mouth once a week. Takes on Mondays   No facility-administered encounter medications on file as of 04/27/2022.    Allergies (verified) Codeine   History: Past Medical History:  Diagnosis Date   Breast cancer ([place] 2001   Left   Diabetes mellitus type 2, uncomplicated ([facility]    Personal history of chemotherapy    Personal history of radiation therapy    Pure hypercholesterolemia    Unspecified disorder of thyroid    Unspecified essential hypertension    Past Surgical History:  Procedure Laterality Date   BREAST EXCISIONAL BIOPSY Left    BREAST LUMPECTOMY Left 2001   THYROIDECTOMY     total   Family History  Problem Relation Age of  Onset   Diabetes Mother    Hearing loss Father    Cancer Sister        Bile Duct Cancer   Depression Son    Social History   Socioeconomic History   Marital status: Widowed    Spouse name: Not on file   Number of children: Not on file   Years of education: Not on file   Highest education level: Not on file  Occupational History   Occupation: Retired  Tobacco Use   Smoking status: Former    Packs/day: 1.00    Years: 41.00    Total pack years: 41.00    Types: Cigarettes    Quit date: 08/02/1997    Years since quitting: 24.7   Smokeless tobacco: Never  Vaping Use    Vaping Use: Never used  Substance and Sexual Activity   Alcohol use: Yes    Alcohol/week: 7.0 standard drinks of alcohol    Types: 7 Glasses of wine per week    Comment: glass of wine every day   Drug use: No   Sexual activity: Not Currently  Other Topics Concern   Not on file  Social History Narrative   Widowed 2008. 3 children. 4 grandkids. No greatgrandkids.    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       Hobbies: in [place] had a widowed group, here joined a church with kids, Psychologist, occupational work. Looking for friends network   Social Determinants of Health   Financial Resource Strain: Low Risk  (04/27/2022)   Overall Financial Resource Strain (CARDIA)    Difficulty of Paying Living Expenses: Not hard at all  Food Insecurity: No Food Insecurity (04/27/2022)   Hunger Vital Sign    Worried About Running Out of Food in the Last Year: Never true    Ran Out of Food in the Last Year: Never true  Transportation Needs: No Transportation Needs (04/27/2022)   PRAPARE - Hydrologist (Medical): No    Lack of Transportation (Non-Medical): No  Physical Activity: Inactive (04/27/2022)   Exercise Vital Sign    Days of Exercise per Week: 0 days    Minutes of Exercise per Session: 0 min  Stress: No Stress Concern Present (04/27/2022)   [place]    Feeling of Stress : Not at all  Social Connections: Moderately Isolated (04/27/2022)   Social Connection and Isolation Panel [NHANES]    Frequency of Communication with Friends and Family: More than three times a week    Frequency of Social Gatherings with Friends and Family: More than three times a week    Attends Religious Services: More than 4 times per year    Active Member of [employer] or Organizations: No    Attends English as a second language teacher Meetings: Never    Marital Status: Widowed    Tobacco Counseling Counseling given: Not Answered   Clinical Intake:  Pre-visit preparation completed: Yes  Pain : No/denies pain     BMI - recorded: 24.88 Nutritional Status: BMI 25 -29 Overweight Nutritional Risks: None Diabetes: Yes CBG done?: No Did pt. bring in CBG monitor from home?: No  How often do you need to have someone help you when you read instructions, pamphlets, or other written materials from your doctor or pharmacy?: 1 - Never  Diabetic?Nutrition Risk Assessment:  Has the patient had any N/V/D within the last 2 months?  No  Does the patient have any non-healing wounds?  No  Has the patient had any unintentional weight loss or weight gain?  No   Diabetes:  Is the patient diabetic?  Yes  If diabetic, was a CBG obtained today?  No  Did the patient bring in their glucometer from home?  No  How often do you monitor your CBG's? N/A.   Financial Strains and Diabetes Management:  Are you having any financial strains with the device, your supplies or your medication? No .  Does the patient want to be seen by Chronic Care Management for management of their diabetes?  No  Would the patient like to be referred to a Nutritionist or for Diabetic Management?  No   Diabetic Exams:  Diabetic Eye Exam: Overdue for diabetic eye exam. Pt has been advised about the importance in completing this exam. Patient advised to call and schedule an eye exam. Diabetic Foot Exam: Overdue, Pt has been advised about the importance in completing this exam. Pt is scheduled for diabetic foot exam on next appt .   Interpreter Needed?: No  Information entered by :: Charlott Rakes, LPN   Activities of Daily Living    04/27/2022    1:48 PM 10/02/2021    8:07 PM  In your present state of health, do you have any difficulty performing the following activities:  Hearing? 0   Vision? 0   Difficulty concentrating or making  decisions? 0   Walking or climbing stairs? 1   Comment take my time   Dressing or bathing? 0   Doing errands, shopping? 0 0  Preparing Food and eating ? N   Using the Toilet? N   In the past six months, have you accidently leaked urine? N   Do you have problems with loss of bowel control? N   Managing your Medications? N   Managing your Finances? N   Housekeeping or managing your Housekeeping? N     Patient Care Team: Marin Olp, MD as PCP - General (Family Medicine) Camillo Flaming, [place] as Consulting Physician (Optometry) Rozetta Nunnery, MD (Inactive) as Consulting Physician (Otolaryngology)  Indicate any recent Medical Services you may have received from other than [facility] providers in the past year (date may be approximate).     Assessment:   This is a routine wellness examination for [place].  Hearing/Vision screen Hearing Screening - Comments:: Pt wears hearing aids  Vision Screening - Comments:: Pt will follow up with new provider   Dietary issues and exercise activities discussed: Current Exercise Habits: The patient does not participate in regular exercise at present   Goals Addressed             This Visit's Progress    Patient Stated       Maintain health a healthy weight and eating right       Depression Screen    04/27/2022    1:43 PM 01/05/2022    1:58 PM 09/05/2021   12:51 PM 04/19/2021    2:13 PM 03/06/2021    1:12 PM 08/02/2020   12:58 PM 11/26/2019    8:46 AM  PHQ 2/9 Scores  PHQ - 2 Score 1 0 0 0 0 0 0  PHQ- 9 Score  0 0 0 0 0 0    Fall Risk    04/27/2022    1:46 PM 04/19/2021  2:17 PM 03/06/2021    1:12 PM 09/28/2019   12:57 PM 09/26/2018    1:08 PM  Fall Risk   Falls in the past year? 0 0 0 0 0  Number falls in past yr: 0 0 0 0   Injury with Fall? 0 0 0 0   Risk for fall due to : No Fall Risks;Impaired vision;Impaired balance/gait No Fall Risks No Fall Risks    Follow up Falls prevention discussed  Falls evaluation completed       FALL RISK PREVENTION PERTAINING TO THE HOME:  Any stairs in or around the home? Yes  If so, are there any without handrails? No  Home free of loose throw rugs in walkways, pet beds, electrical cords, etc? Yes  Adequate lighting in your home to reduce risk of falls? Yes   ASSISTIVE DEVICES UTILIZED TO PREVENT FALLS:  Life alert? No  Use of a cane, walker or w/c? No  Grab bars in the bathroom? No  Shower chair or bench in shower? Yes  Elevated toilet seat or a handicapped toilet? No   TIMED UP AND GO:  Was the test performed? Yes .  Length of time to ambulate 10 feet: 10 sec.   Gait steady and fast without use of assistive device  Cognitive Function:    09/03/2018    1:14 PM  MMSE - Mini Mental State Exam  Orientation to time 5  Orientation to Place 5  Registration 3  Attention/ Calculation 5  Recall 3  Language- name 2 objects 2  Language- repeat 1  Language- follow 3 step command 3  Language- read & follow direction 1  Write a sentence 1  Copy design 1  Total score 30        04/27/2022    1:49 PM 04/19/2021    2:19 PM 09/28/2019    2:07 PM  6CIT Screen  What Year? 0 points 0 points 0 points  What month? 0 points 0 points 0 points  What time? 0 points 0 points 0 points  Count back from 20 0 points 0 points 0 points  Months in reverse 0 points 0 points 0 points  Repeat phrase 0 points 0 points 0 points  Total Score 0 points 0 points 0 points    Immunizations Immunization History  Administered Date(s) Administered   Fluad Quad(high Dose 65+) 03/31/2019, 06/06/2021   Influenza, High Dose Seasonal PF 06/28/2014, 04/06/2016, 05/13/2017, 09/03/2018   PFIZER(Purple Top)SARS-COV-2 Vaccination 09/12/2019, 10/06/2019, 05/11/2020   Pneumococcal Conjugate-13 06/28/2014, 04/17/2016   Pneumococcal Polysaccharide-23 08/22/2015   Tdap 04/17/2016    TDAP status: Up to date  Flu shot due pt made aware she can go to Pharmacy   Pneumococcal vaccine status: Up to  date  Covid-19 vaccine status: Completed vaccines  Qualifies for Shingles Vaccine? Yes   Zostavax completed No   Shingrix Completed?: No.    Education has been provided regarding the importance of this vaccine. Patient has been advised to call insurance company to determine out of pocket expense if they have not yet received this vaccine. Advised may also receive vaccine at local pharmacy or Health Dept. Verbalized acceptance and understanding.  Screening Tests Health Maintenance  Topic Date Due   Zoster Vaccines- Shingrix (1 of 2) Never done   COVID-19 Vaccine (4 - Pfizer risk series) 07/06/2020   OPHTHALMOLOGY EXAM  05/02/2021   INFLUENZA VACCINE  02/20/2022   FOOT EXAM  03/06/2022   HEMOGLOBIN A1C  07/07/2022   Diabetic  kidney evaluation - Urine ACR  09/05/2022   Diabetic kidney evaluation - GFR measurement  01/06/2023   TETANUS/TDAP  04/17/2026   Pneumonia Vaccine 88+ Years old  Completed   DEXA SCAN  Completed   HPV VACCINES  Aged Out    Health Maintenance  Health Maintenance Due  Topic Date Due   Zoster Vaccines- Shingrix (1 of 2) Never done   COVID-19 Vaccine (4 - Pfizer risk series) 07/06/2020   OPHTHALMOLOGY EXAM  05/02/2021   INFLUENZA VACCINE  02/20/2022   FOOT EXAM  03/06/2022    Colorectal cancer screening: No longer required.   Mammogram status: No longer required due to per apt .  Bone Density status: Completed 10/16/11. Results reflect: Bone density results: NORMAL. Repeat every as directed  years.  Additional Screening:   Vision Screening: Recommended annual ophthalmology exams for early detection of glaucoma and other disorders of the eye. Is the patient up to date with their annual eye exam?  No  Who is the provider or what is the name of the office in which the patient attends annual eye exams? Will follow up with new provider  If pt is not established with a provider, would they like to be referred to a provider to establish care? No .   Dental  Screening: Recommended annual dental exams for proper oral hygiene  Community Resource Referral / Chronic Care Management: CRR required this visit?  No   CCM required this visit?  No      Plan:     I have personally reviewed and noted the following in the patient's chart:   Medical and social history Use of alcohol, tobacco or illicit drugs  Current medications and supplements including opioid prescriptions. Patient is not currently taking opioid prescriptions. Functional ability and status Nutritional status Physical activity Advanced directives List of other physicians Hospitalizations, surgeries, and ER visits in previous 12 months Vitals Screenings to include cognitive, depression, and falls Referrals and appointments  In addition, I have reviewed and discussed with patient certain preventive protocols, quality metrics, and best practice recommendations. A written personalized care plan for preventive services as well as general preventive health recommendations were provided to patient.     Willette Brace, LPN   [id number]   Nurse Notes: none

## 2022-05-07 ENCOUNTER — Other Ambulatory Visit: Payer: Self-pay | Admitting: Family Medicine

## 2022-05-07 ENCOUNTER — Telehealth: Payer: Self-pay | Admitting: Family Medicine

## 2022-05-07 MED ORDER — TRAZODONE HCL 50 MG PO TABS: 25.0000 mg | ORAL_TABLET | Freq: Every evening | ORAL | 3 refills | Status: DC | PRN

## 2022-05-07 MED ORDER — TRAZODONE HCL 50 MG PO TABS: 25.0000 mg | ORAL_TABLET | Freq: Every evening | ORAL | 0 refills | Status: DC | PRN

## 2022-05-07 NOTE — Telephone Encounter (Signed)
   LAST APPOINTMENT DATE:  Please schedule appointment if longer than 1 year 01/05/22  NEXT APPOINTMENT DATE: 05/15/22  MEDICATION:  traZODone (DESYREL) 50 MG tablet   Is the patient out of medication? Deatra Ina a few left-uses mail delivery pharmacy  PHARMACY:  [place], [place] Phone: [phone]  Fax: [phone]     Let patient know to contact pharmacy at the end of the day to make sure medication is ready.  Please notify patient to allow 48-72 hours to process

## 2022-05-07 NOTE — Telephone Encounter (Signed)
Patient called again re: Patient spoke with [facility] who told Patient they recommend that since Patient may run out of the following medication to request Dr. Yong Channel send a partial RX for traZODone (DESYREL) 50 MG tablet  to:  [facility] [place], [place] [address] Phone: [phone]  Fax: [phone]      so that Patient will not be without the above medication

## 2022-05-07 NOTE — Telephone Encounter (Signed)
Refills sent to both pharmacies.

## 2022-05-15 ENCOUNTER — Ambulatory Visit: Payer: Medicare Other | Admitting: Family Medicine

## 2022-05-25 DIAGNOSIS — W228XXA Striking against or struck by other objects, initial encounter: Secondary | ICD-10-CM | POA: Diagnosis not present

## 2022-05-25 DIAGNOSIS — S51811A Laceration without foreign body of right forearm, initial encounter: Secondary | ICD-10-CM | POA: Diagnosis not present

## 2022-06-18 ENCOUNTER — Ambulatory Visit (INDEPENDENT_AMBULATORY_CARE_PROVIDER_SITE_OTHER): Payer: Medicare Other | Admitting: Family Medicine

## 2022-06-18 ENCOUNTER — Encounter: Payer: Self-pay | Admitting: Family Medicine

## 2022-06-18 VITALS — BP 102/68 | HR 68 | Temp 97.8°F | Ht 68.0 in | Wt 168.4 lb

## 2022-06-18 DIAGNOSIS — I10 Essential (primary) hypertension: Secondary | ICD-10-CM

## 2022-06-18 DIAGNOSIS — E1149 Type 2 diabetes mellitus with other diabetic neurological complication: Secondary | ICD-10-CM

## 2022-06-18 DIAGNOSIS — F325 Major depressive disorder, single episode, in full remission: Secondary | ICD-10-CM | POA: Diagnosis not present

## 2022-06-18 DIAGNOSIS — E039 Hypothyroidism, unspecified: Secondary | ICD-10-CM | POA: Diagnosis not present

## 2022-06-18 DIAGNOSIS — Z23 Encounter for immunization: Secondary | ICD-10-CM

## 2022-06-18 DIAGNOSIS — E785 Hyperlipidemia, unspecified: Secondary | ICD-10-CM

## 2022-06-18 MED ORDER — TRAZODONE HCL 50 MG PO TABS: 25.0000 mg | ORAL_TABLET | Freq: Every evening | ORAL | 3 refills | Status: DC | PRN

## 2022-06-18 NOTE — Patient Instructions (Addendum)
Health Maintenance Due  Topic Date Due   OPHTHALMOLOGY EXAM  05/02/2021  Get updated eye exam and have them send [place] a copy  Please call [phone] to schedule a visit with [facility] Dr. Theodis Shove at our office - please tell the office you were directly referred by Dr. Yong Channel  Trial trazodone '50mg'$  for sleep instead of 25 mg  Please stop by lab before you go If you have mychart- we will send your results within 3 business days of [place] receiving them.  If you do not have mychart- we will call you about results within 5 business days of [place] receiving them.  *please also note that you will see labs on mychart as soon as they post. I will later go in and write notes on them- will say "notes from Dr. Yong Channel"   Recommended follow up: Return in about 4 months (around 10/17/2022) for followup or sooner if needed.Schedule b4 you leave.

## 2022-06-18 NOTE — Progress Notes (Signed)
Phone [phone] In person visit   Subjective:   Kim Singh is a 81 y.o. year old very pleasant female patient who presents for/with See problem oriented charting Chief Complaint  Patient presents with   Follow-up   Hypothyroidism   Diabetes    Past Medical History-  Patient Active Problem List   Diagnosis Date Noted   Ascending aortic aneurysm ([place]) 10/09/2021    Priority: High   Type II diabetes mellitus with neurological manifestations ([place]) 11/29/2013    Priority: High   Aortic atherosclerosis ([place]) 10/09/2021    Priority: Medium    Emphysema lung ([place]) 10/09/2021    Priority: Medium    Diabetic neuropathy ([place]) 08/22/2015    Priority: Medium    Depression 02/28/2015    Priority: Medium    Dyslipidemia 11/29/2013    Priority: Medium    Hypothyroidism 11/29/2013    Priority: Medium    Essential hypertension 11/29/2013    Priority: Medium    Insomnia 12/19/2018    Priority: Low   Former smoker 02/28/2015    Priority: Low   History of breast cancer 11/30/2013    Priority: Low   History of thyroid cancer 11/30/2013    Priority: Low   Orthostatic hypotension 11/29/2013    Priority: Low   Grief reaction 05/16/2017    Medications- reviewed and updated Current Outpatient Medications  Medication Sig Dispense Refill   CALCIUM-VITAMIN D PO Take 1 tablet by mouth 2 (two) times daily.      cetirizine (ZYRTEC) 5 MG tablet TAKE 1 TABLET DAILY 90 tablet 3   escitalopram (LEXAPRO) 10 MG tablet Take 1 tablet (10 mg total) by mouth daily. 90 tablet 3   levothyroxine (SYNTHROID) 88 MCG tablet TAKE 1 TABLET DAILY 90 tablet 3   Multiple Vitamin (MULTIVITAMIN) tablet Take 1 tablet by mouth daily.     Multiple Vitamins-Minerals (ICAPS LUTEIN & ZEAXANTH, MVI, PO) Take 1 tablet by mouth daily.     nadolol (CORGARD) 20 MG tablet TAKE 1 TABLET DAILY (Patient taking differently: Take 20 mg by mouth daily.) 90 tablet 3   Omega-3-acid Ethyl Esters (OMACOR PO) Take 1 tablet by  mouth 2 (two) times daily.      rosuvastatin (CRESTOR) 10 MG tablet TAKE 1 TABLET DAILY (Patient taking differently: Take 10 mg by mouth daily.) 90 tablet 3   vitamin B-12 (CYANOCOBALAMIN) 1000 MCG tablet Take 1,000 mcg by mouth once a week. Takes on Mondays     traZODone (DESYREL) 50 MG tablet Take 0.5-1 tablets (25-50 mg total) by mouth at bedtime as needed. 90 tablet 3   No current facility-administered medications for this visit.     Objective:  BP 102/68   Pulse 68   Temp 97.8 F (36.6 C)   Ht '5\' 8"'$  (1.727 m)   Wt 168 lb 6.4 oz (76.4 kg)   SpO2 100%   BMI 25.61 kg/m  Gen: NAD, resting comfortably CV: RRR no murmurs rubs or gallops Lungs: CTAB no crackles, wheeze, rhonchi  Ext: minimal edema Skin: warm, dry  Diabetic Foot Exam - Simple   Simple Foot Form Diabetic Foot exam was performed with the following findings: Yes 06/18/2022  3:50 PM  Visual Inspection No deformities, no ulcerations, no other skin breakdown bilaterally: Yes Sensation Testing Intact to touch and monofilament testing bilaterally: Yes Pulse Check Posterior Tibialis and Dorsalis pulse intact bilaterally: Yes Comments       Assessment and Plan   #social update- 3 weeks in October in [place]- enjoyed  it then family came here for thanksgiving- 5 lbs up- plans to work it back down  #Diabetes with neuropathy.  S: Most recently diet controlled -in the past on Metformin 1000 mg daily -Not on medicine for neuropathy- doesn't want to take anything Lab Results  Component Value Date   HGBA1C 6.5 01/05/2022   HGBA1C 6.6 (H) 09/05/2021   HGBA1C 6.4 03/06/2021  A/P: hopefully stable- update a1c today. Continue without meds for now Neuropathy still bothersome but wants to avoid meds   #Hypertension S: controlled on nadolol 20 mg - in the past on quinapril 5 mg (found to have a fungal lip issue and was not angioedema) BP Readings from Last 3 Encounters:  06/18/22 102/68  04/27/22 118/74  01/05/22  128/83  A/P: Controlled. Continue current medications.     Depression  s: Compliant with Lexapro 10 mg.  Loss of son to suicide in October 2018 contributes. -irritable at times, poor sleep at times    06/18/2022    3:11 PM 04/27/2022    1:43 PM 01/05/2022    1:58 PM  Depression screen PHQ 2/9  Decreased Interest 0 0 0  Down, Depressed, Hopeless 0 1 0  PHQ - 2 Score 0 1 0  Altered sleeping 0  0  Tired, decreased energy 0  0  Change in appetite 0  0  Feeling bad or failure about yourself  0  0  Trouble concentrating 0  0  Moving slowly or fidgety/restless 0  0  Suicidal thoughts 0  0  PHQ-9 Score 0  0  Difficult doing work/chores Not difficult at all  Not difficult at all  A/P:  Depression full remission per phq9 but she would like to feel better. Still some grieving and hoping to work through symptoms with better sleep- going to try  trazodone up to 50 mg- but we discussed possible transition to pristiq -has done therapy in past- willing to retrial with Dr. Theodis Shove  #Hyperlipidemia #Aortic atherosclerosis S: medication:  Crestor 10 mg daily. -ldl was 23 when also on zetia Lab Results  Component Value Date   CHOL 91 03/06/2021   HDL 37.70 (L) 03/06/2021   LDLCALC 23 03/06/2021   LDLDIRECT 97.0 01/05/2022   TRIG 148.0 03/06/2021   CHOLHDL 2 03/06/2021  A/P: very mild poor control last check- encouraged regular compliance- update full lipid panel today - hoping improved- could tweak dose if needed- prefer ldl under 70 with aortic atherosclerosis  #Hypothyroidism S: Compliant with levothyroxine 88 mcg -some fatigue but feels could be jet lag A/P: hopefully stable- update tsh today. Continue current meds for now  #Incidental finding of a sending aortic aneurysm at 4 cm with recommendation for annual follow-up by CTA or MR. noted march 2023- repeat 2024  Recommended follow up: Return in about 4 months (around 10/17/2022) for followup or sooner if needed.Schedule b4 you  leave. Future Appointments  Date Time Provider Mio  05/03/2023  1:00 PM [facility]   Lab/Order associations:   ICD-10-CM   1. Type II diabetes mellitus with neurological manifestations (HCC)  E11.49 Comprehensive metabolic panel    CBC with Differential/Platelet    Hemoglobin A1c    2. Hypothyroidism, unspecified type  E03.9 TSH    3. Essential hypertension  I10     4. Dyslipidemia  E78.5 Lipid panel    5. Major depressive disorder with single episode, in full remission ([place])  F32.5 Ambulatory referral to Psychology    6. Need  for immunization against influenza  Z23 Flu Vaccine QUAD High Dose(Fluad)      Meds ordered this encounter  Medications   traZODone (DESYREL) 50 MG tablet    Sig: Take 0.5-1 tablets (25-50 mg total) by mouth at bedtime as needed.    Dispense:  90 tablet    Refill:  3   Return precautions advised.  Garret Reddish, MD

## 2022-06-19 LAB — CBC WITH DIFFERENTIAL/PLATELET
Basophils Absolute: 0.1 10*3/uL (ref 0.0–0.1)
Basophils Relative: 0.9 % (ref 0.0–3.0)
Eosinophils Absolute: 0.3 10*3/uL (ref 0.0–0.7)
Eosinophils Relative: 3.7 % (ref 0.0–5.0)
HCT: 45.3 % (ref 36.0–46.0)
Hemoglobin: 15.4 g/dL — ABNORMAL HIGH (ref 12.0–15.0)
Lymphocytes Relative: 18.4 % (ref 12.0–46.0)
Lymphs Abs: 1.3 10*3/uL (ref 0.7–4.0)
MCHC: 34.1 g/dL (ref 30.0–36.0)
MCV: 97 fl (ref 78.0–100.0)
Monocytes Absolute: 0.8 10*3/uL (ref 0.1–1.0)
Monocytes Relative: 11.2 % (ref 3.0–12.0)
Neutro Abs: 4.6 10*3/uL (ref 1.4–7.7)
Neutrophils Relative %: 65.8 % (ref 43.0–77.0)
Platelets: 208 10*3/uL (ref 150.0–400.0)
RBC: 4.67 Mil/uL (ref 3.87–5.11)
RDW: 12.9 % (ref 11.5–15.5)
WBC: 7 10*3/uL (ref 4.0–10.5)

## 2022-06-19 LAB — COMPREHENSIVE METABOLIC PANEL
ALT: 31 U/L (ref 0–35)
AST: 30 U/L (ref 0–37)
Albumin: 4.4 g/dL (ref 3.5–5.2)
Alkaline Phosphatase: 67 U/L (ref 39–117)
BUN: 20 mg/dL (ref 6–23)
CO2: 36 mEq/L — ABNORMAL HIGH (ref 19–32)
Calcium: 10.5 mg/dL (ref 8.4–10.5)
Chloride: 97 mEq/L (ref 96–112)
Creatinine, Ser: 1 mg/dL (ref 0.40–1.20)
GFR: 52.06 mL/min — ABNORMAL LOW (ref >60.00)
Glucose, Bld: 120 mg/dL — ABNORMAL HIGH (ref 70–99)
Potassium: 4.1 mEq/L (ref 3.5–5.1)
Sodium: 140 mEq/L (ref 135–145)
Total Bilirubin: 1 mg/dL (ref 0.2–1.2)
Total Protein: 7.2 g/dL (ref 6.0–8.3)

## 2022-06-19 LAB — LDL CHOLESTEROL, DIRECT: Direct LDL: 107 mg/dL

## 2022-06-19 LAB — HEMOGLOBIN A1C: Hgb A1c MFr Bld: 6.6 % — ABNORMAL HIGH (ref 4.6–6.5)

## 2022-06-19 LAB — LIPID PANEL
Cholesterol: 191 mg/dL (ref 0–200)
HDL: 44.4 mg/dL (ref >39.00)
NonHDL: 146.61
Total CHOL/HDL Ratio: 4
Triglycerides: 341 mg/dL — ABNORMAL HIGH (ref 0.0–149.0)
VLDL: 68.2 mg/dL — ABNORMAL HIGH (ref 0.0–40.0)

## 2022-06-19 LAB — TSH: TSH: 9.87 u[IU]/mL — ABNORMAL HIGH (ref 0.35–5.50)

## 2022-06-25 ENCOUNTER — Other Ambulatory Visit: Payer: Self-pay

## 2022-06-25 DIAGNOSIS — E039 Hypothyroidism, unspecified: Secondary | ICD-10-CM

## 2022-06-25 MED ORDER — LEVOTHYROXINE SODIUM 100 MCG PO TABS: 100.0000 ug | ORAL_TABLET | Freq: Every day | ORAL | 0 refills | Status: DC

## 2022-06-25 NOTE — Progress Notes (Signed)
Patient just arrived at dentist. Wants call back tomorrow

## 2022-06-25 NOTE — Addendum Note (Signed)
Addended by: Verne Grain on: 06/25/2022 03:33 PM   Modules accepted: Orders

## 2022-06-26 ENCOUNTER — Other Ambulatory Visit: Payer: Self-pay

## 2022-06-26 ENCOUNTER — Encounter: Payer: Self-pay | Admitting: Family Medicine

## 2022-06-26 DIAGNOSIS — E039 Hypothyroidism, unspecified: Secondary | ICD-10-CM

## 2022-06-26 MED ORDER — LEVOTHYROXINE SODIUM 100 MCG PO TABS: 100.0000 ug | ORAL_TABLET | Freq: Every day | ORAL | 0 refills | Status: DC

## 2022-07-03 DIAGNOSIS — Z7984 Long term (current) use of oral hypoglycemic drugs: Secondary | ICD-10-CM | POA: Diagnosis not present

## 2022-07-03 DIAGNOSIS — E119 Type 2 diabetes mellitus without complications: Secondary | ICD-10-CM | POA: Diagnosis not present

## 2022-07-03 LAB — HM DIABETES EYE EXAM

## 2022-07-18 ENCOUNTER — Other Ambulatory Visit: Payer: Self-pay | Admitting: Family Medicine

## 2022-07-18 DIAGNOSIS — E039 Hypothyroidism, unspecified: Secondary | ICD-10-CM

## 2022-08-02 ENCOUNTER — Other Ambulatory Visit (INDEPENDENT_AMBULATORY_CARE_PROVIDER_SITE_OTHER): Payer: Medicare Other

## 2022-08-02 DIAGNOSIS — E039 Hypothyroidism, unspecified: Secondary | ICD-10-CM | POA: Diagnosis not present

## 2022-08-03 LAB — TSH: TSH: 0.4 u[IU]/mL (ref 0.35–5.50)

## 2022-08-07 ENCOUNTER — Ambulatory Visit (INDEPENDENT_AMBULATORY_CARE_PROVIDER_SITE_OTHER): Payer: Medicare Other | Admitting: Family Medicine

## 2022-08-07 ENCOUNTER — Encounter: Payer: Self-pay | Admitting: Family Medicine

## 2022-08-07 VITALS — BP 130/74 | HR 84 | Temp 97.3°F | Ht 68.0 in | Wt 168.4 lb

## 2022-08-07 DIAGNOSIS — E039 Hypothyroidism, unspecified: Secondary | ICD-10-CM | POA: Diagnosis not present

## 2022-08-07 DIAGNOSIS — E1142 Type 2 diabetes mellitus with diabetic polyneuropathy: Secondary | ICD-10-CM | POA: Diagnosis not present

## 2022-08-07 DIAGNOSIS — I700 Atherosclerosis of aorta: Secondary | ICD-10-CM

## 2022-08-07 DIAGNOSIS — F325 Major depressive disorder, single episode, in full remission: Secondary | ICD-10-CM | POA: Diagnosis not present

## 2022-08-07 DIAGNOSIS — J439 Emphysema, unspecified: Secondary | ICD-10-CM | POA: Diagnosis not present

## 2022-08-07 NOTE — Patient Instructions (Addendum)
Please call [phone] to schedule a visit with [facility] to see if they can get you with Dr. Theodis Shove instead  Thyroid looks great!   Recommended follow up: Return in about 6 months (around 02/05/2023) for followup or sooner if needed.Schedule b4 you leave.

## 2022-08-07 NOTE — Progress Notes (Signed)
Phone [phone] In person visit   Subjective:   Kim Singh is a 82 y.o. year old very pleasant female patient who presents for/with See problem oriented charting Chief Complaint  Patient presents with   Hypothyroidism    Pt is here to f/u on TSH   Past Medical History-  Patient Active Problem List   Diagnosis Date Noted   Ascending aortic aneurysm ([place]) 10/09/2021    Priority: High   Type II diabetes mellitus with neurological manifestations ([place]) 11/29/2013    Priority: High   Aortic atherosclerosis ([place]) 10/09/2021    Priority: Medium    Emphysema lung ([place]) 10/09/2021    Priority: Medium    Diabetic neuropathy ([place]) 08/22/2015    Priority: Medium    Depression 02/28/2015    Priority: Medium    Dyslipidemia 11/29/2013    Priority: Medium    Hypothyroidism 11/29/2013    Priority: Medium    Essential hypertension 11/29/2013    Priority: Medium    Insomnia 12/19/2018    Priority: Low   Former smoker 02/28/2015    Priority: Low   History of breast cancer 11/30/2013    Priority: Low   History of thyroid cancer 11/30/2013    Priority: Low   Orthostatic hypotension 11/29/2013    Priority: Low   Grief reaction 05/16/2017    Medications- reviewed and updated Current Outpatient Medications  Medication Sig Dispense Refill   CALCIUM-VITAMIN D PO Take 1 tablet by mouth 2 (two) times daily.      cetirizine (ZYRTEC) 5 MG tablet TAKE 1 TABLET DAILY 90 tablet 3   escitalopram (LEXAPRO) 10 MG tablet Take 1 tablet (10 mg total) by mouth daily. 90 tablet 3   levothyroxine (SYNTHROID) 100 MCG tablet TAKE 1 TABLET DAILY 30 tablet 11   Multiple Vitamin (MULTIVITAMIN) tablet Take 1 tablet by mouth daily.     Multiple Vitamins-Minerals (ICAPS LUTEIN & ZEAXANTH, MVI, PO) Take 1 tablet by mouth daily.     nadolol (CORGARD) 20 MG tablet TAKE 1 TABLET DAILY (Patient taking differently: Take 20 mg by mouth daily.) 90 tablet 3   Omega-3-acid Ethyl Esters (OMACOR PO) Take 1 tablet by  mouth 2 (two) times daily.      rosuvastatin (CRESTOR) 10 MG tablet TAKE 1 TABLET DAILY (Patient taking differently: Take 10 mg by mouth daily.) 90 tablet 3   traZODone (DESYREL) 50 MG tablet Take 0.5-1 tablets (25-50 mg total) by mouth at bedtime as needed. 90 tablet 3   vitamin B-12 (CYANOCOBALAMIN) 1000 MCG tablet Take 1,000 mcg by mouth once a week. Takes on Mondays     No current facility-administered medications for this visit.     Objective:  BP 130/74   Pulse 84   Temp (!) 97.3 F (36.3 C)   Ht '5\' 8"'$  (1.727 m)   Wt 168 lb 6.4 oz (76.4 kg)   SpO2 95%   BMI 25.61 kg/m  Gen: NAD, resting comfortably CV: RRR no murmurs rubs or gallops Lungs: CTAB no crackles, wheeze, rhonchi Ext: trace edema Skin: warm, dry     Assessment and Plan   #Hypothyroidism S: Compliant with levothyroxine 100 mcg.  At last visit had been on 88 mcg Lab Results  Component Value Date   TSH 0.40 08/02/2022  A/P: Controlled. Continue current medications-has done well with increase in levothyroxine to 88 mcg..  We had already sent her results but she wanted to discuss in person  #Diabetes with neuropathy S: Most recently diet controlled -in the  past on Metformin 1000 mg daily -Not on medicine for neuropathy- doesn't want to take anything-she does continue to experience pain Lab Results  Component Value Date   HGBA1C 6.6 (H) 06/18/2022   HGBA1C 6.5 01/05/2022   HGBA1C 6.6 (H) 09/05/2021   A/P: stable- continue current medicines   -In regards to neuropathy we discussed potential treatments but ultimately she opted out  #Hypertension S: controlled on nadolol 20 mg - in the past on quinapril 5 mg (found to have a fungal lip issue and was not angioedema) BP Readings from Last 3 Encounters:  08/07/22 130/74  06/18/22 102/68  04/27/22 118/74  A/P: stable- continue current medicines     Depression  s: Compliant with Lexapro 10 mg.  Loss of son to suicide in October 2018 contributes. -trazodone  for sleep still helpful - referred to behavioral health- has visit on 26th of month with Bambi Cottle    06/18/2022    3:11 PM 04/27/2022    1:43 PM 01/05/2022    1:58 PM  Depression screen PHQ 2/9  Decreased Interest 0 0 0  Down, Depressed, Hopeless 0 1 0  PHQ - 2 Score 0 1 0  Altered sleeping 0  0  Tired, decreased energy 0  0  Change in appetite 0  0  Feeling bad or failure about yourself  0  0  Trouble concentrating 0  0  Moving slowly or fidgety/restless 0  0  Suicidal thoughts 0  0  PHQ-9 Score 0  0  Difficult doing work/chores Not difficult at all  Not difficult at all  A/P: Appears to be in full remission - Still wants to do counseling particularly with prior loss of son-she is going to reach back out to try to get set up with Dr. Theodis Shove in our office as this location is more familiar to her  #Hyperlipidemia #Aortic atherosclerosis S: medication:  Crestor 10 mg daily. -ldl was 23 when also on zetia Lab Results  Component Value Date   CHOL 191 06/18/2022   HDL 44.40 06/18/2022   [place] 23 03/06/2021   LDLDIRECT 107.0 06/18/2022   TRIG 341.0 (H) 06/18/2022   CHOLHDL 4 06/18/2022  A/P: Reasonable control on last check though LDL is slightly above 100 and would prefer at least under 100 if not under 70-at that time she was also hypothyroid which could have affected labs-we opted to continue current medication  #Asymptomatic emphysema-incidental finding on CT imaging-no symptoms reported.  She does have some fatigue but did not report shortness of breath  Recommended follow up: Return in about 6 months (around 02/05/2023) for followup or sooner if needed.Schedule b4 you leave. Future Appointments  Date Time Provider [place]  08/17/2022  2:00 PM Cottle, Lucious [place], [place] [facility] None  08/28/2022  3:00 PM Cottle, Bambi G, LCSW [facility] None  09/11/2022  2:00 PM Cottle, Bambi G, LCSW [facility] None  05/03/2023  1:00 PM [facility] HEALTH COACH [facility]   Lab/Order  associations:   ICD-10-CM   1. Hypothyroidism, unspecified type  E03.9     2. Diabetic polyneuropathy associated with type 2 diabetes mellitus (HCC)  E11.42     3. Aortic atherosclerosis (HCC) Chronic I70.0     4. Major depressive disorder with single episode, in full remission ([place]) Chronic F32.5     5. Pulmonary emphysema, unspecified emphysema type ([place]) Chronic J43.9      Return precautions advised.  Garret Reddish, MD

## 2022-08-17 ENCOUNTER — Ambulatory Visit: Payer: Medicare Other | Admitting: Psychology

## 2022-08-20 ENCOUNTER — Other Ambulatory Visit: Payer: Self-pay | Admitting: Family Medicine

## 2022-08-28 ENCOUNTER — Ambulatory Visit: Payer: Medicare Other | Admitting: Psychology

## 2022-09-11 ENCOUNTER — Ambulatory Visit: Payer: Medicare Other | Admitting: Psychology

## 2022-09-17 ENCOUNTER — Other Ambulatory Visit: Payer: Self-pay | Admitting: Family Medicine

## 2022-09-19 ENCOUNTER — Ambulatory Visit (INDEPENDENT_AMBULATORY_CARE_PROVIDER_SITE_OTHER): Payer: Medicare Other | Admitting: Physician Assistant

## 2022-09-19 ENCOUNTER — Encounter: Payer: Self-pay | Admitting: Physician Assistant

## 2022-09-19 VITALS — BP 114/70 | HR 63 | Temp 97.5°F | Ht 68.0 in | Wt 166.5 lb

## 2022-09-19 DIAGNOSIS — R051 Acute cough: Secondary | ICD-10-CM

## 2022-09-19 MED ORDER — PREDNISONE 20 MG PO TABS: 40.0000 mg | ORAL_TABLET | Freq: Every day | ORAL | 0 refills | Status: DC

## 2022-09-19 MED ORDER — AMOXICILLIN-POT CLAVULANATE 875-125 MG PO TABS: 1.0000 | ORAL_TABLET | Freq: Two times a day (BID) | ORAL | 0 refills | Status: DC

## 2022-09-19 NOTE — Progress Notes (Signed)
Kim Singh is a 82 y.o. female here for a new problem.  History of Present Illness:   Chief Complaint  Patient presents with   Cough    Pt c/o cough x  4 days, deep cough. Denies fever or chills. Took Nyquil last night.    URI Reports experiencing cough since 09/15/22. Denies fever, chills, decreased appetite, night sweats, sore throat, chest pain, headaches, rash, shortness of breath. Taking NyQuil, DayQuil, Delsym at home. Had pneumonia infection last year that required hospitalization.   Past Medical History:  Diagnosis Date   Breast cancer ([place]) 2001   Left   Diabetes mellitus type 2, uncomplicated (HCC)    Personal history of chemotherapy    Personal history of radiation therapy    Pure hypercholesterolemia    Unspecified disorder of thyroid    Unspecified essential hypertension      Social History   Tobacco Use   Smoking status: Former    Packs/day: 1.00    Years: 41.00    Total pack years: 41.00    Types: Cigarettes    Quit date: 08/02/1997    Years since quitting: 25.1   Smokeless tobacco: Never  Vaping Use   Vaping Use: Never used  Substance Use Topics   Alcohol use: Yes    Alcohol/week: 7.0 standard drinks of alcohol    Types: 7 Glasses of wine per week    Comment: glass of wine every day   Drug use: No    Past Surgical History:  Procedure Laterality Date   BREAST EXCISIONAL BIOPSY Left    BREAST LUMPECTOMY Left 2001   THYROIDECTOMY     total    Family History  Problem Relation Age of Onset   Diabetes Mother    Hearing loss Father    Cancer Sister        Bile Duct Cancer   Depression Son     Allergies  Allergen Reactions   Codeine Other (See Comments)    GI upset    Current Medications:   Current Outpatient Medications:    CALCIUM-VITAMIN D PO, Take 1 tablet by mouth 2 (two) times daily. , Disp: , Rfl:    cetirizine (ZYRTEC) 5 MG tablet, TAKE 1 TABLET DAILY, Disp: 90 tablet, Rfl: 3   escitalopram (LEXAPRO) 10 MG tablet, TAKE 1  TABLET DAILY, Disp: 90 tablet, Rfl: 3   levothyroxine (SYNTHROID) 100 MCG tablet, TAKE 1 TABLET DAILY, Disp: 30 tablet, Rfl: 11   Multiple Vitamin (MULTIVITAMIN) tablet, Take 1 tablet by mouth daily., Disp: , Rfl:    nadolol (CORGARD) 20 MG tablet, TAKE 1 TABLET DAILY, Disp: 90 tablet, Rfl: 3   Omega-3-acid Ethyl Esters (OMACOR PO), Take 1 tablet by mouth 2 (two) times daily. , Disp: , Rfl:    rosuvastatin (CRESTOR) 10 MG tablet, TAKE 1 TABLET DAILY, Disp: 90 tablet, Rfl: 3   traZODone (DESYREL) 50 MG tablet, Take 0.5-1 tablets (25-50 mg total) by mouth at bedtime as needed., Disp: 90 tablet, Rfl: 3   vitamin B-12 (CYANOCOBALAMIN) 1000 MCG tablet, Take 1,000 mcg by mouth once a week. Takes on Mondays, Disp: , Rfl:    Multiple Vitamins-Minerals (ICAPS LUTEIN & ZEAXANTH, MVI, PO), Take 1 tablet by mouth daily. (Patient not taking: Reported on 09/19/2022), Disp: , Rfl:    Review of Systems:   Review of Systems  Constitutional:  Negative for chills, diaphoresis (Night sweats), fever and malaise/fatigue.  HENT:  Negative for congestion and sore throat.   Eyes:  Negative for  blurred vision.  Respiratory:  Positive for cough. Negative for shortness of breath.   Cardiovascular:  Negative for chest pain, palpitations and leg swelling.  Gastrointestinal:  Negative for vomiting.  Musculoskeletal:  Negative for back pain.  Skin:  Negative for rash.  Neurological:  Negative for loss of consciousness and headaches.    Vitals:   Vitals:   09/19/22 1131  BP: 114/70  Pulse: 63  Temp: (!) 97.5 F (36.4 C)  TempSrc: Temporal  SpO2: 94%  Weight: 166 lb 8 oz (75.5 kg)  Height: '5\' 8"'$  (1.727 m)     Body mass index is 25.32 kg/m.  Physical Exam:   Physical Exam Vitals and nursing note reviewed.  Constitutional:      General: She is not in acute distress.    Appearance: She is well-developed. She is not ill-appearing or toxic-appearing.  HENT:     Head: Normocephalic and atraumatic.     Right  Ear: Tympanic membrane, ear canal and external ear normal. Tympanic membrane is not erythematous, retracted or bulging.     Left Ear: Tympanic membrane, ear canal and external ear normal. Tympanic membrane is not erythematous, retracted or bulging.     Nose: Nose normal.     Right Sinus: No maxillary sinus tenderness or frontal sinus tenderness.     Left Sinus: No maxillary sinus tenderness or frontal sinus tenderness.     Mouth/Throat:     Pharynx: Uvula midline. No posterior oropharyngeal erythema.  Eyes:     General: Lids are normal.     Conjunctiva/sclera: Conjunctivae normal.  Neck:     Trachea: Trachea normal.  Cardiovascular:     Rate and Rhythm: Normal rate and regular rhythm.     Heart sounds: Normal heart sounds, S1 normal and S2 normal.  Pulmonary:     Effort: Pulmonary effort is normal.     Breath sounds: Examination of the right-upper field reveals wheezing and rhonchi. Examination of the right-middle field reveals wheezing and rhonchi. Examination of the right-lower field reveals wheezing and rhonchi. Wheezing and rhonchi present. No decreased breath sounds or rales.  Lymphadenopathy:     Cervical: No cervical adenopathy.  Skin:    General: Skin is warm and dry.  Neurological:     Mental Status: She is alert.  Psychiatric:        Speech: Speech normal.        Behavior: Behavior normal. Behavior is cooperative.     Assessment and Plan:   Acute cough No red flags on exam.  Will initiate augmentin and oral prednisone per orders. Discussed taking medications as prescribed. Reviewed return precautions including worsening fever, SOB, worsening cough or other concerns. Push fluids and rest. I recommend that patient follow-up if symptoms worsen or persist despite treatment x 7-10 days, sooner if needed.  Consider CXR if no improvement  If worsening, needs to go to ER   I,Alexander Ruley,acting as a scribe for [employer], PA.,have documented all relevant  documentation on the behalf of Inda Coke, PA,as directed by  Inda Coke, PA while in the presence of Inda Coke, [place].  I, Inda Coke, [place], have reviewed all documentation for this visit. The documentation on 09/19/22 for the exam, diagnosis, procedures, and orders are all accurate and complete.    Inda Coke, PA-C

## 2022-09-19 NOTE — Patient Instructions (Signed)
It was great to see you!  Start antibiotic and prednisone  Start mucinex -- main ingredient is guaifenesin -- take this to get your mucous out  Push fluids and rest  If absolutely no improvement by Friday or any worsening -- CALL us If persistent high fever or shortness of breath -- go to the ER     Take care,  Inda Coke PA-C

## 2022-09-26 ENCOUNTER — Ambulatory Visit (INDEPENDENT_AMBULATORY_CARE_PROVIDER_SITE_OTHER): Payer: Medicare Other | Admitting: Physician Assistant

## 2022-09-26 ENCOUNTER — Ambulatory Visit (INDEPENDENT_AMBULATORY_CARE_PROVIDER_SITE_OTHER)
Admission: RE | Admit: 2022-09-26 | Discharge: 2022-09-26 | Disposition: A | Discharge status: Home / Self Care | Payer: Medicare Other | Source: Ambulatory Visit | Attending: Physician Assistant | Admitting: Physician Assistant

## 2022-09-26 ENCOUNTER — Ambulatory Visit: Payer: Medicare Other | Admitting: Behavioral Health

## 2022-09-26 ENCOUNTER — Encounter: Payer: Self-pay | Admitting: Physician Assistant

## 2022-09-26 VITALS — BP 130/80 | HR 63 | Temp 97.3°F | Ht 68.0 in | Wt 160.5 lb

## 2022-09-26 DIAGNOSIS — R051 Acute cough: Secondary | ICD-10-CM

## 2022-09-26 DIAGNOSIS — J189 Pneumonia, unspecified organism: Secondary | ICD-10-CM | POA: Diagnosis not present

## 2022-09-26 MED ORDER — SPACER/AERO-HOLD CHAMBER MASK MISC: 0 refills | Status: DC

## 2022-09-26 MED ORDER — ALBUTEROL SULFATE HFA 108 (90 BASE) MCG/ACT IN AERS: 2.0000 | INHALATION_SPRAY | Freq: Four times a day (QID) | RESPIRATORY_TRACT | 0 refills | Status: DC | PRN

## 2022-09-26 MED ORDER — AZITHROMYCIN 250 MG PO TABS: ORAL_TABLET | ORAL | 0 refills | Status: AC

## 2022-09-26 NOTE — Progress Notes (Signed)
Kim Singh is a 82 y.o. female here for a follow-up of a pre-existing condition.  History of Present Illness:   Chief Complaint  Patient presents with   Cough    Pt c/o cough and congestion, hearing rattling and wheezing at night. Also having fatigue, finished prednisone on antibiotic yet. Also taking Mucine Max.    HPI  Upper Respiratory Infection Treated here for acute cough on 09/19/22 with augmentin and prednisone.  Has been taking augmentin and prednisone as directed. Still has a cough and congestion. Taking Mucinex. She is checking her oxygen at home, per her daughter. Oxygen has been in upper 80's and low 90's  Denies: confusion, n/v/d, fevers, SOB, poor oral intake   Past Medical History:  Diagnosis Date   Breast cancer ([place]) 2001   Left   Diabetes mellitus type 2, uncomplicated (HCC)    Personal history of chemotherapy    Personal history of radiation therapy    Pure hypercholesterolemia    Unspecified disorder of thyroid    Unspecified essential hypertension      Social History   Tobacco Use   Smoking status: Former    Packs/day: 1.00    Years: 41.00    Total pack years: 41.00    Types: Cigarettes    Quit date: 08/02/1997    Years since quitting: 25.1   Smokeless tobacco: Never  Vaping Use   Vaping Use: Never used  Substance Use Topics   Alcohol use: Yes    Alcohol/week: 7.0 standard drinks of alcohol    Types: 7 Glasses of wine per week    Comment: glass of wine every day   Drug use: No    Past Surgical History:  Procedure Laterality Date   BREAST EXCISIONAL BIOPSY Left    BREAST LUMPECTOMY Left 2001   THYROIDECTOMY     total    Family History  Problem Relation Age of Onset   Diabetes Mother    Hearing loss Father    Cancer Sister        Bile Duct Cancer   Depression Son     Allergies  Allergen Reactions   Codeine Other (See Comments)    GI upset    Current Medications:   Current Outpatient Medications:     amoxicillin-clavulanate (AUGMENTIN) 875-125 MG tablet, Take 1 tablet by mouth 2 (two) times daily., Disp: 20 tablet, Rfl: 0   CALCIUM-VITAMIN D PO, Take 1 tablet by mouth 2 (two) times daily. , Disp: , Rfl:    cetirizine (ZYRTEC) 5 MG tablet, TAKE 1 TABLET DAILY, Disp: 90 tablet, Rfl: 3   escitalopram (LEXAPRO) 10 MG tablet, TAKE 1 TABLET DAILY, Disp: 90 tablet, Rfl: 3   levothyroxine (SYNTHROID) 100 MCG tablet, TAKE 1 TABLET DAILY, Disp: 30 tablet, Rfl: 11   Multiple Vitamin (MULTIVITAMIN) tablet, Take 1 tablet by mouth daily., Disp: , Rfl:    nadolol (CORGARD) 20 MG tablet, TAKE 1 TABLET DAILY, Disp: 90 tablet, Rfl: 3   Omega-3-acid Ethyl Esters (OMACOR PO), Take 1 tablet by mouth 2 (two) times daily. , Disp: , Rfl:    rosuvastatin (CRESTOR) 10 MG tablet, TAKE 1 TABLET DAILY, Disp: 90 tablet, Rfl: 3   traZODone (DESYREL) 50 MG tablet, Take 0.5-1 tablets (25-50 mg total) by mouth at bedtime as needed., Disp: 90 tablet, Rfl: 3   vitamin B-12 (CYANOCOBALAMIN) 1000 MCG tablet, Take 1,000 mcg by mouth once a week. Takes on Mondays, Disp: , Rfl:    Multiple Vitamins-Minerals ([place],  MVI, PO), Take 1 tablet by mouth daily. (Patient not taking: Reported on 09/19/2022), Disp: , Rfl:    Review of Systems:   Review of Systems  Constitutional:  Positive for malaise/fatigue. Negative for fever.  HENT:  Positive for congestion.   Eyes:  Negative for blurred vision.  Respiratory:  Positive for cough. Negative for shortness of breath.   Cardiovascular:  Negative for chest pain, palpitations and leg swelling.  Gastrointestinal:  Negative for vomiting.  Musculoskeletal:  Negative for back pain.  Skin:  Negative for rash.  Neurological:  Negative for loss of consciousness and headaches.    Vitals:   Vitals:   09/26/22 1321  BP: 130/80  Pulse: 63  Temp: (!) 97.3 F (36.3 C)  TempSrc: Temporal  SpO2: 92%  Weight: 160 lb 8 oz (72.8 kg)  Height: '5\' 8"'$  (1.727 m)     Body mass index  is 24.4 kg/m.  Physical Exam:   Physical Exam Vitals and nursing note reviewed.  Constitutional:      General: She is not in acute distress.    Appearance: She is well-developed. She is not ill-appearing or toxic-appearing.  Cardiovascular:     Rate and Rhythm: Normal rate and regular rhythm.     Pulses: Normal pulses.     Heart sounds: Normal heart sounds, S1 normal and S2 normal.  Pulmonary:     Effort: Pulmonary effort is normal.     Breath sounds: No decreased air movement or transmitted upper airway sounds. Examination of the right-middle field reveals rales. Examination of the right-lower field reveals rales. Wheezing and rales present.  Skin:    General: Skin is warm and dry.  Neurological:     Mental Status: She is alert.     GCS: GCS eye subscore is 4. GCS verbal subscore is 5. GCS motor subscore is 6.  Psychiatric:        Speech: Speech normal.        Behavior: Behavior normal. Behavior is cooperative.     Assessment and Plan:   Acute cough Worsening since my last visit Vitals overall stable Received albuterol nebulizer in office and tolerated well Briefly discussed with PCP, Garret Reddish Recommend starting azithromycin and albuterol inhaler prn Push fluids and rest LOW THRESHOLD to go to the ER -- handout provided with worsening signs/sx CXR Follow-up in 1 week for repeat exam Keep close eye on O2 at home and if persistently <88%, recommend ER eval  I,Alexander Ruley,acting as a scribe for [employer], PA.,have documented all relevant documentation on the behalf of Inda Coke, PA,as directed by  Inda Coke, PA while in the presence of Inda Coke, [place].  I, Inda Coke, [place], have reviewed all documentation for this visit. The documentation on 09/26/22 for the exam, diagnosis, procedures, and orders are all accurate and complete.  Inda Coke, PA-C

## 2022-09-26 NOTE — Progress Notes (Signed)
Kim Singh is a 82 y.o. female here for a 1 week follow-up.  History of Present Illness:   Chief Complaint  Patient presents with   Cough    Pt still coughing, no expectoration. Deneis fever or chills.    HPI  Pneumonia Seen here on 09/26/22 for acute cough. Treated with albuterol nebulizer in office. Prescribed azithromycin and albuterol inhaler as needed.  09/26/22 chest X-ray showed increasing reticulonodular opacities at the right lung base compatible with progressing pneumonia. Has stopped taking Mucinex and other OTC medications. Still has cough, fatigue. Denies fever, chills, sputum production. Sleeping well with Desyrel. Has been staying active. Overall much improved  Herpes Labialis Outbreak Reports she feels an outbreak starting on lower lips. Hx of outbreaks, has taken Valtrex in the past. Interested in receiving Valtrex.  Past Medical History:  Diagnosis Date   Breast cancer ([place]) 2001   Left   Diabetes mellitus type 2, uncomplicated (HCC)    Personal history of chemotherapy    Personal history of radiation therapy    Pure hypercholesterolemia    Unspecified disorder of thyroid    Unspecified essential hypertension      Social History   Tobacco Use   Smoking status: Former    Packs/day: 1.00    Years: 41.00    Total pack years: 41.00    Types: Cigarettes    Quit date: 08/02/1997    Years since quitting: 25.1   Smokeless tobacco: Never  Vaping Use   Vaping Use: Never used  Substance Use Topics   Alcohol use: Yes    Alcohol/week: 7.0 standard drinks of alcohol    Types: 7 Glasses of wine per week    Comment: glass of wine every day   Drug use: No    Past Surgical History:  Procedure Laterality Date   BREAST EXCISIONAL BIOPSY Left    BREAST LUMPECTOMY Left 2001   THYROIDECTOMY     total    Family History  Problem Relation Age of Onset   Diabetes Mother    Hearing loss Father    Cancer Sister        Bile Duct Cancer   Depression Son      Allergies  Allergen Reactions   Codeine Other (See Comments)    GI upset    Current Medications:   Current Outpatient Medications:    albuterol (VENTOLIN HFA) 108 (90 Base) MCG/ACT inhaler, Inhale 2 puffs into the lungs every 6 (six) hours as needed for wheezing or shortness of breath., Disp: 8 g, Rfl: 0   CALCIUM-VITAMIN D PO, Take 1 tablet by mouth 2 (two) times daily. , Disp: , Rfl:    cetirizine (ZYRTEC) 5 MG tablet, TAKE 1 TABLET DAILY, Disp: 90 tablet, Rfl: 3   escitalopram (LEXAPRO) 10 MG tablet, TAKE 1 TABLET DAILY, Disp: 90 tablet, Rfl: 3   levothyroxine (SYNTHROID) 100 MCG tablet, TAKE 1 TABLET DAILY, Disp: 30 tablet, Rfl: 11   Multiple Vitamin (MULTIVITAMIN) tablet, Take 1 tablet by mouth daily., Disp: , Rfl:    Multiple Vitamins-Minerals (ICAPS LUTEIN & ZEAXANTH, MVI, PO), Take 1 tablet by mouth daily., Disp: , Rfl:    nadolol (CORGARD) 20 MG tablet, TAKE 1 TABLET DAILY, Disp: 90 tablet, Rfl: 3   Omega-3-acid Ethyl Esters (OMACOR PO), Take 1 tablet by mouth 2 (two) times daily. , Disp: , Rfl:    rosuvastatin (CRESTOR) 10 MG tablet, TAKE 1 TABLET DAILY, Disp: 90 tablet, Rfl: 3   Spacer/Aero-Hold Chamber Mask MISC, Spacer  for inhaler, Disp: 1 each, Rfl: 0   traZODone (DESYREL) 50 MG tablet, Take 0.5-1 tablets (25-50 mg total) by mouth at bedtime as needed., Disp: 90 tablet, Rfl: 3   vitamin B-12 (CYANOCOBALAMIN) 1000 MCG tablet, Take 1,000 mcg by mouth once a week. Takes on Mondays, Disp: , Rfl:    Review of Systems:   Review of Systems  Constitutional:  Positive for malaise/fatigue. Negative for chills and fever.  HENT:  Negative for congestion.   Eyes:  Negative for blurred vision.  Respiratory:  Positive for cough. Negative for sputum production and shortness of breath.   Cardiovascular:  Negative for chest pain, palpitations and leg swelling.  Gastrointestinal:  Negative for vomiting.  Musculoskeletal:  Negative for back pain.  Skin:  Negative for rash.   Neurological:  Negative for loss of consciousness and headaches.    Vitals:   Vitals:   10/03/22 1502  BP: 130/80  Pulse: 61  Temp: (!) 97.5 F (36.4 C)  TempSrc: Temporal  SpO2: 93%  Weight: 163 lb 6.1 oz (74.1 kg)  Height: '5\' 8"'$  (1.727 m)     Body mass index is 24.84 kg/m.  Physical Exam:   Physical Exam Vitals and nursing note reviewed.  Constitutional:      General: She is not in acute distress.    Appearance: She is well-developed. She is not ill-appearing or toxic-appearing.  Cardiovascular:     Rate and Rhythm: Normal rate and regular rhythm.     Pulses: Normal pulses.     Heart sounds: Normal heart sounds, S1 normal and S2 normal.  Pulmonary:     Effort: Pulmonary effort is normal.     Breath sounds: Normal breath sounds.  Skin:    General: Skin is warm and dry.  Neurological:     Mental Status: She is alert.     GCS: GCS eye subscore is 4. GCS verbal subscore is 5. GCS motor subscore is 6.  Psychiatric:        Speech: Speech normal.        Behavior: Behavior normal. Behavior is cooperative.     Assessment and Plan:   Pneumonia of right lower lobe due to infectious organism Significant improvement Lungs CTA on my exam Recommend continued supportive care with prn spirometry and mucinex Push fluids and rest Follow-up with PCP if new/worsening sx  HSV-1 (herpes simplex virus 1) infection Will refill Valtrex for her to use for outbreaks Follow-up with PCP prn for this issue   I,Alexander Ruley,acting as a scribe for [employer], PA.,have documented all relevant documentation on the behalf of Kim Coke, PA,as directed by  Kim Coke, PA while in the presence of Kim Singh, [place].   I, Kim Singh, [place], have reviewed all documentation for this visit. The documentation on 10/03/22 for the exam, diagnosis, procedures, and orders are all accurate and complete.    Kim Coke, PA-C

## 2022-09-26 NOTE — Patient Instructions (Addendum)
It was great to see you!  I am concerned that you have pneumonia Complete the current antibiotic and please start the new antibiotic today I have sent in an inhaler for you to use as needed for your shortness of breath  Contact a health care provider if: You have a fever. You have trouble sleeping because you cannot control your cough with cough medicine. Get help right away if: Your shortness of breath becomes worse. Your chest pain increases. Your sickness becomes worse, especially if you are an older adult or have a weak immune system. You cough up blood. Your oxygen is consistently < 88%  An order for xray has been put in for you. To have this done, you can walk in at the [facility] location without a scheduled appointment.  The address is [address]. It is across the street from [facility]. Lab and x-xray are located in the basement.   Hours of operation are M-F 8:30am to 5:00pm.  Please note that they are closed for lunch between 12:30 and 1:00pm.   Take care,  Inda Coke PA-C

## 2022-09-27 ENCOUNTER — Encounter: Payer: Self-pay | Admitting: Physician Assistant

## 2022-09-27 NOTE — Telephone Encounter (Signed)
Please see message and advise 

## 2022-09-28 MED ORDER — ALBUTEROL SULFATE (2.5 MG/3ML) 0.083% IN NEBU
2.5000 mg | INHALATION_SOLUTION | Freq: Once | RESPIRATORY_TRACT | Status: AC
  Administered 2022-09-26: 2.5 mg via RESPIRATORY_TRACT

## 2022-09-28 NOTE — Addendum Note (Signed)
Addended byHildred Alamin on: 09/28/2022 02:08 PM   Modules accepted: Orders

## 2022-10-03 ENCOUNTER — Ambulatory Visit (INDEPENDENT_AMBULATORY_CARE_PROVIDER_SITE_OTHER): Payer: Medicare Other | Admitting: Physician Assistant

## 2022-10-03 ENCOUNTER — Encounter: Payer: Self-pay | Admitting: Physician Assistant

## 2022-10-03 VITALS — BP 130/80 | HR 61 | Temp 97.5°F | Ht 68.0 in | Wt 163.4 lb

## 2022-10-03 DIAGNOSIS — B009 Herpesviral infection, unspecified: Secondary | ICD-10-CM

## 2022-10-03 DIAGNOSIS — J189 Pneumonia, unspecified organism: Secondary | ICD-10-CM | POA: Diagnosis not present

## 2022-10-03 MED ORDER — VALACYCLOVIR HCL 1 G PO TABS: ORAL_TABLET | ORAL | 2 refills | Status: DC

## 2022-10-10 ENCOUNTER — Ambulatory Visit: Payer: Medicare Other | Admitting: Behavioral Health

## 2022-10-24 ENCOUNTER — Ambulatory Visit: Payer: Medicare Other | Admitting: Behavioral Health

## 2022-11-26 ENCOUNTER — Ambulatory Visit: Payer: Medicare Other | Admitting: Behavioral Health

## 2022-12-10 ENCOUNTER — Other Ambulatory Visit: Payer: Self-pay

## 2022-12-10 DIAGNOSIS — E039 Hypothyroidism, unspecified: Secondary | ICD-10-CM

## 2022-12-10 MED ORDER — LEVOTHYROXINE SODIUM 100 MCG PO TABS: 100.0000 ug | ORAL_TABLET | Freq: Every day | ORAL | 3 refills | Status: DC

## 2023-03-07 ENCOUNTER — Other Ambulatory Visit: Payer: Self-pay | Admitting: Family Medicine

## 2023-05-30 ENCOUNTER — Ambulatory Visit: Payer: Medicare Other

## 2023-05-30 VITALS — BP 110/72 | HR 63 | Temp 98.5°F | Wt 166.8 lb

## 2023-05-30 DIAGNOSIS — Z0000 Encounter for general adult medical examination without abnormal findings: Secondary | ICD-10-CM

## 2023-05-30 NOTE — Patient Instructions (Signed)
Kim Singh , Thank you for taking time to come for your Medicare Wellness Visit. I appreciate your ongoing commitment to your health goals. Please review the following plan we discussed and let me know if I can assist you in the future.   Referrals/Orders/Follow-Ups/Clinician Recommendations: maintain health and activity   This is a list of the screening recommended for you and due dates:  Health Maintenance  Topic Date Due   Yearly kidney health urinalysis for diabetes  09/05/2022   Hemoglobin A1C  12/17/2022   Flu Shot  02/21/2023   COVID-19 Vaccine (4 - 2023-24 season) 03/24/2023   Yearly kidney function blood test for diabetes  06/19/2023   Zoster (Shingles) Vaccine (1 of 2) 09/20/2023*   Complete foot exam   06/19/2023   Eye exam for diabetics  07/04/2023   Medicare Annual Wellness Visit  05/29/2024   DTaP/Tdap/Td vaccine (2 - Td or Tdap) 04/17/2026   Pneumonia Vaccine  Completed   DEXA scan (bone density measurement)  Completed   HPV Vaccine  Aged Out  *Topic was postponed. The date shown is not the original due date.    Advanced directives: (Copy Requested) Please bring a copy of your health care power of attorney and living will to the office to be added to your chart at your convenience.  Next Medicare Annual Wellness Visit scheduled for next year: Yes

## 2023-05-30 NOTE — Progress Notes (Signed)
Subjective:   Kim Singh is a 82 y.o. female who presents for Medicare Annual (Subsequent) preventive examination.  Visit Complete: In person  Cardiac Risk Factors include: advanced age (>72men, >66 women);hypertension;dyslipidemia;diabetes mellitus     Objective:    Today's Vitals   05/30/23 1359  BP: 110/72  Pulse: 63  Temp: 98.5 F (36.9 C)  SpO2: 93%  Weight: 166 lb 12.8 oz (75.7 kg)   Body mass index is 25.36 kg/m.     05/30/2023    2:11 PM 04/27/2022    1:45 PM 10/02/2021    8:00 PM 04/19/2021    2:16 PM 09/28/2019    2:06 PM 09/03/2018    1:07 PM 04/23/2017   10:53 AM  Advanced Directives  Does Patient Have a Medical Advance Directive? Yes Yes Yes Yes Yes Yes Yes  Type of Estate agent of [place];Living will Healthcare Power of [place];Living will Healthcare Power of [place];Living will Healthcare Power of [place];Living will Living will;Healthcare Power of [address] [employer] Power of [place];Living will Healthcare Power of [place];Living will  Does patient want to make changes to medical advance directive?    No - Patient declined No - Patient declined No - Patient declined Yes (MAU/Ambulatory/Procedural Areas - Information given)  Copy of Healthcare Power of Attorney in Chart? No - copy requested No - copy requested No - copy requested Yes - validated most recent copy scanned in chart (See row information) No - copy requested No - copy requested No - copy requested    Current Medications (verified) Outpatient Encounter Medications as of 05/30/2023  Medication Sig   CALCIUM-VITAMIN D PO Take 1 tablet by mouth 2 (two) times daily.    cetirizine (ZYRTEC) 5 MG tablet TAKE 1 TABLET DAILY   escitalopram (LEXAPRO) 10 MG tablet TAKE 1 TABLET DAILY   levothyroxine (SYNTHROID) 100 MCG tablet Take 1 tablet (100 mcg total) by mouth daily.   Multiple Vitamin (MULTIVITAMIN) tablet Take 1 tablet by mouth daily.   Multiple Vitamins-Minerals (ICAPS  LUTEIN & ZEAXANTH, MVI, PO) Take 1 tablet by mouth daily.   nadolol (CORGARD) 20 MG tablet TAKE 1 TABLET DAILY   Omega-3-acid Ethyl Esters (OMACOR PO) Take 1 tablet by mouth 2 (two) times daily.    rosuvastatin (CRESTOR) 10 MG tablet TAKE 1 TABLET DAILY   Spacer/Aero-Hold Chamber Mask MISC Spacer for inhaler   traZODone (DESYREL) 50 MG tablet Take 0.5-1 tablets (25-50 mg total) by mouth at bedtime as needed.   valACYclovir (VALTREX) 1000 MG tablet Take two tablets ( total 2000 mg) by mouth q12h x 1 day; Start: ASAP after symptom onset   vitamin B-12 (CYANOCOBALAMIN) 1000 MCG tablet Take 1,000 mcg by mouth once a week. Takes on Mondays   albuterol (VENTOLIN HFA) 108 (90 Base) MCG/ACT inhaler Inhale 2 puffs into the lungs every 6 (six) hours as needed for wheezing or shortness of breath. (Patient not taking: Reported on 05/30/2023)   No facility-administered encounter medications on file as of 05/30/2023.    Allergies (verified) Codeine   History: Past Medical History:  Diagnosis Date   Breast cancer (HCC) 2001   Left   Diabetes mellitus type 2, uncomplicated (HCC)    Personal history of chemotherapy    Personal history of radiation therapy    Pure hypercholesterolemia    Unspecified disorder of thyroid    Unspecified essential hypertension    Past Surgical History:  Procedure Laterality Date   BREAST EXCISIONAL BIOPSY Left    BREAST LUMPECTOMY Left  2001   THYROIDECTOMY     total   Family History  Problem Relation Age of Onset   Diabetes Mother    Hearing loss Father    Cancer Sister        Bile Duct Cancer   Depression Son    Social History   Socioeconomic History   Marital status: Widowed    Spouse name: Not on file   Number of children: Not on file   Years of education: Not on file   Highest education level: Not on file  Occupational History   Occupation: Retired  Tobacco Use   Smoking status: Former    Current packs/day: 0.00    Average packs/day: 1 pack/day  for 41.0 years (41.0 ttl pk-yrs)    Types: Cigarettes    Start date: 08/02/1956    Quit date: 08/02/1997    Years since quitting: 25.8   Smokeless tobacco: Never  Vaping Use   Vaping status: Never Used  Substance and Sexual Activity   Alcohol use: Yes    Alcohol/week: 7.0 standard drinks of alcohol    Types: 7 Glasses of wine per week    Comment: glass of wine every day   Drug use: No   Sexual activity: Not Currently  Other Topics Concern   Not on file  Social History Narrative   Widowed 2008. 3 children. 4 grandkids. No greatgrandkids.    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       Hobbies: in [place] had a widowed group, here joined a church with kids, Agricultural consultant work. Looking for friends network   Social Determinants of Health   Financial Resource Strain: Low Risk  (05/30/2023)   Overall Financial Resource Strain (CARDIA)    Difficulty of Paying Living Expenses: Not hard at all  Food Insecurity: No Food Insecurity (05/30/2023)   Hunger Vital Sign    Worried About Running Out of Food in the Last Year: Never true    Ran Out of Food in the Last Year: Never true  Transportation Needs: No Transportation Needs (05/30/2023)   PRAPARE - Administrator, Civil Service (Medical): No    Lack of Transportation (Non-Medical): No  Physical Activity: Inactive (05/30/2023)   Exercise Vital Sign    Days of Exercise per Week: 0 days    Minutes of Exercise per Session: 0 min  Stress: No Stress Concern Present (05/30/2023)   [employer] of Occupational Health - Occupational Stress Questionnaire    Feeling of Stress : Only a little  Social Connections: Moderately Isolated (05/30/2023)   Social Connection and Isolation Panel [NHANES]    Frequency of Communication with Friends and Family: More than three times a week    Frequency of Social  Gatherings with Friends and Family: More than three times a week    Attends Religious Services: 1 to 4 times per year    Active Member of [employer] or Organizations: No    Attends Banker Meetings: Never    Marital Status: Widowed    Tobacco Counseling Counseling given: Not Answered   Clinical Intake:  Pre-visit preparation completed: Yes  Pain : No/denies pain     BMI - recorded: 25.36 Nutritional Status: BMI 25 -29 Overweight Diabetes: Yes CBG done?: No Did pt. bring in CBG monitor from home?: No     Interpreter Needed?:  No  Information entered by :: Lanier Ensign, LPN   Activities of Daily Living    05/30/2023    2:05 PM  In your present state of health, do you have any difficulty performing the following activities:  Hearing? 1  Comment has hearing aids  Vision? 0  Difficulty concentrating or making decisions? 0  Walking or climbing stairs? 0  Dressing or bathing? 0  Doing errands, shopping? 0  Preparing Food and eating ? N  Using the Toilet? N  In the past six months, have you accidently leaked urine? N  Do you have problems with loss of bowel control? N  Managing your Medications? N  Managing your Finances? N  Housekeeping or managing your Housekeeping? N    Patient Care Team: Shelva Majestic, MD as PCP - General (Family Medicine) Gelene Mink, OD as Consulting Physician (Optometry) Drema Halon, MD (Inactive) as Consulting Physician (Otolaryngology)  Indicate any recent Medical Services you may have received from other than [facility] providers in the past year (date may be approximate).     Assessment:   This is a routine wellness examination for [place].  Hearing/Vision screen Hearing Screening - Comments:: Pt has hearing aids  Vision Screening - Comments:: Pt follows up with Dr Cherlynn Polo for annual eye exams    Goals Addressed   None    Depression Screen    05/30/2023    2:06 PM 09/26/2022    1:30 PM 06/18/2022    3:11 PM  04/27/2022    1:43 PM 01/05/2022    1:58 PM 09/05/2021   12:51 PM 04/19/2021    2:13 PM  PHQ 2/9 Scores  PHQ - 2 Score 1 0 0 1 0 0 0  PHQ- 9 Score  0 0  0 0 0    Fall Risk    05/30/2023    2:12 PM 04/27/2022    1:46 PM 04/19/2021    2:17 PM 03/06/2021    1:12 PM 09/28/2019   12:57 PM  Fall Risk   Falls in the past year? 0 0 0 0 0  Number falls in past yr: 0 0 0 0 0  Injury with Fall? 0 0 0 0 0  Risk for fall due to : Impaired vision No Fall Risks;Impaired vision;Impaired balance/gait No Fall Risks No Fall Risks   Follow up Falls prevention discussed Falls prevention discussed  Falls evaluation completed     MEDICARE RISK AT HOME: Medicare Risk at Home Any stairs in or around the home?: Yes If so, are there any without handrails?: No Home free of loose throw rugs in walkways, pet beds, electrical cords, etc?: Yes Adequate lighting in your home to reduce risk of falls?: Yes Life alert?: No Use of a cane, walker or w/c?: No Grab bars in the bathroom?: No Shower chair or bench in shower?: Yes Elevated toilet seat or a handicapped toilet?: No  TIMED UP AND GO:  Was the test performed?  Yes  Length of time to ambulate 10 feet: 10 sec Gait steady and fast without use of assistive device    Cognitive Function:    09/03/2018    1:14 PM 05/08/2016    2:21 PM  MMSE - Mini Mental State Exam  Not completed:  --  Orientation to time 5   Orientation to Place 5   Registration 3   Attention/ Calculation 5   Recall 3   Language- name 2 objects 2   Language- repeat 1   Language-  follow 3 step command 3   Language- read & follow direction 1   Write a sentence 1   Copy design 1   Total score 30         05/30/2023    2:12 PM 04/27/2022    1:49 PM 04/19/2021    2:19 PM 09/28/2019    2:07 PM  6CIT Screen  What Year? 0 points 0 points 0 points 0 points  What month? 0 points 0 points 0 points 0 points  What time? 0 points 0 points 0 points 0 points  Count back from 20 0 points 0  points 0 points 0 points  Months in reverse 0 points 0 points 0 points 0 points  Repeat phrase 0 points 0 points 0 points 0 points  Total Score 0 points 0 points 0 points 0 points    Immunizations Immunization History  Administered Date(s) Administered   Fluad Quad(high Dose 65+) 03/31/2019, 06/06/2021, 06/18/2022   Influenza, High Dose Seasonal PF 06/28/2014, 04/06/2016, 05/13/2017, 09/03/2018   PFIZER(Purple Top)SARS-COV-2 Vaccination 09/12/2019, 10/06/2019, 05/11/2020   Pneumococcal Conjugate-13 06/28/2014, 04/17/2016   Pneumococcal Polysaccharide-23 08/22/2015   Tdap 04/17/2016    TDAP status: Up to date  Flu Vaccine status: Up to date per pt will bring info unsure of date   Pneumococcal vaccine status: Up to date  Covid-19 vaccine status: Information provided on how to obtain vaccines.   Qualifies for Shingles Vaccine? Yes   Zostavax completed No   Shingrix Completed?: No.    Education has been provided regarding the importance of this vaccine. Patient has been advised to call insurance company to determine out of pocket expense if they have not yet received this vaccine. Advised may also receive vaccine at local pharmacy or Health Dept. Verbalized acceptance and understanding.  Screening Tests Health Maintenance  Topic Date Due   Diabetic kidney evaluation - Urine ACR  09/05/2022   HEMOGLOBIN A1C  12/17/2022   COVID-19 Vaccine (4 - 2023-24 season) 03/24/2023   Diabetic kidney evaluation - eGFR measurement  06/19/2023   Zoster Vaccines- Shingrix (1 of 2) 09/20/2023 (Originally 11/10/1959)   INFLUENZA VACCINE  10/21/2023 (Originally 02/21/2023)   FOOT EXAM  06/19/2023   OPHTHALMOLOGY EXAM  07/04/2023   Medicare Annual Wellness (AWV)  05/29/2024   DTaP/Tdap/Td (2 - Td or Tdap) 04/17/2026   Pneumonia Vaccine 32+ Years old  Completed   DEXA SCAN  Completed   HPV VACCINES  Aged Out    Health Maintenance  Health Maintenance Due  Topic Date Due   Diabetic kidney  evaluation - Urine ACR  09/05/2022   HEMOGLOBIN A1C  12/17/2022   COVID-19 Vaccine (4 - 2023-24 season) 03/24/2023   Diabetic kidney evaluation - eGFR measurement  06/19/2023    Colorectal cancer screening: No longer required.   Mammogram status: No longer required due to age .     Additional Screening:   Vision Screening: Recommended annual ophthalmology exams for early detection of glaucoma and other disorders of the eye. Is the patient up to date with their annual eye exam?  Yes  Who is the provider or what is the name of the office in which the patient attends annual eye exams? [facility] Dr Cherlynn Polo  If pt is not established with a provider, would they like to be referred to a provider to establish care? No .   Dental Screening: Recommended annual dental exams for proper oral hygiene  Diabetic Foot Exam: Diabetic Foot Exam: Completed 06/18/22  [address] [facility]  Referral / Chronic Care Management: CRR required this visit?  No   CCM required this visit?  No     Plan:     I have personally reviewed and noted the following in the patient's chart:   Medical and social history Use of alcohol, tobacco or illicit drugs  Current medications and supplements including opioid prescriptions. Patient is not currently taking opioid prescriptions. Functional ability and status Nutritional status Physical activity Advanced directives List of other physicians Hospitalizations, surgeries, and ER visits in previous 12 months Vitals Screenings to include cognitive, depression, and falls Referrals and appointments  In addition, I have reviewed and discussed with patient certain preventive protocols, quality metrics, and best practice recommendations. A written personalized care plan for preventive services as well as general preventive health recommendations were provided to patient.     Marzella Schlein, LPN   [id number]   After Visit Summary: (MyChart) Due to this being a  telephonic visit, the after visit summary with patients personalized plan was offered to patient via MyChart   Nurse Notes: none

## 2023-07-12 DIAGNOSIS — Z7984 Long term (current) use of oral hypoglycemic drugs: Secondary | ICD-10-CM | POA: Diagnosis not present

## 2023-07-12 DIAGNOSIS — E119 Type 2 diabetes mellitus without complications: Secondary | ICD-10-CM | POA: Diagnosis not present

## 2023-07-12 DIAGNOSIS — Z961 Presence of intraocular lens: Secondary | ICD-10-CM | POA: Diagnosis not present

## 2023-07-12 LAB — HM DIABETES EYE EXAM

## 2023-07-18 ENCOUNTER — Encounter: Payer: Self-pay | Admitting: Family Medicine

## 2023-07-18 ENCOUNTER — Ambulatory Visit: Payer: Self-pay | Admitting: Family Medicine

## 2023-07-18 ENCOUNTER — Ambulatory Visit: Payer: Medicare Other | Admitting: Family Medicine

## 2023-07-18 VITALS — BP 140/81 | HR 66 | Temp 97.5°F | Resp 18 | Ht 68.0 in | Wt 169.1 lb

## 2023-07-18 DIAGNOSIS — J069 Acute upper respiratory infection, unspecified: Secondary | ICD-10-CM | POA: Diagnosis not present

## 2023-07-18 MED ORDER — AMOXICILLIN-POT CLAVULANATE 875-125 MG PO TABS: 1.0000 | ORAL_TABLET | Freq: Two times a day (BID) | ORAL | 0 refills | Status: DC

## 2023-07-18 NOTE — Telephone Encounter (Signed)
Copied from CRM [id number]. Topic: Clinical - Red Word Triage >> Jul 18, 2023 10:25 AM Thomes Dinning wrote: Red Word that prompted transfer to Nurse Triage: Patient has sore throat and states in the past it has turned into pneumonia. She is looking to get an antibiotic or medication sent to the pharmacy.  Chief Complaint:  Sore throat x 3 days. Patient states int he past it has turned into Pneumonia.  She is requesting to get an antibiotic  or medication sent to the pharmacy. Patient allowed RN Agent to schedule her to be seen. Symptoms:  Patient  is able to swallow but it hurts.  Other symptoms, cough and runny nose - clear drainage. Frequency:Constant - Pertinent Negatives: Patient denies fever. Disposition: [] ED /[] Urgent Care (no appt availability in office) / [x] Appointment(In office/virtual)/ []  [facility]/ [] Home Care/ [] Refused Recommended Disposition /[] [facility]/ []  Follow-up with PCP Additional Notes:  Reason for Disposition  Diabetes mellitus or weak immune system (e.g., HIV positive, cancer chemo, splenectomy, organ transplant, chronic steroids)  Answer Assessment - Initial Assessment Questions 1. ONSET: "When did the throat start hurting?" (Hours or days ago)      Started on Tuesday 2. SEVERITY: "How bad is the sore throat?" (Scale 1-10; mild, moderate or severe)    - SEVERE (8-10):  Excruciating pain, interferes with most normal activities.   -3. STREP EXPOSURE: "Has there been any exposure to strep within the past week?" If Yes, ask: "What type of contact occurred?"      Denies 4.  VIRAL SYMPTOMS: "Are there any symptoms of a cold, such as a runny nose, cough, hoarse voice or red eyes?"       Cough and runny nose- runny nose is always. 5. FEVER: "Do you have a fever?" If Yes, ask: "What is your temperature, how was it measured, and when did it start?"     Denies Fever  6. PUS ON THE TONSILS: "Is there pus on the tonsils in the back of your throat?"      Denies 7. OTHER SYMPTOMS: "Do you have any other symptoms?" (e.g., difficulty breathing, headache, rash)     Denies  Protocols used: Sore Throat-A-AH

## 2023-07-18 NOTE — Patient Instructions (Signed)
Sent antibiotics  Worse, ER

## 2023-07-18 NOTE — Progress Notes (Signed)
Subjective:     Patient ID: Kim Singh, female    DOB: 05/28/1941, 82 y.o.   MRN: [id number]  Chief Complaint  Patient presents with   Sore Throat    Sore throat x 3 days, worse today    HPI Sore throat since 12/24.  Raspy voice.  Some cough, congestion.  H/o pneumonia.  Doesn't want any testing, just meds.  No f/c  min sob.  Hasn't tested for covid.  Sats were 88 upon arrival but quickly up to 92  Health Maintenance Due  Topic Date Due   Diabetic kidney evaluation - Urine ACR  09/05/2022   HEMOGLOBIN A1C  12/17/2022   Diabetic kidney evaluation - eGFR measurement  06/19/2023   FOOT EXAM  06/19/2023    Past Medical History:  Diagnosis Date   Breast cancer (HCC) 2001   Left   Diabetes mellitus type 2, uncomplicated (HCC)    Personal history of chemotherapy    Personal history of radiation therapy    Pure hypercholesterolemia    Unspecified disorder of thyroid    Unspecified essential hypertension     Past Surgical History:  Procedure Laterality Date   BREAST EXCISIONAL BIOPSY Left    BREAST LUMPECTOMY Left 2001   THYROIDECTOMY     total     Current Outpatient Medications:    albuterol (VENTOLIN HFA) 108 (90 Base) MCG/ACT inhaler, Inhale 2 puffs into the lungs every 6 (six) hours as needed for wheezing or shortness of breath., Disp: 8 g, Rfl: 0   amoxicillin-clavulanate (AUGMENTIN) 875-125 MG tablet, Take 1 tablet by mouth 2 (two) times daily., Disp: 20 tablet, Rfl: 0   CALCIUM-VITAMIN D PO, Take 1 tablet by mouth 2 (two) times daily. , Disp: , Rfl:    cetirizine (ZYRTEC) 5 MG tablet, TAKE 1 TABLET DAILY, Disp: 90 tablet, Rfl: 3   escitalopram (LEXAPRO) 10 MG tablet, TAKE 1 TABLET DAILY, Disp: 90 tablet, Rfl: 3   levothyroxine (SYNTHROID) 100 MCG tablet, Take 1 tablet (100 mcg total) by mouth daily., Disp: 90 tablet, Rfl: 3   Multiple Vitamin (MULTIVITAMIN) tablet, Take 1 tablet by mouth daily., Disp: , Rfl:    Multiple Vitamins-Minerals (ICAPS LUTEIN & ZEAXANTH,  MVI, PO), Take 1 tablet by mouth daily., Disp: , Rfl:    nadolol (CORGARD) 20 MG tablet, TAKE 1 TABLET DAILY, Disp: 90 tablet, Rfl: 3   Omega-3-acid Ethyl Esters (OMACOR PO), Take 1 tablet by mouth 2 (two) times daily. , Disp: , Rfl:    rosuvastatin (CRESTOR) 10 MG tablet, TAKE 1 TABLET DAILY, Disp: 90 tablet, Rfl: 3   Spacer/Aero-Hold Chamber Mask MISC, Spacer for inhaler, Disp: 1 each, Rfl: 0   traZODone (DESYREL) 50 MG tablet, Take 0.5-1 tablets (25-50 mg total) by mouth at bedtime as needed., Disp: 90 tablet, Rfl: 3   valACYclovir (VALTREX) 1000 MG tablet, Take two tablets ( total 2000 mg) by mouth q12h x 1 day; Start: ASAP after symptom onset, Disp: 6 tablet, Rfl: 2   vitamin B-12 (CYANOCOBALAMIN) 1000 MCG tablet, Take 1,000 mcg by mouth once a week. Takes on Mondays, Disp: , Rfl:   Allergies  Allergen Reactions   Codeine Other (See Comments)    GI upset   ROS neg/noncontributory except as noted HPI/below      Objective:     BP (!) 140/81   Pulse 66   Temp (!) 97.5 F (36.4 C) (Temporal)   Resp 18   Ht 5\' 8"  (1.727 m)   Wt  169 lb 2 oz (76.7 kg)   SpO2 92%   BMI 25.72 kg/m  Wt Readings from Last 3 Encounters:  07/18/23 169 lb 2 oz (76.7 kg)  05/30/23 166 lb 12.8 oz (75.7 kg)  10/03/22 163 lb 6.1 oz (74.1 kg)    Physical Exam   Gen: WDWN NAD HEENT: NCAT, conjunctiva not injected, sclera nonicteric TM WNL B, OP moist, no exudates  NECK:  supple, no thyromegaly, no nodes, no carotid bruits CARDIAC: RRR, S1S2+, no murmur.  LUNGS: CTAB. No wheezes EXT:  no edema MSK: no gross abnormalities.  NEURO: A&O x3.  CN II-XII intact.  PSYCH: normal mood. Good eye contact     Assessment & Plan:  Upper respiratory tract infection, unspecified type  Other orders -     Amoxicillin-Pot Clavulanate; Take 1 tablet by mouth 2 (two) times daily.  Dispense: 20 tablet; Refill: 0  URI-given pt age and lower sats, augmentin 875 bid.  Worse, ER.   Return for needs to sch annual  w/Dr. Durene Cal.  Angelena Sole, MD

## 2023-07-26 ENCOUNTER — Ambulatory Visit (INDEPENDENT_AMBULATORY_CARE_PROVIDER_SITE_OTHER): Payer: Medicare Other | Admitting: Family Medicine

## 2023-07-26 ENCOUNTER — Encounter: Payer: Self-pay | Admitting: Family Medicine

## 2023-07-26 VITALS — BP 109/74 | HR 60 | Temp 97.1°F | Resp 18 | Ht 68.0 in

## 2023-07-26 DIAGNOSIS — J40 Bronchitis, not specified as acute or chronic: Secondary | ICD-10-CM

## 2023-07-26 DIAGNOSIS — E1149 Type 2 diabetes mellitus with other diabetic neurological complication: Secondary | ICD-10-CM | POA: Diagnosis not present

## 2023-07-26 MED ORDER — PREDNISONE 20 MG PO TABS: 40.0000 mg | ORAL_TABLET | Freq: Every day | ORAL | 0 refills | Status: AC

## 2023-07-26 NOTE — Progress Notes (Signed)
 Subjective:     Patient ID: Kim Singh, female    DOB: [birth year], 83 y.o.   MRN: [id number]  Chief Complaint  Patient presents with   Cough    Still having cough, seen last week, still taking antibiotics, not feeling any better, worried about possible pneumonia    HPI Seen 1 wk ago.  On augmentin   still coughing.  Better but coughing a lot still.  Past 2 nights, better.  Just worried about pneumonia.  Prednisone  usu help but didn't get this time. No sob  no f/c  Health Maintenance Due  Topic Date Due   Diabetic kidney evaluation - Urine ACR  09/05/2022   HEMOGLOBIN A1C  12/17/2022   Diabetic kidney evaluation - eGFR measurement  06/19/2023   FOOT EXAM  06/19/2023    Past Medical History:  Diagnosis Date   Breast cancer (HCC) 2001   Left   Diabetes mellitus type 2, uncomplicated (HCC)    Personal history of chemotherapy    Personal history of radiation therapy    Pure hypercholesterolemia    Unspecified disorder of thyroid     Unspecified essential hypertension     Past Surgical History:  Procedure Laterality Date   BREAST EXCISIONAL BIOPSY Left    BREAST LUMPECTOMY Left 2001   THYROIDECTOMY     total     Current Outpatient Medications:    albuterol  (VENTOLIN  HFA) 108 (90 Base) MCG/ACT inhaler, Inhale 2 puffs into the lungs every 6 (six) hours as needed for wheezing or shortness of breath., Disp: 8 g, Rfl: 0   amoxicillin -clavulanate (AUGMENTIN ) 875-125 MG tablet, Take 1 tablet by mouth 2 (two) times daily., Disp: 20 tablet, Rfl: 0   CALCIUM -VITAMIN D PO, Take 1 tablet by mouth 2 (two) times daily. , Disp: , Rfl:    cetirizine  (ZYRTEC ) 5 MG tablet, TAKE 1 TABLET DAILY, Disp: 90 tablet, Rfl: 3   escitalopram  (LEXAPRO ) 10 MG tablet, TAKE 1 TABLET DAILY, Disp: 90 tablet, Rfl: 3   levothyroxine  (SYNTHROID ) 100 MCG tablet, Take 1 tablet (100 mcg total) by mouth daily., Disp: 90 tablet, Rfl: 3   Multiple Vitamin (MULTIVITAMIN) tablet, Take 1 tablet by mouth daily.,  Disp: , Rfl:    Multiple Vitamins-Minerals (ICAPS LUTEIN & ZEAXANTH, MVI, PO), Take 1 tablet by mouth daily., Disp: , Rfl:    nadolol  (CORGARD ) 20 MG tablet, TAKE 1 TABLET DAILY, Disp: 90 tablet, Rfl: 3   Omega-3-acid Ethyl Esters (OMACOR PO), Take 1 tablet by mouth 2 (two) times daily. , Disp: , Rfl:    predniSONE  (DELTASONE ) 20 MG tablet, Take 2 tablets (40 mg total) by mouth daily with breakfast for 5 days., Disp: 10 tablet, Rfl: 0   rosuvastatin  (CRESTOR ) 10 MG tablet, TAKE 1 TABLET DAILY, Disp: 90 tablet, Rfl: 3   Spacer/Aero-Hold Chamber Mask MISC, Spacer for inhaler, Disp: 1 each, Rfl: 0   traZODone  (DESYREL ) 50 MG tablet, Take 0.5-1 tablets (25-50 mg total) by mouth at bedtime as needed., Disp: 90 tablet, Rfl: 3   valACYclovir  (VALTREX ) 1000 MG tablet, Take two tablets ( total 2000 mg) by mouth q12h x 1 day; Start: ASAP after symptom onset, Disp: 6 tablet, Rfl: 2   vitamin B-12 (CYANOCOBALAMIN ) 1000 MCG tablet, Take 1,000 mcg by mouth once a week. Takes on Mondays, Disp: , Rfl:   Allergies  Allergen Reactions   Codeine Other (See Comments)    GI upset   ROS neg/noncontributory except as noted HPI/below      Objective:  BP 109/74   Pulse 60   Temp (!) 97.1 F (36.2 C) (Temporal)   Resp 18   Ht 5' 8 (1.727 m)   SpO2 93%   BMI 25.72 kg/m  Wt Readings from Last 3 Encounters:  07/18/23 169 lb 2 oz (76.7 kg)  05/30/23 166 lb 12.8 oz (75.7 kg)  10/03/22 163 lb 6.1 oz (74.1 kg)    Physical Exam   Gen: WDWN NAD HEENT: NCAT, conjunctiva not injected, sclera nonicteric TM WNL B, OP moist, no exudates  NECK:  supple, no thyromegaly, no nodes,  CARDIAC: RRR, S1S2+, no murmur.  LUNGS: CTAB. No wheezes EXT:  no edema MSK: no gross abnormalities.  NEURO: A&O x3.  CN II-XII intact.  PSYCH: normal mood. Good eye contact     Assessment & Plan:  Bronchitis  Type II diabetes mellitus with neurological manifestations (HCC)  Other orders -     predniSONE ; Take 2 tablets  (40 mg total) by mouth daily with breakfast for 5 days.  Dispense: 10 tablet; Refill: 0  1  bronchitis-better w/augmentin  but still coughing a lot.  [facility] gets pred.  Reviewed last A1C.  Will give pred 40mg  daily x 5 days to hold.  As some better past 2 nights, will see how she does tonight  if still a lot of coughing, then take pred 2.  DM-has been controlled.  No labs 1 yr, but has f/u in 2 wks.   Return if symptoms worsen or fail to improve.  Jenkins CHRISTELLA Carrel, MD

## 2023-07-26 NOTE — Patient Instructions (Addendum)
 Prednisone sent to the pharmacy-take if worse

## 2023-08-02 ENCOUNTER — Encounter (HOSPITAL_BASED_OUTPATIENT_CLINIC_OR_DEPARTMENT_OTHER): Payer: Self-pay | Admitting: Emergency Medicine

## 2023-08-02 ENCOUNTER — Emergency Department (HOSPITAL_BASED_OUTPATIENT_CLINIC_OR_DEPARTMENT_OTHER)
Admission: EM | Admit: 2023-08-02 | Discharge: 2023-08-02 | Disposition: A | Discharge status: Home / Self Care | Payer: Medicare Other | Attending: Emergency Medicine | Admitting: Emergency Medicine

## 2023-08-02 ENCOUNTER — Emergency Department (HOSPITAL_BASED_OUTPATIENT_CLINIC_OR_DEPARTMENT_OTHER): Payer: Medicare Other

## 2023-08-02 ENCOUNTER — Other Ambulatory Visit: Payer: Self-pay

## 2023-08-02 DIAGNOSIS — I10 Essential (primary) hypertension: Secondary | ICD-10-CM | POA: Insufficient documentation

## 2023-08-02 DIAGNOSIS — M25572 Pain in left ankle and joints of left foot: Secondary | ICD-10-CM | POA: Diagnosis not present

## 2023-08-02 DIAGNOSIS — E119 Type 2 diabetes mellitus without complications: Secondary | ICD-10-CM | POA: Insufficient documentation

## 2023-08-02 DIAGNOSIS — S93402A Sprain of unspecified ligament of left ankle, initial encounter: Secondary | ICD-10-CM | POA: Insufficient documentation

## 2023-08-02 DIAGNOSIS — Z853 Personal history of malignant neoplasm of breast: Secondary | ICD-10-CM | POA: Diagnosis not present

## 2023-08-02 DIAGNOSIS — M25571 Pain in right ankle and joints of right foot: Secondary | ICD-10-CM | POA: Diagnosis not present

## 2023-08-02 DIAGNOSIS — M7989 Other specified soft tissue disorders: Secondary | ICD-10-CM | POA: Diagnosis not present

## 2023-08-02 DIAGNOSIS — W19XXXA Unspecified fall, initial encounter: Secondary | ICD-10-CM | POA: Insufficient documentation

## 2023-08-02 DIAGNOSIS — Z79899 Other long term (current) drug therapy: Secondary | ICD-10-CM | POA: Insufficient documentation

## 2023-08-02 DIAGNOSIS — S99911A Unspecified injury of right ankle, initial encounter: Secondary | ICD-10-CM | POA: Diagnosis present

## 2023-08-02 DIAGNOSIS — S93401A Sprain of unspecified ligament of right ankle, initial encounter: Secondary | ICD-10-CM | POA: Insufficient documentation

## 2023-08-02 NOTE — ED Provider Notes (Signed)
 [place] EMERGENCY DEPARTMENT AT [facility] Provider Note   CSN: [id number] Arrival date & time: 08/02/23  1052     History  Chief Complaint  Patient presents with   [place]    Kim Singh is a 83 y.o. female.  Pt is a 83 yo female with pmhx significant for dm2, htn, hld, and breast cancer s/p lumpectomy chemo and radiation.  Pt said she tripped and fell yesterday and hurt both ankles.  The left is worse than the right.  She iced them.  She is able to walk, but it hurts.  No blood thinners.  No other trauma.       Home Medications Prior to Admission medications   Medication Sig Start Date End Date Taking? Authorizing Provider  albuterol  (VENTOLIN  HFA) 108 (90 Base) MCG/ACT inhaler Inhale 2 puffs into the lungs every 6 (six) hours as needed for wheezing or shortness of breath. 09/26/22   Job Lukes, PA  amoxicillin -clavulanate (AUGMENTIN ) 875-125 MG tablet Take 1 tablet by mouth 2 (two) times daily. 07/18/23   Wendolyn Jenkins Jansky, MD  CALCIUM -VITAMIN D PO Take 1 tablet by mouth 2 (two) times daily.     [provider]  cetirizine  (ZYRTEC ) 5 MG tablet TAKE 1 TABLET DAILY 03/07/23   Katrinka Garnette KIDD, MD  escitalopram  (LEXAPRO ) 10 MG tablet TAKE 1 TABLET DAILY 08/20/22   Katrinka Garnette KIDD, MD  levothyroxine  (SYNTHROID ) 100 MCG tablet Take 1 tablet (100 mcg total) by mouth daily. 12/10/22   Katrinka Garnette KIDD, MD  Multiple Vitamin (MULTIVITAMIN) tablet Take 1 tablet by mouth daily.    [provider]  Multiple Vitamins-Minerals (ICAPS LUTEIN & ZEAXANTH, MVI, PO) Take 1 tablet by mouth daily.    [provider]  nadolol  (CORGARD ) 20 MG tablet TAKE 1 TABLET DAILY 08/20/22   Katrinka Garnette KIDD, MD  Omega-3-acid Ethyl Esters (OMACOR PO) Take 1 tablet by mouth 2 (two) times daily.     [provider]  rosuvastatin  (CRESTOR ) 10 MG tablet TAKE 1 TABLET DAILY 09/17/22   Katrinka Garnette KIDD, MD  Spacer/Aero-Hold Chamber Mask MISC Spacer for inhaler  09/26/22   Job Lukes, PA  traZODone  (DESYREL ) 50 MG tablet Take 0.5-1 tablets (25-50 mg total) by mouth at bedtime as needed. 06/18/22   Katrinka Garnette KIDD, MD  valACYclovir  (VALTREX ) 1000 MG tablet Take two tablets ( total 2000 mg) by mouth q12h x 1 day; Start: ASAP after symptom onset 10/03/22   Job Lukes, PA  vitamin B-12 (CYANOCOBALAMIN ) 1000 MCG tablet Take 1,000 mcg by mouth once a week. Takes on Mondays    [provider]      Allergies    Codeine    Review of Systems   Review of Systems  Musculoskeletal:        Bilateral ankle pain  All other systems reviewed and are negative.   Physical Exam Updated Vital Signs BP (!) 140/83   Pulse 65   Temp 98.6 F (37 C)   Resp 16   Wt 70 kg   SpO2 97%   BMI 23.46 kg/m  Physical Exam Vitals and nursing note reviewed.  Constitutional:      Appearance: Normal appearance.  HENT:     Head: Normocephalic and atraumatic.     Right Ear: External ear normal.     Left Ear: External ear normal.     Nose: Nose normal.     Mouth/Throat:     Mouth: Mucous membranes are moist.  Pharynx: Oropharynx is clear.  Eyes:     Extraocular Movements: Extraocular movements intact.     Conjunctiva/sclera: Conjunctivae normal.     Pupils: Pupils are equal, round, and reactive to light.  Cardiovascular:     Rate and Rhythm: Normal rate and regular rhythm.     Pulses: Normal pulses.     Heart sounds: Normal heart sounds.  Pulmonary:     Effort: Pulmonary effort is normal.     Breath sounds: Normal breath sounds.  Abdominal:     General: Abdomen is flat. Bowel sounds are normal.     Palpations: Abdomen is soft.  Musculoskeletal:     Cervical back: Normal range of motion and neck supple.     Comments: Bilateral ankle tenderness and swelling (left worse than right)  Skin:    General: Skin is warm.     Capillary Refill: Capillary refill takes less than 2 seconds.  Neurological:     General: No focal deficit present.      Mental Status: She is alert and oriented to person, place, and time.  Psychiatric:        Mood and Affect: Mood normal.        Behavior: Behavior normal.     ED Results / Procedures / Treatments   Labs (all labs ordered are listed, but only abnormal results are displayed) Labs Reviewed - No data to display  EKG None  Radiology DG Ankle Complete Left Result Date: 08/02/2023 CLINICAL DATA:  Fall, pain EXAM: LEFT ANKLE COMPLETE - 3+ VIEW; RIGHT ANKLE - COMPLETE 3+ VIEW COMPARISON:  None Available. FINDINGS: There is no evidence of fracture, dislocation, or joint effusion. There is no evidence of arthropathy or other focal bone abnormality. Soft tissue edema about the left ankle, particularly of the lateral malleolus. IMPRESSION: 1. No fracture or dislocation of the bilateral ankles. 2. Soft tissue edema about the left ankle, particularly of the lateral malleolus. Electronically Signed   By: Marolyn JONETTA Jaksch M.D.   On: 08/02/2023 11:38   DG Ankle Complete Right Result Date: 08/02/2023 CLINICAL DATA:  Fall, pain EXAM: LEFT ANKLE COMPLETE - 3+ VIEW; RIGHT ANKLE - COMPLETE 3+ VIEW COMPARISON:  None Available. FINDINGS: There is no evidence of fracture, dislocation, or joint effusion. There is no evidence of arthropathy or other focal bone abnormality. Soft tissue edema about the left ankle, particularly of the lateral malleolus. IMPRESSION: 1. No fracture or dislocation of the bilateral ankles. 2. Soft tissue edema about the left ankle, particularly of the lateral malleolus. Electronically Signed   By: Marolyn JONETTA Jaksch M.D.   On: 08/02/2023 11:38    Procedures Procedures    Medications Ordered in ED Medications - No data to display  ED Course/ Medical Decision Making/ A&P                                 Medical Decision Making Amount and/or Complexity of Data Reviewed Radiology: ordered.   This patient presents to the ED for concern of ankle pain, this involves an extensive number of  treatment options, and is a complaint that carries with it a high risk of complications and morbidity.  The differential diagnosis includes fx, sprain   Co morbidities that complicate the patient evaluation  dm2, htn, hld, and breast cancer s/p lumpectomy chemo and radiation   Additional history obtained:  Additional history obtained from epic chart review External records from outside source obtained  and reviewed including family  Imaging Studies ordered:  I ordered imaging studies including left and right ankle  I independently visualized and interpreted imaging which showed  B ankles:  No fracture or dislocation of the bilateral ankles.  2. Soft tissue edema about the left ankle, particularly of the  lateral malleolus.   I agree with the radiologist interpretation  Medicines ordered and prescription drug management:  I have reviewed the patients home medicines and have made adjustments as needed   Problem List / ED Course:  B ankle sprain:  pt placed in ASO splint bilaterally.  She is able to ambulate.  She is to f/u with ortho.  Return if worse.     Reevaluation:  After the interventions noted above, I reevaluated the patient and found that they have :improved   Social Determinants of Health:  Lives at home   Dispostion:  After consideration of the diagnostic results and the patients response to treatment, I feel that the patent would benefit from discharge with outpatient f/u.          Final Clinical Impression(s) / ED Diagnoses Final diagnoses:  Sprain of left ankle, unspecified ligament, initial encounter  Sprain of right ankle, unspecified ligament, initial encounter    Rx / DC Orders ED Discharge Orders     None         Dean Clarity, MD 08/02/23 1151

## 2023-08-02 NOTE — ED Triage Notes (Signed)
 Pt c/o mechanical fall yesterday morning. Pt endorses Bilateral ankle pain, swelling noted to LT ankle. Denies thinners, denies head injury

## 2023-08-02 NOTE — ED Notes (Signed)
 ED Provider at bedside.

## 2023-08-08 ENCOUNTER — Telehealth: Payer: Self-pay

## 2023-08-08 NOTE — Progress Notes (Signed)
Transition Care Management Unsuccessful Follow-up Telephone Call  Date of discharge and from where:  08/02/2023 [facility]  Attempts:  1st Attempt  Reason for unsuccessful TCM follow-up call:  Left voice message  Kim Singh [facility]  [facility] Guide Direct Dial: [phone]  Fax: [phone] Website: [email or web address]

## 2023-08-09 ENCOUNTER — Telehealth: Payer: Self-pay

## 2023-08-09 NOTE — Progress Notes (Signed)
Transition Care Management Unsuccessful Follow-up Telephone Call  Date of discharge and from where:  08/02/2023 [facility]  Attempts:  2nd Attempt  Reason for unsuccessful TCM follow-up call:  Left voice message  Kim Singh [facility]  [facility] Guide Direct Dial: [phone]  Fax: [phone] Website: [email or web address]

## 2023-08-13 ENCOUNTER — Encounter: Payer: Self-pay | Admitting: Family Medicine

## 2023-08-13 ENCOUNTER — Ambulatory Visit (INDEPENDENT_AMBULATORY_CARE_PROVIDER_SITE_OTHER): Payer: Medicare Other | Admitting: Family Medicine

## 2023-08-13 VITALS — BP 112/80 | HR 84 | Temp 97.0°F | Ht 68.0 in | Wt 164.4 lb

## 2023-08-13 DIAGNOSIS — Z7984 Long term (current) use of oral hypoglycemic drugs: Secondary | ICD-10-CM | POA: Diagnosis not present

## 2023-08-13 DIAGNOSIS — J439 Emphysema, unspecified: Secondary | ICD-10-CM

## 2023-08-13 DIAGNOSIS — E1149 Type 2 diabetes mellitus with other diabetic neurological complication: Secondary | ICD-10-CM | POA: Diagnosis not present

## 2023-08-13 DIAGNOSIS — R079 Chest pain, unspecified: Secondary | ICD-10-CM | POA: Diagnosis not present

## 2023-08-13 DIAGNOSIS — R918 Other nonspecific abnormal finding of lung field: Secondary | ICD-10-CM

## 2023-08-13 DIAGNOSIS — I7121 Aneurysm of the ascending aorta, without rupture: Secondary | ICD-10-CM | POA: Diagnosis not present

## 2023-08-13 DIAGNOSIS — E039 Hypothyroidism, unspecified: Secondary | ICD-10-CM | POA: Diagnosis not present

## 2023-08-13 MED ORDER — ESCITALOPRAM OXALATE 10 MG PO TABS: 10.0000 mg | ORAL_TABLET | Freq: Every day | ORAL | 3 refills | Status: DC

## 2023-08-13 NOTE — Patient Instructions (Addendum)
strongly encouraged cane and letting me know if still having falls  We will call you within two weeks about your referral for CT angiogram through [facility].  Their phone number is [phone].  Please call them if you have not heard in 1-2 weeks  Could try claritin or zyrtec or allegra (don't need name brand) for runny nose to see if this helps- give 2 week trial  Definitely support you looking at seeing therapist- sounds like you have a good potential fit- let me know if they need refarral   Call cardiology if you don't hear within a weke- seek care with new or worsening symptoms Please stop by lab before you go If you have mychart- we will send your results within 3 business days of [place] receiving them.  If you do not have mychart- we will call you about results within 5 business days of [place] receiving them.  *please also note that you will see labs on mychart as soon as they post. I will later go in and write notes on them- will say "notes from Dr. Durene Cal"   Recommended follow up: Return in about 6 months (around 02/10/2024) for followup or sooner if needed.Schedule b4 you leave.

## 2023-08-13 NOTE — Progress Notes (Signed)
Phone [phone] In person visit   Subjective:   Kim Singh is a 83 y.o. year old very pleasant female patient who presents for/with See problem oriented charting Chief Complaint  Patient presents with   Annual Exam    Not fasting.    Hypothyroidism   Diabetes   Past Medical History-  Patient Active Problem List   Diagnosis Date Noted   Ascending aortic aneurysm (HCC) 10/09/2021    Priority: High   Type II diabetes mellitus with neurological manifestations (HCC) 11/29/2013    Priority: High   Aortic atherosclerosis (HCC) 10/09/2021    Priority: Medium    Emphysema lung (HCC) 10/09/2021    Priority: Medium    Diabetic neuropathy (HCC) 08/22/2015    Priority: Medium    Depression 02/28/2015    Priority: Medium    Dyslipidemia 11/29/2013    Priority: Medium    Hypothyroidism 11/29/2013    Priority: Medium    Essential hypertension 11/29/2013    Priority: Medium    Insomnia 12/19/2018    Priority: Low   Former smoker 02/28/2015    Priority: Low   History of breast cancer 11/30/2013    Priority: Low   History of thyroid cancer 11/30/2013    Priority: Low   Orthostatic hypotension 11/29/2013    Priority: Low   Grief reaction 05/16/2017    Medications- reviewed and updated Current Outpatient Medications  Medication Sig Dispense Refill   albuterol (VENTOLIN HFA) 108 (90 Base) MCG/ACT inhaler Inhale 2 puffs into the lungs every 6 (six) hours as needed for wheezing or shortness of breath. 8 g 0   CALCIUM-VITAMIN D PO Take 1 tablet by mouth 2 (two) times daily.      cetirizine (ZYRTEC) 5 MG tablet TAKE 1 TABLET DAILY 90 tablet 3   levothyroxine (SYNTHROID) 100 MCG tablet Take 1 tablet (100 mcg total) by mouth daily. 90 tablet 3   Multiple Vitamin (MULTIVITAMIN) tablet Take 1 tablet by mouth daily.     Multiple Vitamins-Minerals (ICAPS LUTEIN & ZEAXANTH, MVI, PO) Take 1 tablet by mouth daily.     nadolol (CORGARD) 20 MG tablet TAKE 1 TABLET DAILY 90 tablet 3    Omega-3-acid Ethyl Esters (OMACOR PO) Take 1 tablet by mouth 2 (two) times daily.      rosuvastatin (CRESTOR) 10 MG tablet TAKE 1 TABLET DAILY 90 tablet 3   Spacer/Aero-Hold Chamber Mask MISC Spacer for inhaler 1 each 0   valACYclovir (VALTREX) 1000 MG tablet Take two tablets ( total 2000 mg) by mouth q12h x 1 day; Start: ASAP after symptom onset 6 tablet 2   vitamin B-12 (CYANOCOBALAMIN) 1000 MCG tablet Take 1,000 mcg by mouth once a week. Takes on Mondays     escitalopram (LEXAPRO) 10 MG tablet Take 1 tablet (10 mg total) by mouth daily. 90 tablet 3   No current facility-administered medications for this visit.     Objective:  BP 112/80   Pulse 84   Temp (!) 97 F (36.1 C)   Ht 5\' 8"  (1.727 m)   Wt 164 lb 6.4 oz (74.6 kg)   SpO2 95%   BMI 25.00 kg/m  Gen: NAD, resting comfortably CV: RRR no murmurs rubs or gallops Lungs: CTAB no crackles, wheeze, rhonchi Ext: no edema Skin: warm, dry Neuro: grossly normal, moves all extremities  Diabetic Foot Exam - Simple   Simple Foot Form Diabetic Foot exam was performed with the following findings: Yes 08/13/2023  3:40 PM  Visual Inspection No deformities, no  ulcerations, no other skin breakdown bilaterally: Yes Sensation Testing Intact to touch and monofilament testing bilaterally: Yes Pulse Check Posterior Tibialis and Dorsalis pulse intact bilaterally: Yes Comments        Assessment and Plan   # intermittent non exertional chest pain S:sometimes in the morning wakes up with central chest discomfort. Symptoms for 5 minutes perhaps then goes away. Never occurs with exertion. Can occur anytime of the day. Not shortness of breath with the episode. No lightheadedness with it.  - not sure if associated with food.  - once every week or two weeks  -about 4 years of symptoms -reviewed EKG 10/02/21 - low risk but low voltage and left axis A/P: with her age and diabetes - we don't want to ignore this - refer to cardiology though  discussed do not think high risk to be cardiac  #Falls- has had 3 falls in last 6-8 months- discussed prevention strategies and strongly encouraged cane and letting me know if still having falls -recent fall and seen in Emergency Department for ankle sprain- ankle is improving- she is not wearing brace and wants to hold off on orthopedics -can flare aches and pains  #Diabetes with neuropathy.  S: Most recently diet controlled -in the past on Metformin 1000 mg daily -Not on medicine for neuropathy- doesn't want to take anything still Lab Results  Component Value Date   HGBA1C 6.6 (H) 06/18/2022   HGBA1C 6.5 01/05/2022   HGBA1C 6.6 (H) 09/05/2021  A/P: hopefully stable- update a1c today. Continue without meds for now   #Hypertension S: controlled on nadolol 20 mg - in the past on quinapril 5 mg (found to have a fungal lip issue and was not angioedema) A/P: stable- continue current medicines     Depression  s: Compliant with Lexapro 10 mg.  Loss of son to suicide in October 2018 contributes. -trazodone for sleep     08/13/2023    2:37 PM 05/30/2023    2:06 PM 09/26/2022    1:30 PM  Depression screen PHQ 2/9  Decreased Interest 0 0 0  Down, Depressed, Hopeless 0 1 0  PHQ - 2 Score 0 1 0  Altered sleeping 1  0  Tired, decreased energy 1  0  Change in appetite 0  0  Feeling bad or failure about yourself  0  0  Trouble concentrating 0  0  Moving slowly or fidgety/restless 0  0  Suicidal thoughts 0  0  PHQ-9 Score 2  0  Difficult doing work/chores Not difficult at all  Not difficult at all  A/P:  depression well controlled but still some grieving from loss of son- may see therapist daughter suggests.    #Hyperlipidemia #Aortic atherosclerosis S: medication:  Crestor 10 mg daily. Fish oil with triglycerides -ldl was 23 when also on zetia Lab Results  Component Value Date   CHOL 191 06/18/2022   HDL 44.40 06/18/2022   LDLCALC 23 03/06/2021   LDLDIRECT 107.0 06/18/2022   TRIG  341.0 (H) 06/18/2022   CHOLHDL 4 06/18/2022  A/P: lipids at goal- continue current medications and update lipids   #Hypothyroidism S: Compliant with levothyroxine 100 mcg Lab Results  Component Value Date   TSH 0.40 08/02/2022  A/P: hopefully stable- update tsh today. Continue current meds for now    #Asymptomatic emphysema in past-incidental finding on CT imaging- perhaps very mild shortness of breath at times but doesn't go along with chest pain- will monitor . Will monitor breathing but reports  primarily mild issues with activity but no chest pain    Incidental finding of ascending aortic aneurysm at 4 cm with recommendation for annual follow-up by CTA or MR. noted A march 2023- repeat 2024 planned but we have not had recent visit- will order today   #Cold intolerance- reports always cold- will check CBC and TSH   #nose dripping - doesn't tolerate Flonase- doesn't use long enough to know if it helps. Could try claritin or zyrtec  Recommended follow up: Return in about 6 months (around 02/10/2024) for followup or sooner if needed.Schedule b4 you leave. Future Appointments  Date Time Provider Department Center  06/04/2024  1:30 PM [facility] ANNUAL WELLNESS VISIT 1 [facility]    Lab/Order associations: bagel this morning   ICD-10-CM   1. Intermittent chest pain  R07.9 Ambulatory referral to Cardiology    2. Type II diabetes mellitus with neurological manifestations (HCC)  E11.49 Urine Microalbumin w/creat. ratio    Hemoglobin A1c    CBC with Differential/Platelet    Comprehensive metabolic panel    Lipid panel    3. Hypothyroidism, unspecified type  E03.9 TSH    4. Aneurysm of ascending aorta without rupture (HCC)  I71.21 CT ANGIO CHEST AORTA W/CM & OR WO/CM    5. Pulmonary emphysema, unspecified emphysema type (HCC) Chronic J43.9       Meds ordered this encounter  Medications   escitalopram (LEXAPRO) 10 MG tablet    Sig: Take 1 tablet (10 mg total) by mouth daily.     Dispense:  90 tablet    Refill:  3    Return precautions advised.  Tana Conch, MD

## 2023-08-13 NOTE — Addendum Note (Signed)
Addended by: Lorn Junes on: 08/13/2023 04:01 PM   Modules accepted: Orders

## 2023-08-14 ENCOUNTER — Other Ambulatory Visit: Payer: Medicare Other

## 2023-08-14 LAB — MICROALBUMIN / CREATININE URINE RATIO
Creatinine,U: 88.2 mg/dL
Microalb Creat Ratio: 3.5 mg/g (ref 0.0–30.0)
Microalb, Ur: 3.1 mg/dL — ABNORMAL HIGH (ref 0.0–1.9)

## 2023-08-15 ENCOUNTER — Other Ambulatory Visit: Payer: Self-pay | Admitting: Family Medicine

## 2023-08-15 ENCOUNTER — Encounter: Payer: Self-pay | Admitting: Family Medicine

## 2023-08-19 ENCOUNTER — Other Ambulatory Visit (INDEPENDENT_AMBULATORY_CARE_PROVIDER_SITE_OTHER): Payer: Medicare Other

## 2023-08-19 ENCOUNTER — Encounter: Payer: Self-pay | Admitting: Family Medicine

## 2023-08-19 DIAGNOSIS — E039 Hypothyroidism, unspecified: Secondary | ICD-10-CM | POA: Diagnosis not present

## 2023-08-19 DIAGNOSIS — E1149 Type 2 diabetes mellitus with other diabetic neurological complication: Secondary | ICD-10-CM | POA: Diagnosis not present

## 2023-08-19 LAB — CBC WITH DIFFERENTIAL/PLATELET
Basophils Absolute: 0 10*3/uL (ref 0.0–0.1)
Basophils Relative: 0.6 % (ref 0.0–3.0)
Eosinophils Absolute: 0.2 10*3/uL (ref 0.0–0.7)
Eosinophils Relative: 3.8 % (ref 0.0–5.0)
HCT: 42.1 % (ref 36.0–46.0)
Hemoglobin: 14.1 g/dL (ref 12.0–15.0)
Lymphocytes Relative: 17 % (ref 12.0–46.0)
Lymphs Abs: 1 10*3/uL (ref 0.7–4.0)
MCHC: 33.5 g/dL (ref 30.0–36.0)
MCV: 97 fL (ref 78.0–100.0)
Monocytes Absolute: 0.8 10*3/uL (ref 0.1–1.0)
Monocytes Relative: 13.2 % — ABNORMAL HIGH (ref 3.0–12.0)
Neutro Abs: 4 10*3/uL (ref 1.4–7.7)
Neutrophils Relative %: 65.4 % (ref 43.0–77.0)
Platelets: 196 10*3/uL (ref 150.0–400.0)
RBC: 4.34 Mil/uL (ref 3.87–5.11)
RDW: 12.8 % (ref 11.5–15.5)
WBC: 6.1 10*3/uL (ref 4.0–10.5)

## 2023-08-19 LAB — HEMOGLOBIN A1C: Hgb A1c MFr Bld: 6.9 % — ABNORMAL HIGH (ref 4.6–6.5)

## 2023-08-19 LAB — COMPREHENSIVE METABOLIC PANEL
ALT: 20 U/L (ref 0–35)
AST: 25 U/L (ref 0–37)
Albumin: 4.1 g/dL (ref 3.5–5.2)
Alkaline Phosphatase: 71 U/L (ref 39–117)
BUN: 12 mg/dL (ref 6–23)
CO2: 33 meq/L — ABNORMAL HIGH (ref 19–32)
Calcium: 9.5 mg/dL (ref 8.4–10.5)
Chloride: 98 meq/L (ref 96–112)
Creatinine, Ser: 0.73 mg/dL (ref 0.40–1.20)
GFR: 75.33 mL/min (ref >60.00)
Glucose, Bld: 151 mg/dL — ABNORMAL HIGH (ref 70–99)
Potassium: 4.3 meq/L (ref 3.5–5.1)
Sodium: 139 meq/L (ref 135–145)
Total Bilirubin: 1 mg/dL (ref 0.2–1.2)
Total Protein: 6.6 g/dL (ref 6.0–8.3)

## 2023-08-19 LAB — LIPID PANEL
Cholesterol: 165 mg/dL (ref 0–200)
HDL: 43 mg/dL (ref >39.00)
LDL Cholesterol: 78 mg/dL (ref 0–99)
NonHDL: 121.52
Total CHOL/HDL Ratio: 4
Triglycerides: 217 mg/dL — ABNORMAL HIGH (ref 0.0–149.0)
VLDL: 43.4 mg/dL — ABNORMAL HIGH (ref 0.0–40.0)

## 2023-08-19 LAB — TSH: TSH: 2.85 u[IU]/mL (ref 0.35–5.50)

## 2023-08-22 ENCOUNTER — Ambulatory Visit
Admission: RE | Admit: 2023-08-22 | Discharge: 2023-08-22 | Disposition: A | Discharge status: Home / Self Care | Payer: Medicare Other | Source: Ambulatory Visit | Attending: Family Medicine | Admitting: Family Medicine

## 2023-08-22 DIAGNOSIS — I7121 Aneurysm of the ascending aorta, without rupture: Secondary | ICD-10-CM | POA: Diagnosis not present

## 2023-08-22 MED ORDER — IOPAMIDOL (ISOVUE-370) INJECTION 76%
75.0000 mL | Freq: Once | INTRAVENOUS | Status: AC | PRN
  Administered 2023-08-22: 75 mL via INTRAVENOUS

## 2023-08-26 ENCOUNTER — Encounter: Payer: Self-pay | Admitting: Family Medicine

## 2023-08-26 NOTE — Addendum Note (Signed)
Addended by: Shelva Majestic on: 08/26/2023 05:33 PM   Modules accepted: Orders

## 2023-09-11 ENCOUNTER — Other Ambulatory Visit: Payer: Self-pay | Admitting: Family Medicine

## 2023-09-12 ENCOUNTER — Encounter (HOSPITAL_COMMUNITY): Payer: Medicare Other

## 2023-09-13 ENCOUNTER — Encounter (HOSPITAL_COMMUNITY)
Admission: RE | Admit: 2023-09-13 | Discharge: 2023-09-13 | Disposition: A | Discharge status: Home / Self Care | Payer: Medicare Other | Source: Ambulatory Visit | Attending: Family Medicine | Admitting: Family Medicine

## 2023-09-13 DIAGNOSIS — C73 Malignant neoplasm of thyroid gland: Secondary | ICD-10-CM | POA: Diagnosis not present

## 2023-09-13 DIAGNOSIS — R918 Other nonspecific abnormal finding of lung field: Secondary | ICD-10-CM | POA: Insufficient documentation

## 2023-09-13 LAB — GLUCOSE, CAPILLARY: Glucose-Capillary: 131 mg/dL — ABNORMAL HIGH (ref 70–99)

## 2023-09-13 MED ORDER — FLUDEOXYGLUCOSE F - 18 (FDG) INJECTION
8.1400 | Freq: Once | INTRAVENOUS | Status: AC | PRN
  Administered 2023-09-13: 8.14 via INTRAVENOUS

## 2023-09-17 ENCOUNTER — Ambulatory Visit: Payer: Self-pay | Admitting: Family Medicine

## 2023-09-17 NOTE — Telephone Encounter (Signed)
 I accepted the call from agent beause the daughter was calling back in from a missed nurse call.   She actually got the call on the other line so I ended my call with the daughter.    No triage done since other nurse on the other line.

## 2023-09-17 NOTE — Telephone Encounter (Signed)
 Copied from CRM [id number]. Topic: Clinical - Red Word Triage >> Sep 17, 2023  3:25 PM Deaijah H wrote: Red Word that prompted transfer to Nurse Triage: Patients daughter calling in to return triage nurse callback and for appointment

## 2023-09-17 NOTE — Telephone Encounter (Signed)
 Please schedule pt for ov to be evaluated with available provider.

## 2023-09-17 NOTE — Telephone Encounter (Addendum)
 This RN third attempt to contact patient, received busy signal. Forwarding to office for review.  This RN attempted to contact patient for triage, received busy signal x2. Placing for callback.

## 2023-09-17 NOTE — Telephone Encounter (Signed)
 2nd attempt to call patient-call didn't go through. Will place back in call backs. Copied From CRM [id number]. Reason for Triage: Cold like symptoms: coughing, body aches, headaches.   Patient is not sure if she needs an appointment or go to the ER.

## 2023-09-18 ENCOUNTER — Emergency Department (HOSPITAL_COMMUNITY)
Admission: EM | Admit: 2023-09-18 | Discharge: 2023-09-18 | Discharge status: Against Medical Advice | Payer: Medicare Other | Attending: Emergency Medicine | Admitting: Emergency Medicine

## 2023-09-18 ENCOUNTER — Ambulatory Visit: Payer: Medicare Other | Admitting: Family Medicine

## 2023-09-18 ENCOUNTER — Emergency Department (HOSPITAL_COMMUNITY): Payer: Medicare Other

## 2023-09-18 ENCOUNTER — Encounter (HOSPITAL_COMMUNITY): Payer: Self-pay | Admitting: *Deleted

## 2023-09-18 ENCOUNTER — Other Ambulatory Visit: Payer: Self-pay

## 2023-09-18 DIAGNOSIS — J101 Influenza due to other identified influenza virus with other respiratory manifestations: Secondary | ICD-10-CM | POA: Diagnosis not present

## 2023-09-18 DIAGNOSIS — Z5321 Procedure and treatment not carried out due to patient leaving prior to being seen by health care provider: Secondary | ICD-10-CM | POA: Insufficient documentation

## 2023-09-18 DIAGNOSIS — R918 Other nonspecific abnormal finding of lung field: Secondary | ICD-10-CM | POA: Diagnosis not present

## 2023-09-18 DIAGNOSIS — I700 Atherosclerosis of aorta: Secondary | ICD-10-CM | POA: Diagnosis not present

## 2023-09-18 DIAGNOSIS — I10 Essential (primary) hypertension: Secondary | ICD-10-CM | POA: Diagnosis not present

## 2023-09-18 DIAGNOSIS — R059 Cough, unspecified: Secondary | ICD-10-CM | POA: Diagnosis not present

## 2023-09-18 LAB — BASIC METABOLIC PANEL
Anion gap: 13 (ref 5–15)
BUN: 26 mg/dL — ABNORMAL HIGH (ref 8–23)
CO2: 29 mmol/L (ref 22–32)
Calcium: 9.2 mg/dL (ref 8.9–10.3)
Chloride: 95 mmol/L — ABNORMAL LOW (ref 98–111)
Creatinine, Ser: 0.92 mg/dL (ref 0.44–1.00)
GFR, Estimated: >60 mL/min (ref >60)
Glucose, Bld: 144 mg/dL — ABNORMAL HIGH (ref 70–99)
Potassium: 3.3 mmol/L — ABNORMAL LOW (ref 3.5–5.1)
Sodium: 137 mmol/L (ref 135–145)

## 2023-09-18 LAB — CBC WITH DIFFERENTIAL/PLATELET
Abs Immature Granulocytes: 0.02 10*3/uL (ref 0.00–0.07)
Basophils Absolute: 0 10*3/uL (ref 0.0–0.1)
Basophils Relative: 0 %
Eosinophils Absolute: 0 10*3/uL (ref 0.0–0.5)
Eosinophils Relative: 0 %
HCT: 42.8 % (ref 36.0–46.0)
Hemoglobin: 14.2 g/dL (ref 12.0–15.0)
Immature Granulocytes: 0 %
Lymphocytes Relative: 17 %
Lymphs Abs: 1.3 10*3/uL (ref 0.7–4.0)
MCH: 31.9 pg (ref 26.0–34.0)
MCHC: 33.2 g/dL (ref 30.0–36.0)
MCV: 96.2 fL (ref 80.0–100.0)
Monocytes Absolute: 1.3 10*3/uL — ABNORMAL HIGH (ref 0.1–1.0)
Monocytes Relative: 16 %
Neutro Abs: 5.4 10*3/uL (ref 1.7–7.7)
Neutrophils Relative %: 67 %
Platelets: 147 10*3/uL — ABNORMAL LOW (ref 150–400)
RBC: 4.45 MIL/uL (ref 3.87–5.11)
RDW: 13.2 % (ref 11.5–15.5)
WBC: 8 10*3/uL (ref 4.0–10.5)
nRBC: 0 % (ref 0.0–0.2)

## 2023-09-18 LAB — RESP PANEL BY RT-PCR (RSV, FLU A&B, COVID)  RVPGX2
Influenza A by PCR: POSITIVE — AB
Influenza B by PCR: NEGATIVE
Resp Syncytial Virus by PCR: NEGATIVE
SARS Coronavirus 2 by RT PCR: NEGATIVE

## 2023-09-18 NOTE — ED Triage Notes (Signed)
 The pt is c/o a cough since Sunday  chills no known temp

## 2023-09-18 NOTE — ED Provider Triage Note (Signed)
 Emergency Medicine Provider Triage Evaluation Note  Kim Singh , a 83 y.o. female  was evaluated in triage.  Pt complains of cough.  Symptoms onset on Monday, persistent since then.  No known sick contacts.  Try to get into see her primary doctor but they did not have an appointment and so she came here for evaluation.  States she has had pneumonia in the past 1 to make sure this is not that.  No known fevers.  No chest pain or shortness of breath.  Review of Systems  Positive:  Negative:   Physical Exam  BP 114/63   Pulse 72   Temp 98.1 F (36.7 C)   Resp 18   Ht 5\' 8"  (1.727 m)   Wt 74.6 kg   SpO2 94%   BMI 25.01 kg/m  Gen:   Awake, no distress   Resp:  Normal effort  MSK:   Moves extremities without difficulty  Other:    Medical Decision Making  Medically screening exam initiated at 6:07 PM.  Appropriate orders placed.  Keyandra Swenson was informed that the remainder of the evaluation will be completed by another provider, this initial triage assessment does not replace that evaluation, and the importance of remaining in the ED until their evaluation is complete.     Silva Bandy, PA-C 09/18/23 [phone]

## 2023-09-18 NOTE — ED Notes (Signed)
 Patient advised sort RN that they are leaving .

## 2023-09-19 ENCOUNTER — Ambulatory Visit (INDEPENDENT_AMBULATORY_CARE_PROVIDER_SITE_OTHER): Payer: Medicare Other | Admitting: Family Medicine

## 2023-09-19 ENCOUNTER — Ambulatory Visit: Payer: Medicare Other | Admitting: Family Medicine

## 2023-09-19 ENCOUNTER — Encounter: Payer: Self-pay | Admitting: Family Medicine

## 2023-09-19 VITALS — BP 110/75 | HR 63 | Temp 97.7°F | Resp 18 | Ht 68.0 in | Wt 163.0 lb

## 2023-09-19 DIAGNOSIS — J101 Influenza due to other identified influenza virus with other respiratory manifestations: Secondary | ICD-10-CM | POA: Diagnosis not present

## 2023-09-19 DIAGNOSIS — R918 Other nonspecific abnormal finding of lung field: Secondary | ICD-10-CM

## 2023-09-19 MED ORDER — OSELTAMIVIR PHOSPHATE 75 MG PO CAPS
75.0000 mg | ORAL_CAPSULE | Freq: Two times a day (BID) | ORAL | 0 refills | Status: DC
Start: 2023-09-19 — End: 2023-10-07

## 2023-09-19 MED ORDER — ALBUTEROL SULFATE HFA 108 (90 BASE) MCG/ACT IN AERS: 2.0000 | INHALATION_SPRAY | Freq: Four times a day (QID) | RESPIRATORY_TRACT | 0 refills | Status: DC | PRN

## 2023-09-19 MED ORDER — BENZONATATE 100 MG PO CAPS
100.0000 mg | ORAL_CAPSULE | Freq: Three times a day (TID) | ORAL | 0 refills | Status: DC | PRN
Start: 2023-09-19 — End: 2023-10-08

## 2023-09-19 NOTE — Patient Instructions (Signed)
 You have flu  Meds sent to pharmacy  Worse, etc, ER

## 2023-09-19 NOTE — Progress Notes (Unsigned)
 Subjective:     Patient ID: Kim Singh, female    DOB: 1940/09/17, 83 y.o.   MRN: [id number]  Chief Complaint  Patient presents with   Cough    Non productive cough that started last week Went to ER yesterday, got tired of waiting, left around 9 pm   Fatigue    HPI Discussed the use of AI scribe software for clinical note transcription with the patient, who gave verbal consent to proceed.  History of Present Illness   Shayleigh Bouldin is an 83 year old female who presents with flu symptoms. She is accompanied by her daughter, Linton Rump, who is a Runner, broadcasting/film/video.  She has been experiencing flu-like symptoms since Monday, including a persistent cough and significant fatigue, which have left her mostly bedridden. She attempted to make appointments earlier in the week but was unable to attend due to her condition. She visited the ER on [address] with her daughter, Amy, where tests were conducted, but she left before receiving a full evaluation. She has no fever, vomiting, or diarrhea. She suspects she contracted the flu after attending a volleyball game at her grandson's college, where she hugged several team members who had been ill. She recalls having a flu shot this year but still contracted the flu.  She has been experiencing shortness of breath, which has made it difficult for her to perform daily activities such as preparing food. She has not been using an albuterol inhaler recently.  Her past medical history includes breast cancer diagnosed in 2001. She mentions a previous CT scan for thoracic aortic disease, which showed a dilated aorta but no aneurysm. A recent chest x-ray revealed a persistent nodule in her lung and mild emphysema. She is awaiting results from a PET scan to further evaluate this finding.  She lives with her daughter and grandson, and her son-in-law recently moved out.       There are no preventive care reminders to display for this patient.  Past Medical History:   Diagnosis Date   Breast cancer (HCC) 2001   Left   Diabetes mellitus type 2, uncomplicated (HCC)    Personal history of chemotherapy    Personal history of radiation therapy    Pure hypercholesterolemia    Unspecified disorder of thyroid    Unspecified essential hypertension     Past Surgical History:  Procedure Laterality Date   BREAST EXCISIONAL BIOPSY Left    BREAST LUMPECTOMY Left 2001   THYROIDECTOMY     total     Current Outpatient Medications:    benzonatate (TESSALON PERLES) 100 MG capsule, Take 1 capsule (100 mg total) by mouth 3 (three) times daily as needed., Disp: 20 capsule, Rfl: 0   CALCIUM-VITAMIN D PO, Take 1 tablet by mouth 2 (two) times daily. , Disp: , Rfl:    cetirizine (ZYRTEC) 5 MG tablet, TAKE 1 TABLET DAILY, Disp: 90 tablet, Rfl: 3   escitalopram (LEXAPRO) 10 MG tablet, Take 1 tablet (10 mg total) by mouth daily., Disp: 90 tablet, Rfl: 3   levothyroxine (SYNTHROID) 100 MCG tablet, Take 1 tablet (100 mcg total) by mouth daily., Disp: 90 tablet, Rfl: 3   Multiple Vitamin (MULTIVITAMIN) tablet, Take 1 tablet by mouth daily., Disp: , Rfl:    Multiple Vitamins-Minerals (ICAPS LUTEIN & ZEAXANTH, MVI, PO), Take 1 tablet by mouth daily., Disp: , Rfl:    nadolol (CORGARD) 20 MG tablet, TAKE 1 TABLET DAILY, Disp: 90 tablet, Rfl: 3   Omega-3-acid Ethyl Esters (OMACOR PO),  Take 1 tablet by mouth 2 (two) times daily. , Disp: , Rfl:    oseltamivir (TAMIFLU) 75 MG capsule, Take 1 capsule (75 mg total) by mouth 2 (two) times daily., Disp: 10 capsule, Rfl: 0   rosuvastatin (CRESTOR) 10 MG tablet, TAKE 1 TABLET DAILY, Disp: 90 tablet, Rfl: 3   Spacer/Aero-Hold Chamber Mask MISC, Spacer for inhaler, Disp: 1 each, Rfl: 0   valACYclovir (VALTREX) 1000 MG tablet, Take two tablets ( total 2000 mg) by mouth q12h x 1 day; Start: ASAP after symptom onset, Disp: 6 tablet, Rfl: 2   vitamin B-12 (CYANOCOBALAMIN) 1000 MCG tablet, Take 1,000 mcg by mouth once a week. Takes on Mondays,  Disp: , Rfl:    albuterol (VENTOLIN HFA) 108 (90 Base) MCG/ACT inhaler, Inhale 2 puffs into the lungs every 6 (six) hours as needed for wheezing or shortness of breath., Disp: 8 g, Rfl: 0  Allergies  Allergen Reactions   Codeine Other (See Comments)    GI upset   ROS neg/noncontributory except as noted HPI/below      Objective:     BP 110/75   Pulse 63   Temp 97.7 F (36.5 C) (Temporal)   Resp 18   Ht 5\' 8"  (1.727 m)   Wt 163 lb (73.9 kg)   SpO2 91%   BMI 24.78 kg/m  Wt Readings from Last 3 Encounters:  09/19/23 163 lb (73.9 kg)  09/18/23 164 lb 7.4 oz (74.6 kg)  08/13/23 164 lb 6.4 oz (74.6 kg)    Physical Exam   Gen: WDWN NAD HEENT: NCAT, conjunctiva not injected, sclera nonicteric NECK:  supple, no thyromegaly, no nodes CARDIAC: RRR, S1S2+, .  LUNGS: +wheezes EXT:  no edema MSK: no gross abnormalities.  NEURO: A&O x3.  CN II-XII intact.  PSYCH: normal mood. Good eye contact  Has cherry red nail polish on so pulse ox may be better than recorded  Reviewed ER studies and CT     Assessment & Plan:  Influenza A  Lung mass  Other orders -     Albuterol Sulfate HFA; Inhale 2 puffs into the lungs every 6 (six) hours as needed for wheezing or shortness of breath.  Dispense: 8 g; Refill: 0 -     Oseltamivir Phosphate; Take 1 capsule (75 mg total) by mouth 2 (two) times daily.  Dispense: 10 capsule; Refill: 0 -     Benzonatate; Take 1 capsule (100 mg total) by mouth 3 (three) times daily as needed.  Dispense: 20 capsule; Refill: 0  Assessment and Plan    Influenza A   Acute onset of influenza A was confirmed during an ER visit, presenting with cough, weakness, and fatigue, but no fever. She is nearing the middle of the illness but remains at risk due to age and comorbidities. Tamiflu is recommended despite being beyond the 48-hour window to reduce the risk of complications. It was explained that influenza is viral, and antibiotics are ineffective. Prescribe  Tamiflu 75 mg twice daily. Advise monitoring symptoms and returning to the ER if the condition worsens. Prescribe Tessalon Perles for cough and albuterol inhaler.  Chronic Obstructive Pulmonary Disease (COPD)   Mild emphysema was noted on previous imaging. She reports shortness of breath and wheezing, with no current inhaler use. An albuterol inhaler was discussed to open airways and alleviate symptoms. Prescribe an albuterol inhaler for use as needed every 4-6 hrs. Advise using the inhaler for cough or shortness of breath.  Lung Nodule   A persistent  lung nodule was noted on imaging, with differential diagnoses including malignancy, old infection, or TB. A PET scan is pending for further evaluation. The need for a PET scan and potential TB blood test was discussed(pt declined labs today). Schedule a follow-up with Dr. Durene Cal in two weeks. Consider a TB blood test if indicated by PET scan results.  Hypokalemia   Potassium levels are slightly low, with no vomiting or diarrhea reported, suggesting possible dietary insufficiency or other error as normal 1 mo prior. Advise consuming potassium-rich foods such as bananas, cantaloupe, and potato skins.  General Health Maintenance   She received a flu shot but contracted influenza A. It was discussed that flu vaccines are not always 100% effective but are still recommended annually. Advise on the importance of annual flu vaccination despite the current year's vaccine efficacy.  Follow-up   Schedule a follow-up with Dr. Durene Cal in two weeks. Print after visit summary.        Return for sch appt w/Dr. Durene Cal in 2 wks to disc CT.  Angelena Sole, MD

## 2023-10-03 ENCOUNTER — Encounter: Payer: Self-pay | Admitting: Family Medicine

## 2023-10-04 ENCOUNTER — Other Ambulatory Visit: Payer: Self-pay

## 2023-10-04 DIAGNOSIS — R918 Other nonspecific abnormal finding of lung field: Secondary | ICD-10-CM

## 2023-10-07 ENCOUNTER — Encounter: Payer: Self-pay | Admitting: Family Medicine

## 2023-10-07 ENCOUNTER — Ambulatory Visit (INDEPENDENT_AMBULATORY_CARE_PROVIDER_SITE_OTHER): Payer: Medicare Other | Admitting: Family Medicine

## 2023-10-07 VITALS — BP 132/90 | HR 66 | Temp 97.5°F | Ht 68.0 in | Wt 163.5 lb

## 2023-10-07 DIAGNOSIS — R918 Other nonspecific abnormal finding of lung field: Secondary | ICD-10-CM | POA: Diagnosis not present

## 2023-10-07 DIAGNOSIS — I10 Essential (primary) hypertension: Secondary | ICD-10-CM

## 2023-10-07 DIAGNOSIS — F419 Anxiety disorder, unspecified: Secondary | ICD-10-CM | POA: Diagnosis not present

## 2023-10-07 DIAGNOSIS — F325 Major depressive disorder, single episode, in full remission: Secondary | ICD-10-CM | POA: Diagnosis not present

## 2023-10-07 MED ORDER — BUSPIRONE HCL 5 MG PO TABS: 5.0000 mg | ORAL_TABLET | Freq: Two times a day (BID) | ORAL | 3 refills | Status: DC

## 2023-10-07 MED ORDER — NADOLOL 20 MG PO TABS: 20.0000 mg | ORAL_TABLET | Freq: Every day | ORAL | 3 refills | Status: DC

## 2023-10-07 NOTE — Progress Notes (Signed)
 Phone [phone] In person visit   Subjective:   Kim Singh is a 83 y.o. year old very pleasant female patient who presents for/with See problem oriented charting Chief Complaint  Patient presents with   discuss CT results    Past Medical History-  Patient Active Problem List   Diagnosis Date Noted   Ascending aortic aneurysm (HCC) 10/09/2021    Priority: High   Type II diabetes mellitus with neurological manifestations (HCC) 11/29/2013    Priority: High   Aortic atherosclerosis (HCC) 10/09/2021    Priority: Medium    Emphysema lung (HCC) 10/09/2021    Priority: Medium    Diabetic neuropathy (HCC) 08/22/2015    Priority: Medium    Depression 02/28/2015    Priority: Medium    Dyslipidemia 11/29/2013    Priority: Medium    Hypothyroidism 11/29/2013    Priority: Medium    Essential hypertension 11/29/2013    Priority: Medium    Insomnia 12/19/2018    Priority: Low   Former smoker 02/28/2015    Priority: Low   History of breast cancer 11/30/2013    Priority: Low   History of thyroid cancer 11/30/2013    Priority: Low   Orthostatic hypotension 11/29/2013    Priority: Low   Grief reaction 05/16/2017    Medications- reviewed and updated Current Outpatient Medications  Medication Sig Dispense Refill   albuterol (VENTOLIN HFA) 108 (90 Base) MCG/ACT inhaler Inhale 2 puffs into the lungs every 6 (six) hours as needed for wheezing or shortness of breath. 8 g 0   busPIRone (BUSPAR) 5 MG tablet Take 1 tablet (5 mg total) by mouth 2 (two) times daily. 180 tablet 3   CALCIUM-VITAMIN D PO Take 1 tablet by mouth 2 (two) times daily.      cetirizine (ZYRTEC) 5 MG tablet TAKE 1 TABLET DAILY 90 tablet 3   escitalopram (LEXAPRO) 10 MG tablet Take 1 tablet (10 mg total) by mouth daily. 90 tablet 3   levothyroxine (SYNTHROID) 100 MCG tablet Take 1 tablet (100 mcg total) by mouth daily. 90 tablet 3   Multiple Vitamin (MULTIVITAMIN) tablet Take 1 tablet by mouth daily.      Multiple Vitamins-Minerals (ICAPS LUTEIN & ZEAXANTH, MVI, PO) Take 1 tablet by mouth daily.     Omega-3-acid Ethyl Esters (OMACOR PO) Take 1 tablet by mouth 2 (two) times daily.      rosuvastatin (CRESTOR) 10 MG tablet TAKE 1 TABLET DAILY 90 tablet 3   Spacer/Aero-Hold Chamber Mask MISC Spacer for inhaler 1 each 0   valACYclovir (VALTREX) 1000 MG tablet Take two tablets ( total 2000 mg) by mouth q12h x 1 day; Start: ASAP after symptom onset 6 tablet 2   vitamin B-12 (CYANOCOBALAMIN) 1000 MCG tablet Take 1,000 mcg by mouth once a week. Takes on Mondays     benzonatate (TESSALON PERLES) 100 MG capsule Take 1 capsule (100 mg total) by mouth 3 (three) times daily as needed. (Patient not taking: Reported on 10/07/2023) 20 capsule 0   nadolol (CORGARD) 20 MG tablet but reports has been off of for a week Take 1 tablet (20 mg total) by mouth daily. 90 tablet 3   No current facility-administered medications for this visit.     Objective:  BP (!) 132/90   Pulse 66   Temp (!) 97.5 F (36.4 C)   Ht 5\' 8"  (1.727 m)   Wt 163 lb 8 oz (74.2 kg)   SpO2 94%   BMI 24.86 kg/m  Gen: NAD,  resting comfortably CV: RRR no murmurs rubs or gallops Lungs: CTAB no crackles, wheeze, rhonchi Ext: no edema Skin: warm, dry     Assessment and Plan   # Health maintenance-would like to discuss Prevnar 20 and RSV vaccination in the future with her  # Lung mass/abnormal CT # COPD S:CT angio 10/03/21 admitted to hospital showed "Left upper lobe, right middle lobe airspace disease and to lesser extent left lower lobe airspace disease concerning for multilobar pneumonia."  CT was done due to dry cough and elevated D dimer.  -plan was for repeat CXR in 5-6 weeks to show clearance. Encouraged her to complete XR-ray again 01/05/22.   Also with incidental finding of aortic aneurysm - Seen 06/18/22 and August 07 2022  but no cough or congestion and we planned to repeat CT angiogram in march 2024 anyway so held off  -seen  by another provider and on  CXR 09/26/22 showed "Increasing reticulonodular opacities at the right lung base compatible with progressing pneumonia." And patient was treated for pneumonia  - patient did not see me again until 08/13/23 for chest pain and we ordered cardiology referral as well as CT angiogram which showed largely stable aneurysm but ". Improved aeration of the lungs with persistent spiculated centrally cavitary nodule/mass within the right middle lobe measuring up to 3.5 cm, nonspecific and while potentially the sequela of previous infection and/or inflammation, given persistence since the 09/2021 examination, a bronchogenic neoplasm is not excluded on the basis of this examination. Further evaluation with PET-CT could be performed as indicated."  PET scan 09/13/23 showed "Mildly hypermetabolic cavitary right middle lobe lung mass. Given relative size and morphology stability back to 10/03/2021, chronic atypical infection is favored over indolent neoplasm such as adenocarcinoma. Consider tissue sampling." - we recommended pulmonology visit for their opinion- this is not yest scheduled but was ordered 08/26/23 and again 10/04/23  Today reports no longer having chest pain in some time.  She did have influenza in late February with some slow to resolve shortness of breath but improving.  A/P: 83 year old female with lung mass-we walked through the history as above together explaining the possibility that this could be infectious but we need to rule out/evaluate for neoplasm as well-she is already referred to pulmonology and she feels more confident moving forward with that after discussion today  She has had several episodes with bronchitis/influenza A, pneumonia in the last 2 years with incidental finding of COPD on imaging-I gave her the following note to discussed with pulmonology "in the last 2 years you have had several pneumonias, bronchitis, influenza A and on most recent imaging  suggestion of COPD- in addition to evaluaiton for the lung mass- would like pulmonary opinion on your possible COPD and possible pulmonary function tests if they think helpful."  #right forearm pain- when watching TV at home and having arm up and clicking- can hurt at other times as well- she plans to rest this- no bony pain with palpation today- reports pain is not active  #Tremor-patient and family have noted worsening tremor since she came off of nadolol about a week ago.  Discussed may have essential tremor that was Masked by nadolol.  She agrees to restart the medication.  There is no resting tremor-doubt Parkinson's  #dysphagia- some trouble with bread rice- is going to increase hydration and if not helpful we are going to refer to gastroenterology but want to focus on pulmonary referral   #Hypertension S: controlled on nadolol 20 mg in  the past but ran out about a week ago-she was under the impression I have told her to stop this but I cannot find record of this - in the past on quinapril 5 mg (found to have a fungal lip issue and was not angioedema) A/P: Blood pressure with mild poor control at 132/90 today-encouraged to restart nadolol 20 mg and we could recheck next visit in about a month   Depression/anxiety s: Compliant with Lexapro 10 mg.  Loss of son to suicide in October 2018 contributes.  Unfortunately her daughter who she lives with is dealing with an incredibly challenging marital situation that has been very stressful as well -In general patient has felt more jumpy/tremulous/anxious and years    08/13/2023    2:37 PM 05/30/2023    2:06 PM 09/26/2022    1:30 PM  Depression screen PHQ 2/9  Decreased Interest 0 0 0  Down, Depressed, Hopeless 0 1 0  PHQ - 2 Score 0 1 0  Altered sleeping 1  0  Tired, decreased energy 1  0  Change in appetite 0  0  Feeling bad or failure about yourself  0  0  Trouble concentrating 0  0  Moving slowly or fidgety/restless 0  0  Suicidal thoughts  0  0  PHQ-9 Score 2  0  Difficult doing work/chores Not difficult at all  Not difficult at all  A/P: PHQ 3 has been reasonably well-controlled on recent check but seems to be having an anxiety element-discussed adding buspirone 5 mg twice daily and she is okay with this for anxiety element-recheck next month  Recommended follow up: Return in about 1 month (around 11/07/2023) for followup or sooner if needed.Schedule b4 you leave. Future Appointments  Date Time Provider Department Center  10/24/2023  3:40 PM Elder Negus, MD [facility]  11/12/2023  3:20 PM Shelva Majestic, MD [facility]  02/11/2024  2:00 PM Shelva Majestic, MD [facility]  06/04/2024  1:30 PM [facility] ANNUAL WELLNESS VISIT 1 [facility]    Lab/Order associations:   ICD-10-CM   1. Lung mass  R91.8     2. Essential hypertension  I10     3. Major depressive disorder with single episode, in full remission (HCC)  F32.5     4. Anxiety  F41.9       Meds ordered this encounter  Medications   nadolol (CORGARD) 20 MG tablet    Sig: Take 1 tablet (20 mg total) by mouth daily.    Dispense:  90 tablet    Refill:  3   busPIRone (BUSPAR) 5 MG tablet    Sig: Take 1 tablet (5 mg total) by mouth 2 (two) times daily.    Dispense:  180 tablet    Refill:  3    Return precautions advised.  Tana Conch, MD

## 2023-10-07 NOTE — Patient Instructions (Addendum)
 In the last 2 years you have had several pneumonias, bronchitis, influenza A and on most recent imaging suggestion of COPD- in addition to evaluaiton for the lung mass- would like pulmonary opinion on your possible COPD and possible pulmonary function tests if they think helpful.   Restart nadolol 20 mg/corgard daily  - may help tremor  Call [facility] if you don't hear within a week  Start buspirone 5 mg twice daily for anxiety when you receive- hoping helps with jumpiness sensation  Recommended follow up: Return in about 1 month (around 11/07/2023) for followup or sooner if needed.Schedule b4 you leave.

## 2023-10-08 ENCOUNTER — Other Ambulatory Visit: Payer: Self-pay

## 2023-10-08 ENCOUNTER — Other Ambulatory Visit: Payer: Self-pay | Admitting: Family Medicine

## 2023-10-08 ENCOUNTER — Telehealth: Payer: Self-pay

## 2023-10-08 MED ORDER — BUSPIRONE HCL 5 MG PO TABS: 5.0000 mg | ORAL_TABLET | Freq: Two times a day (BID) | ORAL | 3 refills | Status: DC

## 2023-10-08 MED ORDER — BENZONATATE 100 MG PO CAPS: 100.0000 mg | ORAL_CAPSULE | Freq: Three times a day (TID) | ORAL | 0 refills | Status: DC | PRN

## 2023-10-08 MED ORDER — NADOLOL 20 MG PO TABS
20.0000 mg | ORAL_TABLET | Freq: Every day | ORAL | 3 refills | Status: DC
Start: 2023-10-08 — End: 2023-11-12

## 2023-10-08 NOTE — Telephone Encounter (Signed)
 Rx sent to local [facility].  Copied from CRM [id number]. Topic: Clinical - Prescription Issue >> Oct 08, 2023  1:55 PM Kim Singh R wrote: Reason for CRM: Pt says that [facility] will not be able to deliver the rxs sent until next week for benzonatate (TESSALON PERLES) 100 MG capsule and busPIRone (BUSPAR) 5 MG tablet. Pt asking if new rxs can be sent to her local [facility] instead.

## 2023-10-08 NOTE — Telephone Encounter (Signed)
 Copied from CRM [id number]. Topic: Clinical - Medication Refill >> Oct 08, 2023  3:54 PM Alcus Dad wrote: Most Recent Primary Care Visit:  Provider: Shelva Majestic  Department: [facility]  Visit Type: OFFICE VISIT  Date: 10/07/2023  Medication: nadolol (CORGARD) 20 MG tablet  Has the patient contacted their pharmacy? Yes (Agent: If no, request that the patient contact the pharmacy for the refill. If patient does not wish to contact the pharmacy document the reason why and proceed with request.) (Agent: If yes, when and what did the pharmacy advise?)  Is this the correct pharmacy for this prescription? Yes If no, delete pharmacy and type the correct one.  This is the patient's preferred pharmacy:  [facility] DELIV[facility] - [place], [place] - [address] [place] [address] Phone: [phone] Fax: [phone]  [id number] [place], [place] - [address] AT [facility] [address] & [facility] [address] [place] [address] Phone: [phone] Fax: [phone]  [facility] [address] [place] [address] Phone: [phone] Fax: [phone]   Has the prescription been filled recently? No  Is the patient out of the medication? Yes  Has the patient been seen for an appointment in the last year OR does the patient have an upcoming appointment? Yes  Can we respond through MyChart? Yes  Agent: Please be advised that Rx refills may take up to 3 business days. We ask that you follow-up with your pharmacy.

## 2023-10-24 ENCOUNTER — Encounter: Payer: Self-pay | Admitting: Cardiology

## 2023-10-24 ENCOUNTER — Ambulatory Visit: Payer: Medicare Other | Attending: Cardiology | Admitting: Cardiology

## 2023-10-24 VITALS — BP 118/80 | HR 64 | Resp 16 | Ht 68.0 in | Wt 162.0 lb

## 2023-10-24 DIAGNOSIS — R072 Precordial pain: Secondary | ICD-10-CM | POA: Insufficient documentation

## 2023-10-24 DIAGNOSIS — I7121 Aneurysm of the ascending aorta, without rupture: Secondary | ICD-10-CM | POA: Diagnosis not present

## 2023-10-24 DIAGNOSIS — I700 Atherosclerosis of aorta: Secondary | ICD-10-CM | POA: Insufficient documentation

## 2023-10-24 DIAGNOSIS — I2510 Atherosclerotic heart disease of native coronary artery without angina pectoris: Secondary | ICD-10-CM | POA: Diagnosis not present

## 2023-10-24 DIAGNOSIS — E782 Mixed hyperlipidemia: Secondary | ICD-10-CM | POA: Insufficient documentation

## 2023-10-24 MED ORDER — NITROGLYCERIN 0.4 MG SL SUBL: 0.4000 mg | SUBLINGUAL_TABLET | SUBLINGUAL | 3 refills | Status: AC | PRN

## 2023-10-24 MED ORDER — ASPIRIN 81 MG PO TBEC
81.0000 mg | DELAYED_RELEASE_TABLET | Freq: Every day | ORAL | Status: AC
Start: 2023-10-24 — End: ?

## 2023-10-24 NOTE — Patient Instructions (Addendum)
 Medication Instructions:  START Aspirin 81 mg take one tablet by mouth daily  START AS NEEDED FOR CHEST PAIN Nitroglycerin Place 1 tablet (0.4 mg total) under the tongue every 5 (five) minutes as needed for chest pain   *If you need a refill on your cardiac medications before your next appointment, please call your pharmacy*  Lab Work: Fasting lipid panel   If you have labs (blood work) drawn today and your tests are completely normal, you will receive your results only by: MyChart Message (if you have MyChart) OR A paper copy in the mail If you have any lab test that is abnormal or we need to change your treatment, we will call you to review the results.  Testing/Procedures: Echo  Your physician has requested that you have an echocardiogram. Echocardiography is a painless test that uses sound waves to create images of your heart. It provides your doctor with information about the size and shape of your heart and how well your heart's chambers and valves are working. This procedure takes approximately one hour. There are no restrictions for this procedure. Please do NOT wear cologne, perfume, aftershave, or lotions (deodorant is allowed). Please arrive 15 minutes prior to your appointment time.  Please note: We ask at that you not bring children with you during ultrasound (echo/ vascular) testing. Due to room size and safety concerns, children are not allowed in the ultrasound rooms during exams. Our front office staff cannot provide observation of children in our lobby area while testing is being conducted. An adult accompanying a patient to their appointment will only be allowed in the ultrasound room at the discretion of the ultrasound technician under special circumstances. We apologize for any inconvenience.  Lexiscan   Your physician has requested that you have a lexiscan myoview. For further information please visit [email or web address] Please follow instruction sheet, as given.    Follow-Up: At [facility], you and your health needs are our priority.  As part of our continuing mission to provide you with exceptional heart care, our providers are all part of one team.  This team includes your primary Cardiologist (physician) and Advanced Practice Providers or APPs (Physician Assistants and Nurse Practitioners) who all work together to provide you with the care you need, when you need it.  Your next appointment:   6 month(s)  Provider:   Elder Negus, MD     Other Instructions       1st Floor: - Lobby - Registration  - Pharmacy  - Lab - Cafe  2nd Floor: - PV Lab - Diagnostic Testing (echo, CT, nuclear med)  3rd Floor: - Vacant  4th Floor: - TCTS (cardiothoracic surgery) - AFib Clinic - Structural Heart Clinic - Vascular Surgery  - Vascular Ultrasound  5th Floor: - [facility] (general and EP) - Clinical Pharmacy for coumadin, hypertension, lipid, weight-loss medications, and med management appointments    Valet parking services will be available as well.

## 2023-10-24 NOTE — Progress Notes (Signed)
 Cardiology Office Note:  .   Date:  10/24/2023  ID:  Kim Singh, DOB [birth year], MRN [id number] PCP: Shelva Majestic, MD  [facility] Providers Cardiologist:  Truett Mainland, MD PCP: Shelva Majestic, MD  Chief Complaint  Patient presents with   Chest Pain   New Patient (Initial Visit)     Kim Singh is a 83 y.o. female with hypertension, hyperlipidemia, diet-controlled diabetes, ascending aorta aneurysm, coronary and aortic atherosclerosis, h/o breast and thyroid cancer s/p surgery, chemoradiation  Discussed the use of AI scribe software for clinical note transcription with the patient, who gave verbal consent to proceed.  History of Present Illness The patient, with a past medical history of diabetes, breast cancer, thyroid cancer, and neuropathy, was referred for evaluation due to findings on a recent CT scan. The patient reports occasional chest pain, described as a tightness in the central chest area. The pain episodes, which occur infrequently (approximately six times a year), last for a couple of minutes and often occur at rest, sometimes waking the patient from sleep. The pain resolves on its own. The patient also reports a history of flu about a month ago.  The patient has a history of diabetes, which is currently managed with diet control. The patient had breast cancer and thyroid cancer in the past, for which she underwent surgery, chemotherapy, and radiation. The patient also reports neuropathy, which causes discomfort and affects her balance, limiting her physical activity. The patient does not report any pain in the legs during movement.  Patient is here today with her daughter, with whom she has been having for last 10 years since moving from [place].  She is a former smoker, quit in 1990s.    Vitals:   10/24/23 1554  BP: 118/80  Pulse: 64  Resp: 16  SpO2: 90%      Review of Systems  Cardiovascular:  Positive for chest pain. Negative for  dyspnea on exertion, leg swelling, palpitations and syncope.        Studies Reviewed: Marland Kitchen        EKG 10/24/2023: Normal sinus rhythm Low voltage QRS When compared with ECG of 18-Sep-2023 17:18, T wave inversion no longer evident in Inferior leads  Independently interpreted 08/2023: Chol 165, TG 217, HDL 43, LDL 78 HbA1C 6.9% Hb 14.2 Cr 0.92 TSH 2.8   Physical Exam Vitals and nursing note reviewed.  Constitutional:      General: She is not in acute distress. Neck:     Vascular: No JVD.  Cardiovascular:     Rate and Rhythm: Normal rate and regular rhythm.     Pulses: Decreased pulses.     Heart sounds: Normal heart sounds. No murmur heard. Pulmonary:     Effort: Pulmonary effort is normal.     Breath sounds: Normal breath sounds. No wheezing or rales.  Musculoskeletal:     Right lower leg: No edema.     Left lower leg: No edema.      VISIT DIAGNOSES:   ICD-10-CM   1. Precordial pain  R07.2 EKG 12-Lead    Cardiac Stress Test: Informed Consent Details: Physician/Practitioner Attestation; Transcribe to consent form and obtain patient signature    2. Coronary artery disease involving native coronary artery of native heart without angina pectoris  I25.10 Myocardial Perfusion Imaging    Cardiac Stress Test: Informed Consent Details: Physician/Practitioner Attestation; Transcribe to consent form and obtain patient signature    3. Aneurysm of ascending aorta without rupture (HCC)  I71.21  ECHOCARDIOGRAM COMPLETE    4. Mixed hyperlipidemia  E78.2 Lipid panel    5. Aortic atherosclerosis (HCC)  I70.0 Cardiac Stress Test: Informed Consent Details: Physician/Practitioner Attestation; Transcribe to consent form and obtain patient signature       Kim Singh is a 83 y.o. female with hypertension, hyperlipidemia, diet-controlled diabetes, ascending aorta aneurysm, coronary and aortic atherosclerosis, h/o breast and thyroid cancer s/p surgery, chemoradiation  Assessment and  Plan Assessment & Plan Chest pain: Intermittent central chest pain at rest, atypical for angina. Differential includes GERD or musculoskeletal pain. Coronary calcification noted, but pain not classical for angina. Recommend Lexiscan nuclear stress test for further evaluation.   Given multiple areas of calcification in coronaries and aorta, recommend aspirin 81 mg daily.   Continue Crestor 20 mg daily.  Check fasting lipid panel, as lipid panel in 07/2023 showed mildly elevated triglycerides.   Continue nadolol 20 mg daily that she is currently on.   Added sublingual nitroglycerin for as needed use.    Ascending aorta aneurysm: Mild fusiform dilatation, stable between CT scan in 2023 and 2025, at around 4.1 cm.   I do not think she will need elective surgery anytime soon, possibly never. Will check echocardiogram to correlate with CT scan.   Heart rate and blood pressure very well-controlled on nadolol. Avoid lifting heavy weights or Valsalva maneuver.     Informed Consent   Shared Decision Making/Informed Consent The risks [chest pain, shortness of breath, cardiac arrhythmias, dizziness, blood pressure fluctuations, myocardial infarction, stroke/transient ischemic attack, nausea, vomiting, allergic reaction, radiation exposure, metallic taste sensation and life-threatening complications (estimated to be 1 in 10,000)], benefits (risk stratification, diagnosing coronary artery disease, treatment guidance) and alternatives of a nuclear stress test were discussed in detail with Ms. Jewell and she agrees to proceed.       Meds ordered this encounter  Medications   aspirin EC 81 MG tablet    Sig: Take 1 tablet (81 mg total) by mouth daily. Swallow whole.   nitroGLYCERIN (NITROSTAT) 0.4 MG SL tablet    Sig: Place 1 tablet (0.4 mg total) under the tongue every 5 (five) minutes as needed for chest pain.    Dispense:  30 tablet    Refill:  3     F/u in 6 months  Signed, Elder Negus,  MD

## 2023-11-12 ENCOUNTER — Ambulatory Visit (INDEPENDENT_AMBULATORY_CARE_PROVIDER_SITE_OTHER): Admitting: Family Medicine

## 2023-11-12 ENCOUNTER — Encounter: Payer: Self-pay | Admitting: Family Medicine

## 2023-11-12 VITALS — BP 108/60 | HR 74 | Temp 97.3°F | Ht 68.0 in | Wt 163.4 lb

## 2023-11-12 DIAGNOSIS — R809 Proteinuria, unspecified: Secondary | ICD-10-CM | POA: Diagnosis not present

## 2023-11-12 DIAGNOSIS — F325 Major depressive disorder, single episode, in full remission: Secondary | ICD-10-CM | POA: Diagnosis not present

## 2023-11-12 DIAGNOSIS — E1129 Type 2 diabetes mellitus with other diabetic kidney complication: Secondary | ICD-10-CM | POA: Diagnosis not present

## 2023-11-12 DIAGNOSIS — I10 Essential (primary) hypertension: Secondary | ICD-10-CM | POA: Diagnosis not present

## 2023-11-12 DIAGNOSIS — G250 Essential tremor: Secondary | ICD-10-CM | POA: Diagnosis not present

## 2023-11-12 MED ORDER — AEROCHAMBER PLUS FLO-VU MISC: 1 refills | Status: DC

## 2023-11-12 MED ORDER — BUSPIRONE HCL 5 MG PO TABS: 5.0000 mg | ORAL_TABLET | Freq: Two times a day (BID) | ORAL | 3 refills | Status: AC

## 2023-11-12 MED ORDER — NADOLOL 20 MG PO TABS: 20.0000 mg | ORAL_TABLET | Freq: Every day | ORAL | 3 refills | Status: DC

## 2023-11-12 NOTE — Progress Notes (Signed)
 Phone [phone] In person visit   Subjective:   Kim Singh is a 83 y.o. year old very pleasant female patient who presents for/with See problem oriented charting Chief Complaint  Patient presents with   1 month f/u   Past Medical History-  Patient Active Problem List   Diagnosis Date Noted   Coronary artery disease involving native coronary artery of native heart without angina pectoris 10/24/2023    Priority: High   Ascending aortic aneurysm (HCC) 10/09/2021    Priority: High   Type II diabetes mellitus with neurological manifestations (HCC) 11/29/2013    Priority: High   Essential tremor 11/12/2023    Priority: Medium    Aortic atherosclerosis (HCC) 10/09/2021    Priority: Medium    Emphysema lung (HCC) 10/09/2021    Priority: Medium    Diabetic neuropathy (HCC) 08/22/2015    Priority: Medium    Depression 02/28/2015    Priority: Medium    Dyslipidemia 11/29/2013    Priority: Medium    Hypothyroidism 11/29/2013    Priority: Medium    Essential hypertension 11/29/2013    Priority: Medium    Insomnia 12/19/2018    Priority: Low   Former smoker 02/28/2015    Priority: Low   History of breast cancer 11/30/2013    Priority: Low   History of thyroid  cancer 11/30/2013    Priority: Low   Orthostatic hypotension 11/29/2013    Priority: Low   Precordial pain 10/24/2023   Mixed hyperlipidemia 10/24/2023    Medications- reviewed and updated Current Outpatient Medications  Medication Sig Dispense Refill   albuterol  (VENTOLIN  HFA) 108 (90 Base) MCG/ACT inhaler Inhale 2 puffs into the lungs every 6 (six) hours as needed for wheezing or shortness of breath. 8 g 0   aspirin  EC 81 MG tablet Take 1 tablet (81 mg total) by mouth daily. Swallow whole.     CALCIUM -VITAMIN D PO Take 1 tablet by mouth 2 (two) times daily.      cetirizine  (ZYRTEC ) 5 MG tablet TAKE 1 TABLET DAILY 90 tablet 3   escitalopram  (LEXAPRO ) 10 MG tablet Take 1 tablet (10 mg total) by mouth daily. 90  tablet 3   levothyroxine  (SYNTHROID ) 100 MCG tablet Take 1 tablet (100 mcg total) by mouth daily. 90 tablet 3   Multiple Vitamin (MULTIVITAMIN) tablet Take 1 tablet by mouth daily.     nitroGLYCERIN  (NITROSTAT ) 0.4 MG SL tablet Place 1 tablet (0.4 mg total) under the tongue every 5 (five) minutes as needed for chest pain. 30 tablet 3   Omega-3-acid Ethyl Esters (OMACOR PO) Take 1 tablet by mouth 2 (two) times daily.      rosuvastatin  (CRESTOR ) 10 MG tablet TAKE 1 TABLET DAILY 90 tablet 3   Spacer/Aero-Hold Chamber Mask MISC Spacer for inhaler 1 each 0   Spacer/Aero-Holding Chambers (AEROCHAMBER PLUS) Device Please give one spacer 1 each 1   vitamin B-12 (CYANOCOBALAMIN ) 1000 MCG tablet Take 1,000 mcg by mouth once a week. Takes on Mondays     busPIRone  (BUSPAR ) 5 MG tablet Take 1 tablet (5 mg total) by mouth 2 (two) times daily. 180 tablet 3   nadolol  (CORGARD ) 20 MG tablet Take 1 tablet (20 mg total) by mouth daily. 90 tablet 3   No current facility-administered medications for this visit.     Objective:  BP 108/60   Pulse 74   Temp (!) 97.3 F (36.3 C)   Ht 5\' 8"  (1.727 m)   Wt 163 lb 6.4 oz (74.1 kg)  SpO2 95%   BMI 24.84 kg/m  Gen: NAD, resting comfortably CV: RRR no murmurs rubs or gallops Lungs: CTAB no crackles, wheeze, rhonchi Ext: no edema Skin: warm, dry Neuro: Tremor less pronounced today/not noted    Assessment and Plan   # Lung mass-upcoming visit with pulmonology next week  # Precordial pain-echocardiogram pending as well as stress testing-did not report recent issues  #Diabetes with neuropathy S: Most recently diet controlled Lab Results  Component Value Date   HGBA1C 6.9 (H) 08/19/2023   HGBA1C 6.6 (H) 06/18/2022   HGBA1C 6.5 01/05/2022   A/P: Diabetes has been well-controlled  I discussed the microalbumin to creatinine lab error with patient that occurred with [facility] labs.  Essentially the ratio was off by a factor of 10.  In this patient's  individual case she has had a ratio as high as 171 (with adjustment) which trended down on most recent check to 35-discussed option of starting medicine back like quinapril  but I do not think her blood pressure can tolerate it and her tremor has been better back on nadolol  and last GFR actually improved-we opted to continue current medication for now  #Hypertension S: Medication: Nadolol  20 mg daily - in the past on quinapril  5 mg (found to have a fungal lip issue and was not angioedema) A/P: Essential tremor improved.  Blood pressure well-controlled-in fact perhaps mildly overly controlled but she is not having any recent lightheadedness-we opted to monitor and hold off on restarting quinapril  for microalbuminuria for now   Depression  s: Compliant with Lexapro  10 mg.   -She was seeming rather jittery or jumpy previously but buspirone  was helpful  A/P: Depression is well-controlled and jumpiness improved as well-continue current medication including buspirone  and Lexapro   Recommended follow up: Return for next already scheduled visit or sooner if needed. Future Appointments  Date Time Provider Department Center  11/18/2023  3:30 PM Raejean Bullock, NP [facility] None  11/25/2023  2:45 PM [facility] E[facility] 3 [id number]  11/27/2023 10:45 AM [facility] NM2/TREAD [id number] [facility]  02/11/2024  2:00 PM Almira Jaeger, MD [facility]  06/04/2024  1:30 PM [facility] ANNUAL WELLNESS VISIT 1 [facility] P[facility]    Lab/Order associations:   ICD-10-CM   1. Essential tremor  G25.0     2. Essential hypertension  I10     3. Major depressive disorder with single episode, in full remission (HCC)  F32.5     4. Diabetes mellitus with microalbuminuria (HCC)  E11.29    R80.9       Meds ordered this encounter  Medications   busPIRone  (BUSPAR ) 5 MG tablet    Sig: Take 1 tablet (5 mg total) by mouth 2 (two) times daily.    Dispense:  180 tablet    Refill:  3   nadolol  (CORGARD ) 20 MG tablet     Sig: Take 1 tablet (20 mg total) by mouth daily.    Dispense:  90 tablet    Refill:  3   Spacer/Aero-Holding Chambers (AEROCHAMBER PLUS) Device    Sig: Please give one spacer    Dispense:  1 each    Refill:  1    Return precautions advised.  Clarisa Crooked, MD

## 2023-11-12 NOTE — Patient Instructions (Addendum)
 Glad things are going better and the medicines is helping with both tremor (nadolol ) and jumpiness (buspirone )- lets continue current medications and I am hoping for a good and helpful visit with pulmonology next week  Recommended follow up: Return for next already scheduled visit or sooner if needed.

## 2023-11-18 ENCOUNTER — Encounter: Payer: Self-pay | Admitting: Acute Care

## 2023-11-18 ENCOUNTER — Other Ambulatory Visit: Payer: Self-pay | Admitting: Family Medicine

## 2023-11-18 ENCOUNTER — Ambulatory Visit: Admitting: Acute Care

## 2023-11-18 VITALS — BP 133/82 | HR 60 | Ht 67.0 in | Wt 162.6 lb

## 2023-11-18 DIAGNOSIS — E039 Hypothyroidism, unspecified: Secondary | ICD-10-CM

## 2023-11-18 DIAGNOSIS — Z853 Personal history of malignant neoplasm of breast: Secondary | ICD-10-CM

## 2023-11-18 DIAGNOSIS — Z85850 Personal history of malignant neoplasm of thyroid: Secondary | ICD-10-CM

## 2023-11-18 DIAGNOSIS — R911 Solitary pulmonary nodule: Secondary | ICD-10-CM

## 2023-11-18 NOTE — Progress Notes (Signed)
 History of Present Illness Kim Singh is a 83 y.o. female former smoker with a history of breast cancer in 2000, and thyroid  cancer in 2009. She was referred by Dr. Arlene Ben 10/2023 for a right middle lung mass. She will be followed by Dr. Baldwin Levee  Patient has a 41-pack-year smoking history.  She quit smoking August 02, 1997.  Pt. Has consented to use of [employer] soft wear to help capture the content of this OV.  PMH Past Medical History - History of flu A - History of cough - History of pneumonia - History of breast cancer - History of thyroid  disease - History of thoracic aneurysm     11/18/2023 Dlila Singh is an 83 year old female who presents for evaluation of a persistent lung nodule.  The lung  nodule in the right middle lobe was initially identified on a CTA Chest done 08/22/2023 after a severe bout of influenza A and pneumonia  resulting in a prolonged recovery period. This showed  a persistent spiculated centrally cavitary nodule/mass within the right middle lobe measuring 3.5 x 3.1 x 2.0 cm  with associated ill-defined adjacent ground-glass. PET scan was ordered as follow up  in February 2025. The scan revealed a mildly hypermetabolic cavitary nodule measuring 3.5 cm, with no evidence of distant metastasis. The nodule has remained stable in size since a previous scan in March 2023, It has an SUV of  3.1 on PET.. Radiology note relative size and morphology show stability back to 10/03/2021, chronic atypical infection is favored over indolent neoplasm such as adenocarcinoma.   I discussed the Ct Chest and PET results with the patient and her daughter.  It has been about 3 months since the last CT scan, and about 2 months since the PET scan.  We decided that the best option was to get a current CT of the chest and based on results determine next best step in regard to plan of care.  Our options are to continue CT surveillance of the nodule versus going ahead and biopsying the nodule  to determine definitive diagnosis.  Patient is 83 years old, she is in good shape, and based on results of the CT scan ordered today she will determine what she feels is her best option.  Patient's daughter is in agreement with this plan.  Patient  has a history of recurrent respiratory infections, including a severe bout of pneumonia in 2023 that required a week-long hospitalization. No recent breathing difficulties are reported. In the review of systems, she denies any unintentional weight loss or recent breathing difficulties.  Her past medical history includes breast cancer treated with lumpectomy, chemotherapy, and radiation, and thyroid  cancer treated with thyroidectomy. She also has a thoracic aneurysm under monitoring. She reports intentional weight loss and is satisfied with her current weight management efforts.  Test Results: PET Scan 09/13/2023 No areas of abnormal hypermetabolism.   Incidental CT findings: No cervical adenopathy.   CHEST: No thoracic nodal hypermetabolism. The centrally cavitary right middle lobe lung mass measures 3.5 x 2.2 cm and a S.U.V. max of 3.1 on 83/4. This is similar in size to the 08/22/2023 CT. 3.2 x 2.3 cm on 10/03/2021 (when remeasured).   Incidental CT findings: Centrilobular emphysema. Right lower lobe volume loss adjacent to an elevated right hemidiaphragm. Mildly hypermetabolic cavitary right middle lobe lung mass. Given relative size and morphology stability back to 10/03/2021, chronic atypical infection is favored over indolent neoplasm such as adenocarcinoma. Consider tissue sampling. No hypermetabolic nodal  or distant metastasis.     Latest Ref Rng & Units 09/18/2023    6:07 PM 08/19/2023   10:07 AM 06/18/2022    4:00 PM  CBC  WBC 4.0 - 10.5 K/uL 8.0  6.1  7.0   Hemoglobin 12.0 - 15.0 g/dL 81.1  91.4  78.2   Hematocrit 36.0 - 46.0 % 42.8  42.1  45.3   Platelets 150 - 400 K/uL 147  196.0  208.0        Latest Ref Rng & Units  09/18/2023    6:07 PM 08/19/2023   10:07 AM 06/18/2022    4:00 PM  BMP  Glucose 70 - 99 mg/dL 956  213  086   BUN 8 - 23 mg/dL 26  12  20    Creatinine 0.44 - 1.00 mg/dL 5.78  4.69  6.29   Sodium 135 - 145 mmol/L 137  139  140   Potassium 3.5 - 5.1 mmol/L 3.3  4.3  4.1   Chloride 98 - 111 mmol/L 95  98  97   CO2 22 - 32 mmol/L 29  33  36   Calcium  8.9 - 10.3 mg/dL 9.2  9.5  52.8     BNP    Component Value Date/Time   BNP 100.0 10/02/2021 0948    ProBNP No results found for: "PROBNP"  PFT No results found for: "FEV1PRE", "FEV1POST", "FVCPRE", "FVCPOST", "TLC", "DLCOUNC", "PREFEV1FVCRT", "PSTFEV1FVCRT"  No results found.   Past medical hx Past Medical History:  Diagnosis Date   Breast cancer (HCC) 2001   Left   Diabetes mellitus type 2, uncomplicated (HCC)    Personal history of chemotherapy    Personal history of radiation therapy    Pure hypercholesterolemia    Unspecified disorder of thyroid     Unspecified essential hypertension      Social History   Tobacco Use   Smoking status: Former    Current packs/day: 0.00    Average packs/day: 1 pack/day for 41.0 years (41.0 ttl pk-yrs)    Types: Cigarettes    Start date: 08/02/1956    Quit date: 08/02/1997    Years since quitting: 26.3   Smokeless tobacco: Never  Vaping Use   Vaping status: Never Used  Substance Use Topics   Alcohol use: Yes    Alcohol/week: 7.0 standard drinks of alcohol    Types: 7 Glasses of wine per week    Comment: glass of wine every day   Drug use: No    Ms.Griess reports that she quit smoking about 26 years ago. Her smoking use included cigarettes. She started smoking about 67 years ago. She has a 41 pack-year smoking history. She has never used smokeless tobacco. She reports current alcohol use of about 7.0 standard drinks of alcohol per week. She reports that she does not use drugs.  Tobacco Cessation: Counseling given: Not Answered Patient has a 41-pack-year smoking history.  She quit  smoking August 02, 1997.  Past surgical hx, Family hx, Social hx all reviewed.  Current Outpatient Medications on File Prior to Visit  Medication Sig   albuterol  (VENTOLIN  HFA) 108 (90 Base) MCG/ACT inhaler Inhale 2 puffs into the lungs every 6 (six) hours as needed for wheezing or shortness of breath.   aspirin  EC 81 MG tablet Take 1 tablet (81 mg total) by mouth daily. Swallow whole.   busPIRone  (BUSPAR ) 5 MG tablet Take 1 tablet (5 mg total) by mouth 2 (two) times daily.   CALCIUM -VITAMIN D PO Take 1  tablet by mouth 2 (two) times daily.    cetirizine  (ZYRTEC ) 5 MG tablet TAKE 1 TABLET DAILY   escitalopram  (LEXAPRO ) 10 MG tablet Take 1 tablet (10 mg total) by mouth daily.   Multiple Vitamin (MULTIVITAMIN) tablet Take 1 tablet by mouth daily.   nadolol  (CORGARD ) 20 MG tablet Take 1 tablet (20 mg total) by mouth daily.   nitroGLYCERIN  (NITROSTAT ) 0.4 MG SL tablet Place 1 tablet (0.4 mg total) under the tongue every 5 (five) minutes as needed for chest pain.   Omega-3-acid Ethyl Esters (OMACOR PO) Take 1 tablet by mouth 2 (two) times daily.    rosuvastatin  (CRESTOR ) 10 MG tablet TAKE 1 TABLET DAILY   Spacer/Aero-Hold Chamber Mask MISC Spacer for inhaler   Spacer/Aero-Holding Chambers (AEROCHAMBER PLUS) Device Please give one spacer   vitamin B-12 (CYANOCOBALAMIN ) 1000 MCG tablet Take 1,000 mcg by mouth once a week. Takes on Mondays   No current facility-administered medications on file prior to visit.     Allergies  Allergen Reactions   Codeine Other (See Comments)    GI upset    Review Of Systems:  Constitutional:   No  weight loss, night sweats,  Fevers, chills, fatigue, or  lassitude.  HEENT:   No headaches,  Difficulty swallowing,  Tooth/dental problems, or  Sore throat,                No sneezing, itching, ear ache, nasal congestion, post nasal drip,   CV:  No chest pain,  Orthopnea, PND, swelling in lower extremities, anasarca, dizziness, palpitations, syncope.   GI  No  heartburn, indigestion, abdominal pain, nausea, vomiting, diarrhea, change in bowel habits, loss of appetite, bloody stools.   Resp: No shortness of breath with exertion or at rest.  No excess mucus, no productive cough,  No non-productive cough,  No coughing up of blood.  No change in color of mucus.  No wheezing.  No chest wall deformity  Skin: no rash or lesions.  GU: no dysuria, change in color of urine, no urgency or frequency.  No flank pain, no hematuria   MS:  No joint pain or swelling.  No decreased range of motion.  No back pain.  Psych:  No change in mood or affect. No depression or anxiety.  No memory loss.   Vital Signs BP 133/82 (BP Location: Right Arm, Patient Position: Sitting, Cuff Size: Normal)   Pulse 60   Ht 5\' 7"  (1.702 m)   Wt 162 lb 9.6 oz (73.8 kg)   SpO2 91%   BMI 25.47 kg/m    Physical Exam:  General- No distress,  A&Ox3, pleasant ENT: No sinus tenderness, TM clear, pale nasal mucosa, no oral exudate,no post nasal drip, no LAN Cardiac: S1, S2, regular rate and rhythm, no murmur Chest: No wheeze/ rales/ dullness; no accessory muscle use, no nasal flaring, no sternal retractions Abd.: Soft Non-tender, ND, BS +, Body mass index is 25.47 kg/m.  Ext: No clubbing cyanosis, edema, no obvious deformities Neuro:  normal strength, MAE x 4, A&O x 3, appropriate Skin: No rashes, warm and dry, No obvious lesions Psych: normal mood and behavior   Assessment/Plan Persistent right middle lobe lung nodule, low level SUV on PET Persistent spiculated centrally cavitary nodule mass in the right middle lobe, stable in size since March 2023.  No nodal hypermetabolism or distant metastasis on PET scan. Differential includes chronic atypical infection, indolent neoplasm, or bronchogenic neoplasm. Biopsy may be warranted to rule out malignancy. - Order  CT chest without contrast now to re-evaluate the nodule. - Schedule follow-up appointment within 1-2 weeks after CT scan to  discuss results and potential biopsy  vs watchful waiting if nodule remains unchanged.  Thoracic aortic aneurysm - Monitored by cardiology under Dr. Jolly Needle.  - Upcoming echocardiogram scheduled.  Breast cancer diagnosed 2000 Left breast cancer treated with lumpectomy, chemotherapy, and radiation.  No current evidence of recurrence. - Surveillance per oncology/ PCP  Thyroid  cancer Thyroid  cancer treated with thyroidectomy and radioactive iodine therapy.  No current evidence of recurrence. - Surveillance per oncology/ PCP   I spent 35 minutes dedicated to the care of this patient on the date of this encounter to include pre-visit review of records, face-to-face time with the patient discussing conditions above, post visit ordering of testing, clinical documentation with the electronic health record, making appropriate referrals as documented, and communicating necessary information to the patient's healthcare team.   Raejean Bullock, NP 11/18/2023  4:21 PM

## 2023-11-18 NOTE — Patient Instructions (Signed)
 It is good to see you today. Your PET scan shows some low level hypermetabolic activity. The nodule itself looks like it has been stable since 2023.  We will do a repeat CT Chest now, as the last scan was done 07/2023.  You will get a call to get this scheduled. You will follow up with me 1-2 weeks after the scan has been completed.  Call if you need us  sooner. Please contact office for sooner follow up if symptoms do not improve or worsen or seek emergency care

## 2023-11-19 ENCOUNTER — Encounter (HOSPITAL_COMMUNITY): Payer: Self-pay

## 2023-11-25 ENCOUNTER — Ambulatory Visit (HOSPITAL_COMMUNITY): Attending: Cardiovascular Disease

## 2023-11-25 DIAGNOSIS — I7121 Aneurysm of the ascending aorta, without rupture: Secondary | ICD-10-CM | POA: Diagnosis not present

## 2023-11-25 LAB — ECHOCARDIOGRAM COMPLETE
Area-P 1/2: 2.69 cm2
S' Lateral: 2.95 cm

## 2023-11-26 ENCOUNTER — Other Ambulatory Visit: Payer: Self-pay | Admitting: Cardiology

## 2023-11-26 ENCOUNTER — Encounter: Payer: Self-pay | Admitting: Cardiology

## 2023-11-26 DIAGNOSIS — I2510 Atherosclerotic heart disease of native coronary artery without angina pectoris: Secondary | ICD-10-CM

## 2023-11-27 ENCOUNTER — Ambulatory Visit (HOSPITAL_COMMUNITY): Attending: Cardiology

## 2023-11-27 DIAGNOSIS — I2510 Atherosclerotic heart disease of native coronary artery without angina pectoris: Secondary | ICD-10-CM | POA: Diagnosis not present

## 2023-11-27 DIAGNOSIS — I700 Atherosclerosis of aorta: Secondary | ICD-10-CM | POA: Diagnosis not present

## 2023-11-27 LAB — MYOCARDIAL PERFUSION IMAGING
LV dias vol: 70 mL (ref 46–106)
LV sys vol: 26 mL
Nuc Stress EF: 63 %
Peak HR: 73 {beats}/min
Rest HR: 56 {beats}/min
Rest Nuclear Isotope Dose: 9.9 mCi
SDS: 1
SRS: 3
SSS: 3
ST Depression (mm): 0 mm
Stress Nuclear Isotope Dose: 30.1 mCi
TID: 1.05

## 2023-11-27 MED ORDER — REGADENOSON 0.4 MG/5ML IV SOLN
0.4000 mg | Freq: Once | INTRAVENOUS | Status: AC
  Administered 2023-11-27: 0.4 mg via INTRAVENOUS

## 2023-11-27 MED ORDER — TECHNETIUM TC 99M TETROFOSMIN IV KIT
9.9000 | PACK | Freq: Once | INTRAVENOUS | Status: AC | PRN
  Administered 2023-11-27: 9.9 via INTRAVENOUS

## 2023-11-27 MED ORDER — TECHNETIUM TC 99M TETROFOSMIN IV KIT
30.1000 | PACK | Freq: Once | INTRAVENOUS | Status: AC | PRN
Start: 2023-11-27 — End: 2023-11-27
  Administered 2023-11-27: 30.1 via INTRAVENOUS

## 2023-11-27 MED ORDER — REGADENOSON 0.4 MG/5ML IV SOLN
INTRAVENOUS | Status: AC
  Filled 2023-11-27: qty 5

## 2023-11-29 ENCOUNTER — Telehealth: Payer: Self-pay

## 2023-11-29 ENCOUNTER — Encounter: Payer: Self-pay | Admitting: Cardiology

## 2023-11-29 NOTE — Telephone Encounter (Signed)
 Copied from CRM [id number]. Topic: Clinical - Request for Lab/Test Order >> Nov 29, 2023 11:23 AM Isabell A wrote: Reason for CRM: Patient wants to confirm if she still needs to have CT CHEST LCS NODULE F/U LOW DOSE WO CONTRAST scheduled - states she was under the impression Isa Manuel would have this scheduled for her.   ATC x1. LVMTCB Routing to [facility] to schedule scan as it was expected 11/25/2023 Please advise

## 2023-12-03 ENCOUNTER — Other Ambulatory Visit: Payer: Self-pay | Admitting: Acute Care

## 2023-12-03 DIAGNOSIS — R911 Solitary pulmonary nodule: Secondary | ICD-10-CM

## 2023-12-05 ENCOUNTER — Ambulatory Visit
Admission: RE | Admit: 2023-12-05 | Discharge: 2023-12-05 | Disposition: A | Discharge status: Home / Self Care | Source: Ambulatory Visit | Attending: Acute Care | Admitting: Acute Care

## 2023-12-05 DIAGNOSIS — R911 Solitary pulmonary nodule: Secondary | ICD-10-CM

## 2023-12-05 DIAGNOSIS — R918 Other nonspecific abnormal finding of lung field: Secondary | ICD-10-CM | POA: Diagnosis not present

## 2023-12-05 DIAGNOSIS — J439 Emphysema, unspecified: Secondary | ICD-10-CM | POA: Diagnosis not present

## 2023-12-05 DIAGNOSIS — J40 Bronchitis, not specified as acute or chronic: Secondary | ICD-10-CM | POA: Diagnosis not present

## 2023-12-05 DIAGNOSIS — I7121 Aneurysm of the ascending aorta, without rupture: Secondary | ICD-10-CM | POA: Diagnosis not present

## 2023-12-08 ENCOUNTER — Ambulatory Visit: Payer: Self-pay | Admitting: Cardiology

## 2023-12-08 NOTE — Progress Notes (Signed)
 No significant heart muscle circulation abnormalities noted on stress test. Continue current medications including Aspirin  and statin.  Thanks MJP

## 2023-12-12 ENCOUNTER — Ambulatory Visit: Admitting: Pulmonary Disease

## 2023-12-23 ENCOUNTER — Encounter: Payer: Self-pay | Admitting: Acute Care

## 2023-12-31 ENCOUNTER — Ambulatory Visit: Admitting: Acute Care

## 2024-01-10 ENCOUNTER — Telehealth: Payer: Self-pay | Admitting: Acute Care

## 2024-01-10 ENCOUNTER — Encounter: Payer: Self-pay | Admitting: Emergency Medicine

## 2024-01-10 ENCOUNTER — Other Ambulatory Visit: Payer: Self-pay | Admitting: Acute Care

## 2024-01-10 ENCOUNTER — Encounter: Payer: Self-pay | Admitting: Acute Care

## 2024-01-10 ENCOUNTER — Ambulatory Visit: Admitting: Acute Care

## 2024-01-10 VITALS — BP 133/90 | HR 59 | Ht 68.0 in | Wt 160.4 lb

## 2024-01-10 DIAGNOSIS — Z87891 Personal history of nicotine dependence: Secondary | ICD-10-CM

## 2024-01-10 DIAGNOSIS — R911 Solitary pulmonary nodule: Secondary | ICD-10-CM

## 2024-01-10 DIAGNOSIS — R0609 Other forms of dyspnea: Secondary | ICD-10-CM

## 2024-01-10 MED ORDER — ALBUTEROL SULFATE HFA 108 (90 BASE) MCG/ACT IN AERS: 2.0000 | INHALATION_SPRAY | Freq: Four times a day (QID) | RESPIRATORY_TRACT | 2 refills | Status: DC | PRN

## 2024-01-10 NOTE — Telephone Encounter (Signed)
 Letter given by [facility] # [id number] case#

## 2024-01-10 NOTE — H&P (View-Only) (Signed)
 History of Present Illness Kim Singh is a 83 y.o. female former smoker with a history of breast cancer in 2000, and thyroid  cancer in 2009. She was referred by Dr. Arlene Ben 10/2023 for a right middle lung mass. She will be followed by Dr. Baldwin Levee   Patient has a 41-pack-year smoking history.  She quit smoking August 02, 1997.  PMH Past Medical History - History of flu A - History of cough - History of pneumonia - History of breast cancer - History of thyroid  disease - History of thoracic aneurysm   Synopsis Pt. Referred for evaluation of a  right middle lobe pulmonary nodule that was initially identified on a CTA Chest done 08/22/2023 after a severe bout of influenza A and pneumonia  resulting in a prolonged recovery period.  This showed  a persistent spiculated centrally cavitary nodule/mass within the right middle lobe measuring 3.5 x 3.1 x 2.0 cm  with associated ill-defined adjacent ground-glass. PET scan was ordered as follow up  in February 2025. The scan revealed a mildly hypermetabolic cavitary nodule measuring 3.5 cm, with no evidence of distant metastasis. The nodule has remained stable in size since a previous scan in March 2023, It has an SUV of  3.1 on PET.. Radiology note relative size and morphology show stability back to 10/03/2021, chronic atypical infection is favored over indolent neoplasm such as adenocarcinoma.    I discussed the Ct Chest and PET results with the patient and her daughter.  It has been about 3 months since the last CT scan, and about 2 months since the PET scan.  We decided that the best option was to get a current CT of the chest and based on results determine next best step in regard to plan of care.  Our options are to continue CT surveillance of the nodule versus going ahead and biopsying the nodule to determine definitive diagnosis.  Patient is 83 years old, she is in good shape, and based on results of the CT scan ordered today she will determine what  she feels is her best option.    Her past medical history includes recurrent respiratory infections, breast cancer treated with lumpectomy, chemotherapy, and radiation, and thyroid  cancer treated with thyroidectomy. She also has a thoracic aneurysm under monitoring. She reports intentional weight loss and is satisfied with her current weight management efforts.      Pt. Has consented to use of [employer] soft wear to help capture the content of this OV.   01/10/2024 Patient presents for follow-up with her daughter.  We have been following a cavitary mass in the right middle lobe.  On PET scan in January 2025 the right middle lobe showed an SUV max of 3.1.  At that time we decided to wait 3 months and reevaluate to see if there has been any growth.  CT scan done May 2025 has been reviewed by the 3 of us  today.  While the nodule appears to be similar in size, there is thickening of the wall of the mass.  Recommendation by radiology was to go ahead and move on with tissue diagnosis.  Especially since this did show low metabolic activity on PET.  The patient, her daughter and I had a long discussion regarding we could continue to watch this every 3 months with CT scans, or we could go ahead and biopsy it to determine whether or not it is cancer.  While patient is 83 years old she is in excellent health. Both the  patient and her daughter agree with moving forward with bronchoscopy with biopsies for definitive tissue diagnosis.  I actually agree with this decision.  I have reviewed the risks to include bleeding, infection, pneumothorax, and adverse reaction to anesthesia.  Patient and her daughter asked questions and are in agreement with moving forward with navigational bronchoscopy with biopsies.  Test Results: CT Chest 12/05/2023 3.7 x 2.2 cm cavitary mass in the right middle lobe with pleural stranding and surrounding ground-glass haziness. Its overall size has not changed over the serial studies but  its wall has become thicker over time. Given the SUV max of 3.1 over the solid portion on PET-CT, this is somewhat concerning for a slow growing adenocarcinoma. Tissue sampling is recommended. 2. Additional stable small lung nodules. 3. Emphysema and bronchitis. 4. Aortic and coronary artery atherosclerosis. 5. Stable 4.1 cm ascending aortic aneurysm. Annual CTA or MRA follow-up recommended. 6. Osteopenia and degenerative change. 7. Postsurgical changes in the left breast. There are no mammograms listed in our system since 2018. Consider follow-up screening mammography unless it was done recently elsewhere.   PET Scan 09/13/2023 No areas of abnormal hypermetabolism.   Incidental CT findings: No cervical adenopathy.   CHEST: No thoracic nodal hypermetabolism. The centrally cavitary right middle lobe lung mass measures 3.5 x 2.2 cm and a S.U.V. max of 3.1 on 83/4. This is similar in size to the 08/22/2023 CT. 3.2 x 2.3 cm on 10/03/2021 (when remeasured).   Incidental CT findings: Centrilobular emphysema. Right lower lobe volume loss adjacent to an elevated right hemidiaphragm. Mildly hypermetabolic cavitary right middle lobe lung mass. Given relative size and morphology stability back to 10/03/2021, chronic atypical infection is favored over indolent neoplasm such as adenocarcinoma. Consider tissue sampling. No hypermetabolic nodal or distant metastasis.      Latest Ref Rng & Units 09/18/2023    6:07 PM 08/19/2023   10:07 AM 06/18/2022    4:00 PM  CBC  WBC 4.0 - 10.5 K/uL 8.0  6.1  7.0   Hemoglobin 12.0 - 15.0 g/dL 16.1  09.6  04.5   Hematocrit 36.0 - 46.0 % 42.8  42.1  45.3   Platelets 150 - 400 K/uL 147  196.0  208.0        Latest Ref Rng & Units 09/18/2023    6:07 PM 08/19/2023   10:07 AM 06/18/2022    4:00 PM  BMP  Glucose 70 - 99 mg/dL 409  811  914   BUN 8 - 23 mg/dL 26  12  20    Creatinine 0.44 - 1.00 mg/dL 7.82  9.56  2.13   Sodium 135 - 145 mmol/L 137  139  140    Potassium 3.5 - 5.1 mmol/L 3.3  4.3  4.1   Chloride 98 - 111 mmol/L 95  98  97   CO2 22 - 32 mmol/L 29  33  36   Calcium  8.9 - 10.3 mg/dL 9.2  9.5  08.6     BNP    Component Value Date/Time   BNP 100.0 10/02/2021 0948    ProBNP No results found for: PROBNP  PFT No results found for: FEV1PRE, FEV1POST, FVCPRE, FVCPOST, TLC, DLCOUNC, PREFEV1FVCRT, PSTFEV1FVCRT  No results found.   Past medical hx Past Medical History:  Diagnosis Date   Breast cancer (HCC) 2001   Left   Diabetes mellitus type 2, uncomplicated (HCC)    Personal history of chemotherapy    Personal history of radiation therapy    Pure hypercholesterolemia  Unspecified disorder of thyroid     Unspecified essential hypertension      Social History   Tobacco Use   Smoking status: Former    Current packs/day: 0.00    Average packs/day: 1 pack/day for 41.0 years (41.0 ttl pk-yrs)    Types: Cigarettes    Start date: 08/02/1956    Quit date: 08/02/1997    Years since quitting: 26.4   Smokeless tobacco: Never  Vaping Use   Vaping status: Never Used  Substance Use Topics   Alcohol use: Yes    Alcohol/week: 7.0 standard drinks of alcohol    Types: 7 Glasses of wine per week    Comment: glass of wine every day   Drug use: No    Ms.Tucci reports that she quit smoking about 26 years ago. Her smoking use included cigarettes. She started smoking about 67 years ago. She has a 41 pack-year smoking history. She has never used smokeless tobacco. She reports current alcohol use of about 7.0 standard drinks of alcohol per week. She reports that she does not use drugs.  Tobacco Cessation: Patient is a former smoker quit in 1999   Past surgical hx, Family hx, Social hx all reviewed.  Current Outpatient Medications on File Prior to Visit  Medication Sig   albuterol  (VENTOLIN  HFA) 108 (90 Base) MCG/ACT inhaler Inhale 2 puffs into the lungs every 6 (six) hours as needed for wheezing or shortness of  breath.   aspirin  EC 81 MG tablet Take 1 tablet (81 mg total) by mouth daily. Swallow whole.   busPIRone  (BUSPAR ) 5 MG tablet Take 1 tablet (5 mg total) by mouth 2 (two) times daily.   CALCIUM -VITAMIN D PO Take 1 tablet by mouth 2 (two) times daily.    cetirizine  (ZYRTEC ) 5 MG tablet TAKE 1 TABLET DAILY   escitalopram  (LEXAPRO ) 10 MG tablet Take 1 tablet (10 mg total) by mouth daily.   Multiple Vitamin (MULTIVITAMIN) tablet Take 1 tablet by mouth daily.   nadolol  (CORGARD ) 20 MG tablet Take 1 tablet (20 mg total) by mouth daily.   nitroGLYCERIN  (NITROSTAT ) 0.4 MG SL tablet Place 1 tablet (0.4 mg total) under the tongue every 5 (five) minutes as needed for chest pain.   Omega-3-acid Ethyl Esters (OMACOR PO) Take 1 tablet by mouth 2 (two) times daily.    rosuvastatin  (CRESTOR ) 10 MG tablet TAKE 1 TABLET DAILY   Spacer/Aero-Hold Chamber Mask MISC Spacer for inhaler   Spacer/Aero-Holding Chambers (AEROCHAMBER PLUS) Device Please give one spacer   SYNTHROID  100 MCG tablet TAKE 1 TABLET DAILY   vitamin B-12 (CYANOCOBALAMIN ) 1000 MCG tablet Take 1,000 mcg by mouth once a week. Takes on Mondays   No current facility-administered medications on file prior to visit.     Allergies  Allergen Reactions   Codeine Other (See Comments)    GI upset    Review Of Systems:  Constitutional:   No  weight loss, night sweats,  Fevers, chills, fatigue, or  lassitude.  HEENT:   No headaches,  Difficulty swallowing,  Tooth/dental problems, or  Sore throat,                No sneezing, itching, ear ache, nasal congestion, post nasal drip,   CV:  No chest pain,  Orthopnea, PND, swelling in lower extremities, anasarca, dizziness, palpitations, syncope.   GI  No heartburn, indigestion, abdominal pain, nausea, vomiting, diarrhea, change in bowel habits, loss of appetite, bloody stools.   Resp: No shortness of breath with exertion  or at rest.  No excess mucus, no productive cough,  No non-productive cough,  No  coughing up of blood.  No change in color of mucus.  No wheezing.  No chest wall deformity  Skin: no rash or lesions.  GU: no dysuria, change in color of urine, no urgency or frequency.  No flank pain, no hematuria   MS:  No joint pain or swelling.  No decreased range of motion.  No back pain.  Psych:  No change in mood or affect. No depression or anxiety.  No memory loss.   Vital Signs BP (!) 133/90 (BP Location: Right Arm, Patient Position: Sitting, Cuff Size: Normal)   Pulse (!) 59   Ht 5' 8 (1.727 m)   Wt 160 lb 6.4 oz (72.8 kg)   SpO2 93%   BMI 24.39 kg/m    Physical Exam:  General- No distress,  A&Ox3, pleasant elderly female ENT: No sinus tenderness, TM clear, pale nasal mucosa, no oral exudate,no post nasal drip, no LAN Cardiac: S1, S2, regular rate and rhythm, no murmur Chest: No wheeze/ rales/ dullness; no accessory muscle use, no nasal flaring, no sternal retractions, slightly diminished per bases bilaterally Abd.: Soft Non-tender, nondistended, bowel sounds positive,Body mass index is 24.39 kg/m.  Ext: No clubbing cyanosis, edema, no obvious deformities Neuro:  normal strength, moving all extremities x 4, alert and oriented x 3, appropriate Skin: No rashes, warm and dry, no obvious skin lesions Psych: normal mood and behavior   Assessment/Plan Right middle lobe pulmonary nodule with low-grade SUV activity and slight growth, wall thickening over 33-month period of time. Former smoker Plan We have reviewed your most recent CT Chest. The nodule in the right middle lobe is stable, but the wall has become thicker over time. We discussed watchful waiting vs biopsy now for definitive diagnosis. You would prefer to move forward with biopsy now. I have placed an order for a bronchoscopy with biopsies.  We have discussed the procedure in detail.  We have reviewed the risks and benefits of the procedure. These include bleeding, infection, puncture of the lung, and  adverse reaction to anesthesia. You have agreed to proceed with biopsy to evaluate the right middle lobe nodule. Your procedure will be done by Dr. Racheal Buddle. You will receive a letter today with date time and information pertaining to the procedure. You will need someone to drive you to the procedure, stay with you during the procedure, and stay with you after the procedure. You will also need someone to stay with you for 24 hours after anesthesia to ensure you have cleared and are doing well. You will need to hold aspirin  the day before and day of the procedure. You will follow-up with me 1 week after the procedure to review the results and to ensure you are doing well. Call if you need us  prior to the procedure or if you have any questions at all. Please contact office for sooner follow up if symptoms do not improve or worsen or seek emergency care      I spent 30 minutes dedicated to the care of this patient on the date of this encounter to include pre-visit review of records, face-to-face time with the patient discussing conditions above, post visit ordering of testing, clinical documentation with the electronic health record, making appropriate referrals as documented, and communicating necessary information to the patient's healthcare team.    Raejean Bullock, NP 01/10/2024  11:43 AM

## 2024-01-10 NOTE — Patient Instructions (Addendum)
 It is good to see you today. We have reviewed your most recent CT Chest. The nodule in the right middle lobe is stable, but the wall has become thicker over time. We discussed watchful waiting vs biopsy now for definitive diagnosis. You would prefer to move forward with biopsy now. I have placed an order for a bronchoscopy with biopsies.  We have discussed the procedure in detail.  We have reviewed the risks and benefits of the procedure. These include bleeding, infection, puncture of the lung, and adverse reaction to anesthesia. You have agreed to proceed with biopsy to evaluate the right middle lobe nodule. Your procedure will be done by Dr. Racheal Buddle. You will receive a letter today with date time and information pertaining to the procedure. You will need someone to drive you to the procedure, stay with you during the procedure, and stay with you after the procedure. You will also need someone to stay with you for 24 hours after anesthesia to ensure you have cleared and are doing well. You will need to hold aspirin  the day before and day of the procedure. You will follow-up with me 1 week after the procedure to review the results and to ensure you are doing well. Call if you need us  prior to the procedure or if you have any questions at all. Please contact office for sooner follow up if symptoms do not improve or worsen or seek emergency care

## 2024-01-10 NOTE — Progress Notes (Signed)
 History of Present Illness Kim Singh is a 83 y.o. female former smoker with a history of breast cancer in 2000, and thyroid  cancer in 2009. She was referred by Dr. Arlene Ben 10/2023 for a right middle lung mass. She will be followed by Dr. Baldwin Levee   Patient has a 41-pack-year smoking history.  She quit smoking August 02, 1997.  PMH Past Medical History - History of flu A - History of cough - History of pneumonia - History of breast cancer - History of thyroid  disease - History of thoracic aneurysm   Synopsis Pt. Referred for evaluation of a  right middle lobe pulmonary nodule that was initially identified on a CTA Chest done 08/22/2023 after a severe bout of influenza A and pneumonia  resulting in a prolonged recovery period.  This showed  a persistent spiculated centrally cavitary nodule/mass within the right middle lobe measuring 3.5 x 3.1 x 2.0 cm  with associated ill-defined adjacent ground-glass. PET scan was ordered as follow up  in February 2025. The scan revealed a mildly hypermetabolic cavitary nodule measuring 3.5 cm, with no evidence of distant metastasis. The nodule has remained stable in size since a previous scan in March 2023, It has an SUV of  3.1 on PET.. Radiology note relative size and morphology show stability back to 10/03/2021, chronic atypical infection is favored over indolent neoplasm such as adenocarcinoma.    I discussed the Ct Chest and PET results with the patient and her daughter.  It has been about 3 months since the last CT scan, and about 2 months since the PET scan.  We decided that the best option was to get a current CT of the chest and based on results determine next best step in regard to plan of care.  Our options are to continue CT surveillance of the nodule versus going ahead and biopsying the nodule to determine definitive diagnosis.  Patient is 83 years old, she is in good shape, and based on results of the CT scan ordered today she will determine what  she feels is her best option.    Her past medical history includes recurrent respiratory infections, breast cancer treated with lumpectomy, chemotherapy, and radiation, and thyroid  cancer treated with thyroidectomy. She also has a thoracic aneurysm under monitoring. She reports intentional weight loss and is satisfied with her current weight management efforts.      Pt. Has consented to use of [employer] soft wear to help capture the content of this OV.   01/10/2024 Patient presents for follow-up with her daughter.  We have been following a cavitary mass in the right middle lobe.  On PET scan in January 2025 the right middle lobe showed an SUV max of 3.1.  At that time we decided to wait 3 months and reevaluate to see if there has been any growth.  CT scan done May 2025 has been reviewed by the 3 of us  today.  While the nodule appears to be similar in size, there is thickening of the wall of the mass.  Recommendation by radiology was to go ahead and move on with tissue diagnosis.  Especially since this did show low metabolic activity on PET.  The patient, her daughter and I had a long discussion regarding we could continue to watch this every 3 months with CT scans, or we could go ahead and biopsy it to determine whether or not it is cancer.  While patient is 83 years old she is in excellent health. Both the  patient and her daughter agree with moving forward with bronchoscopy with biopsies for definitive tissue diagnosis.  I actually agree with this decision.  I have reviewed the risks to include bleeding, infection, pneumothorax, and adverse reaction to anesthesia.  Patient and her daughter asked questions and are in agreement with moving forward with navigational bronchoscopy with biopsies.  Test Results: CT Chest 12/05/2023 3.7 x 2.2 cm cavitary mass in the right middle lobe with pleural stranding and surrounding ground-glass haziness. Its overall size has not changed over the serial studies but  its wall has become thicker over time. Given the SUV max of 3.1 over the solid portion on PET-CT, this is somewhat concerning for a slow growing adenocarcinoma. Tissue sampling is recommended. 2. Additional stable small lung nodules. 3. Emphysema and bronchitis. 4. Aortic and coronary artery atherosclerosis. 5. Stable 4.1 cm ascending aortic aneurysm. Annual CTA or MRA follow-up recommended. 6. Osteopenia and degenerative change. 7. Postsurgical changes in the left breast. There are no mammograms listed in our system since 2018. Consider follow-up screening mammography unless it was done recently elsewhere.   PET Scan 09/13/2023 No areas of abnormal hypermetabolism.   Incidental CT findings: No cervical adenopathy.   CHEST: No thoracic nodal hypermetabolism. The centrally cavitary right middle lobe lung mass measures 3.5 x 2.2 cm and a S.U.V. max of 3.1 on 83/4. This is similar in size to the 08/22/2023 CT. 3.2 x 2.3 cm on 10/03/2021 (when remeasured).   Incidental CT findings: Centrilobular emphysema. Right lower lobe volume loss adjacent to an elevated right hemidiaphragm. Mildly hypermetabolic cavitary right middle lobe lung mass. Given relative size and morphology stability back to 10/03/2021, chronic atypical infection is favored over indolent neoplasm such as adenocarcinoma. Consider tissue sampling. No hypermetabolic nodal or distant metastasis.      Latest Ref Rng & Units 09/18/2023    6:07 PM 08/19/2023   10:07 AM 06/18/2022    4:00 PM  CBC  WBC 4.0 - 10.5 K/uL 8.0  6.1  7.0   Hemoglobin 12.0 - 15.0 g/dL 16.1  09.6  04.5   Hematocrit 36.0 - 46.0 % 42.8  42.1  45.3   Platelets 150 - 400 K/uL 147  196.0  208.0        Latest Ref Rng & Units 09/18/2023    6:07 PM 08/19/2023   10:07 AM 06/18/2022    4:00 PM  BMP  Glucose 70 - 99 mg/dL 409  811  914   BUN 8 - 23 mg/dL 26  12  20    Creatinine 0.44 - 1.00 mg/dL 7.82  9.56  2.13   Sodium 135 - 145 mmol/L 137  139  140    Potassium 3.5 - 5.1 mmol/L 3.3  4.3  4.1   Chloride 98 - 111 mmol/L 95  98  97   CO2 22 - 32 mmol/L 29  33  36   Calcium  8.9 - 10.3 mg/dL 9.2  9.5  08.6     BNP    Component Value Date/Time   BNP 100.0 10/02/2021 0948    ProBNP No results found for: PROBNP  PFT No results found for: FEV1PRE, FEV1POST, FVCPRE, FVCPOST, TLC, DLCOUNC, PREFEV1FVCRT, PSTFEV1FVCRT  No results found.   Past medical hx Past Medical History:  Diagnosis Date   Breast cancer (HCC) 2001   Left   Diabetes mellitus type 2, uncomplicated (HCC)    Personal history of chemotherapy    Personal history of radiation therapy    Pure hypercholesterolemia  Unspecified disorder of thyroid     Unspecified essential hypertension      Social History   Tobacco Use   Smoking status: Former    Current packs/day: 0.00    Average packs/day: 1 pack/day for 41.0 years (41.0 ttl pk-yrs)    Types: Cigarettes    Start date: 08/02/1956    Quit date: 08/02/1997    Years since quitting: 26.4   Smokeless tobacco: Never  Vaping Use   Vaping status: Never Used  Substance Use Topics   Alcohol use: Yes    Alcohol/week: 7.0 standard drinks of alcohol    Types: 7 Glasses of wine per week    Comment: glass of wine every day   Drug use: No    Ms.Tucci reports that she quit smoking about 26 years ago. Her smoking use included cigarettes. She started smoking about 67 years ago. She has a 41 pack-year smoking history. She has never used smokeless tobacco. She reports current alcohol use of about 7.0 standard drinks of alcohol per week. She reports that she does not use drugs.  Tobacco Cessation: Patient is a former smoker quit in 1999   Past surgical hx, Family hx, Social hx all reviewed.  Current Outpatient Medications on File Prior to Visit  Medication Sig   albuterol  (VENTOLIN  HFA) 108 (90 Base) MCG/ACT inhaler Inhale 2 puffs into the lungs every 6 (six) hours as needed for wheezing or shortness of  breath.   aspirin  EC 81 MG tablet Take 1 tablet (81 mg total) by mouth daily. Swallow whole.   busPIRone  (BUSPAR ) 5 MG tablet Take 1 tablet (5 mg total) by mouth 2 (two) times daily.   CALCIUM -VITAMIN D PO Take 1 tablet by mouth 2 (two) times daily.    cetirizine  (ZYRTEC ) 5 MG tablet TAKE 1 TABLET DAILY   escitalopram  (LEXAPRO ) 10 MG tablet Take 1 tablet (10 mg total) by mouth daily.   Multiple Vitamin (MULTIVITAMIN) tablet Take 1 tablet by mouth daily.   nadolol  (CORGARD ) 20 MG tablet Take 1 tablet (20 mg total) by mouth daily.   nitroGLYCERIN  (NITROSTAT ) 0.4 MG SL tablet Place 1 tablet (0.4 mg total) under the tongue every 5 (five) minutes as needed for chest pain.   Omega-3-acid Ethyl Esters (OMACOR PO) Take 1 tablet by mouth 2 (two) times daily.    rosuvastatin  (CRESTOR ) 10 MG tablet TAKE 1 TABLET DAILY   Spacer/Aero-Hold Chamber Mask MISC Spacer for inhaler   Spacer/Aero-Holding Chambers (AEROCHAMBER PLUS) Device Please give one spacer   SYNTHROID  100 MCG tablet TAKE 1 TABLET DAILY   vitamin B-12 (CYANOCOBALAMIN ) 1000 MCG tablet Take 1,000 mcg by mouth once a week. Takes on Mondays   No current facility-administered medications on file prior to visit.     Allergies  Allergen Reactions   Codeine Other (See Comments)    GI upset    Review Of Systems:  Constitutional:   No  weight loss, night sweats,  Fevers, chills, fatigue, or  lassitude.  HEENT:   No headaches,  Difficulty swallowing,  Tooth/dental problems, or  Sore throat,                No sneezing, itching, ear ache, nasal congestion, post nasal drip,   CV:  No chest pain,  Orthopnea, PND, swelling in lower extremities, anasarca, dizziness, palpitations, syncope.   GI  No heartburn, indigestion, abdominal pain, nausea, vomiting, diarrhea, change in bowel habits, loss of appetite, bloody stools.   Resp: No shortness of breath with exertion  or at rest.  No excess mucus, no productive cough,  No non-productive cough,  No  coughing up of blood.  No change in color of mucus.  No wheezing.  No chest wall deformity  Skin: no rash or lesions.  GU: no dysuria, change in color of urine, no urgency or frequency.  No flank pain, no hematuria   MS:  No joint pain or swelling.  No decreased range of motion.  No back pain.  Psych:  No change in mood or affect. No depression or anxiety.  No memory loss.   Vital Signs BP (!) 133/90 (BP Location: Right Arm, Patient Position: Sitting, Cuff Size: Normal)   Pulse (!) 59   Ht 5' 8 (1.727 m)   Wt 160 lb 6.4 oz (72.8 kg)   SpO2 93%   BMI 24.39 kg/m    Physical Exam:  General- No distress,  A&Ox3, pleasant elderly female ENT: No sinus tenderness, TM clear, pale nasal mucosa, no oral exudate,no post nasal drip, no LAN Cardiac: S1, S2, regular rate and rhythm, no murmur Chest: No wheeze/ rales/ dullness; no accessory muscle use, no nasal flaring, no sternal retractions, slightly diminished per bases bilaterally Abd.: Soft Non-tender, nondistended, bowel sounds positive,Body mass index is 24.39 kg/m.  Ext: No clubbing cyanosis, edema, no obvious deformities Neuro:  normal strength, moving all extremities x 4, alert and oriented x 3, appropriate Skin: No rashes, warm and dry, no obvious skin lesions Psych: normal mood and behavior   Assessment/Plan Right middle lobe pulmonary nodule with low-grade SUV activity and slight growth, wall thickening over 33-month period of time. Former smoker Plan We have reviewed your most recent CT Chest. The nodule in the right middle lobe is stable, but the wall has become thicker over time. We discussed watchful waiting vs biopsy now for definitive diagnosis. You would prefer to move forward with biopsy now. I have placed an order for a bronchoscopy with biopsies.  We have discussed the procedure in detail.  We have reviewed the risks and benefits of the procedure. These include bleeding, infection, puncture of the lung, and  adverse reaction to anesthesia. You have agreed to proceed with biopsy to evaluate the right middle lobe nodule. Your procedure will be done by Dr. Racheal Buddle. You will receive a letter today with date time and information pertaining to the procedure. You will need someone to drive you to the procedure, stay with you during the procedure, and stay with you after the procedure. You will also need someone to stay with you for 24 hours after anesthesia to ensure you have cleared and are doing well. You will need to hold aspirin  the day before and day of the procedure. You will follow-up with me 1 week after the procedure to review the results and to ensure you are doing well. Call if you need us  prior to the procedure or if you have any questions at all. Please contact office for sooner follow up if symptoms do not improve or worsen or seek emergency care      I spent 30 minutes dedicated to the care of this patient on the date of this encounter to include pre-visit review of records, face-to-face time with the patient discussing conditions above, post visit ordering of testing, clinical documentation with the electronic health record, making appropriate referrals as documented, and communicating necessary information to the patient's healthcare team.    Raejean Bullock, NP 01/10/2024  11:43 AM

## 2024-01-10 NOTE — Telephone Encounter (Signed)
 Please schedule the following:  Provider performing procedure: Byrum Diagnosis: Lung nodule Which side if for nodule / mass?  Right middle Procedure:  navigational bronchoscopy   Has patient been spoken to by Provider and given informed consent? Yes Anesthesia:  General Do you need Fluro?  Yes Duration of procedure: 1.5 hours Date: 01/27/2024 Alternate Date: 01/28/2024  Time: Mid morning Location: [facility] Does patient have OSA?  DM Yes Or Latex allergy? No Medication Restriction/ Anticoagulate/Antiplatelet: Hold ASA 1 day prior to and day of procedure Pre-op Labs Ordered:determined by Anesthesia Imaging request:  Recent CT chest  (If, SuperDimension CT Chest, please have STAT courier sent to ENDO)

## 2024-01-22 ENCOUNTER — Other Ambulatory Visit: Payer: Self-pay

## 2024-01-22 ENCOUNTER — Encounter (HOSPITAL_COMMUNITY): Payer: Self-pay | Admitting: Emergency Medicine

## 2024-01-22 NOTE — Progress Notes (Signed)
 SDW CALL  Patient was given pre-op instructions over the phone. The opportunity was given for the patient to ask questions. No further questions asked. Patient verbalized understanding of instructions given.   PCP - Katrinka Garnette KIDD, MD Cardiologist - Elmira Newman PARAS, MD   PPM/ICD - denies Device Orders -  Rep Notified -   Chest x-ray - CT-12/05/23 EKG - 10/24/23 Stress Test - 11/27/23 ECHO - 11/25/23 Cardiac Cath - denies  Sleep Study - denies CPAP -   Fasting Blood Sugar - pt does not check blood sugar at home and does not take any diabetes medicine. Last A1C checked at PCP office 08/19/23 was 6.9. Checks Blood Sugar _____ times a day  Blood Thinner Instructions:no Aspirin  Instructions:per Dr. Shelah, hold Aspirin , one day prior to procedure and day of procedure.   ERAS Protcol -no PRE-SURGERY Ensure or G2-   COVID TEST- na   Anesthesia review: no  Patient denies shortness of breath, fever, cough and chest pain over the phone call   As of today, STOP taking any Aleve, Naproxen, Ibuprofen, Motrin, Advil, Goody's, BC's, all herbal medications, fish oil, and all vitamins.  Special instructions:    Oral Hygiene is also important to reduce your risk of infection.  Remember - BRUSH YOUR TEETH THE MORNING OF SURGERY WITH YOUR REGULAR TOOTHPASTE

## 2024-01-27 ENCOUNTER — Ambulatory Visit (HOSPITAL_COMMUNITY)

## 2024-01-27 ENCOUNTER — Ambulatory Visit (HOSPITAL_COMMUNITY): Admitting: Anesthesiology

## 2024-01-27 ENCOUNTER — Ambulatory Visit (HOSPITAL_COMMUNITY)
Admission: RE | Admit: 2024-01-27 | Discharge: 2024-01-27 | Disposition: A | Discharge status: Home / Self Care | Attending: Emergency Medicine | Admitting: Emergency Medicine

## 2024-01-27 ENCOUNTER — Other Ambulatory Visit: Payer: Self-pay

## 2024-01-27 ENCOUNTER — Encounter (HOSPITAL_COMMUNITY): Payer: Self-pay | Admitting: Emergency Medicine

## 2024-01-27 ENCOUNTER — Encounter (HOSPITAL_COMMUNITY)
Admission: RE | Disposition: A | Discharge status: Home / Self Care | Payer: Self-pay | Source: Home / Self Care | Attending: Emergency Medicine

## 2024-01-27 DIAGNOSIS — Z87891 Personal history of nicotine dependence: Secondary | ICD-10-CM | POA: Diagnosis not present

## 2024-01-27 DIAGNOSIS — R911 Solitary pulmonary nodule: Secondary | ICD-10-CM

## 2024-01-27 DIAGNOSIS — I7121 Aneurysm of the ascending aorta, without rupture: Secondary | ICD-10-CM | POA: Insufficient documentation

## 2024-01-27 DIAGNOSIS — J432 Centrilobular emphysema: Secondary | ICD-10-CM | POA: Insufficient documentation

## 2024-01-27 DIAGNOSIS — I10 Essential (primary) hypertension: Secondary | ICD-10-CM | POA: Insufficient documentation

## 2024-01-27 DIAGNOSIS — I700 Atherosclerosis of aorta: Secondary | ICD-10-CM | POA: Insufficient documentation

## 2024-01-27 DIAGNOSIS — Z923 Personal history of irradiation: Secondary | ICD-10-CM | POA: Diagnosis not present

## 2024-01-27 DIAGNOSIS — I2510 Atherosclerotic heart disease of native coronary artery without angina pectoris: Secondary | ICD-10-CM

## 2024-01-27 DIAGNOSIS — Z9221 Personal history of antineoplastic chemotherapy: Secondary | ICD-10-CM | POA: Insufficient documentation

## 2024-01-27 DIAGNOSIS — Z85850 Personal history of malignant neoplasm of thyroid: Secondary | ICD-10-CM | POA: Diagnosis not present

## 2024-01-27 DIAGNOSIS — Z853 Personal history of malignant neoplasm of breast: Secondary | ICD-10-CM | POA: Insufficient documentation

## 2024-01-27 DIAGNOSIS — R846 Abnormal cytological findings in specimens from respiratory organs and thorax: Secondary | ICD-10-CM | POA: Diagnosis not present

## 2024-01-27 DIAGNOSIS — E119 Type 2 diabetes mellitus without complications: Secondary | ICD-10-CM | POA: Insufficient documentation

## 2024-01-27 DIAGNOSIS — C342 Malignant neoplasm of middle lobe, bronchus or lung: Secondary | ICD-10-CM | POA: Diagnosis not present

## 2024-01-27 HISTORY — DX: Polyneuropathy, unspecified: G62.9

## 2024-01-27 HISTORY — DX: Hypothyroidism, unspecified: E03.9

## 2024-01-27 HISTORY — DX: Malignant neoplasm of thyroid gland: C73

## 2024-01-27 LAB — CBC
HCT: 43.7 % (ref 36.0–46.0)
Hemoglobin: 14.6 g/dL (ref 12.0–15.0)
MCH: 32.2 pg (ref 26.0–34.0)
MCHC: 33.4 g/dL (ref 30.0–36.0)
MCV: 96.3 fL (ref 80.0–100.0)
Platelets: 172 K/uL (ref 150–400)
RBC: 4.54 MIL/uL (ref 3.87–5.11)
RDW: 13.2 % (ref 11.5–15.5)
WBC: 5.8 K/uL (ref 4.0–10.5)
nRBC: 0 % (ref 0.0–0.2)

## 2024-01-27 LAB — GLUCOSE, CAPILLARY
Glucose-Capillary: 121 mg/dL — ABNORMAL HIGH (ref 70–99)
Glucose-Capillary: 124 mg/dL — ABNORMAL HIGH (ref 70–99)

## 2024-01-27 LAB — BASIC METABOLIC PANEL WITH GFR
Anion gap: 11 (ref 5–15)
BUN: 14 mg/dL (ref 8–23)
CO2: 26 mmol/L (ref 22–32)
Calcium: 9.2 mg/dL (ref 8.9–10.3)
Chloride: 102 mmol/L (ref 98–111)
Creatinine, Ser: 0.83 mg/dL (ref 0.44–1.00)
GFR, Estimated: >60 mL/min (ref >60)
Glucose, Bld: 118 mg/dL — ABNORMAL HIGH (ref 70–99)
Potassium: 3.5 mmol/L (ref 3.5–5.1)
Sodium: 139 mmol/L (ref 135–145)

## 2024-01-27 SURGERY — VIDEO BRONCHOSCOPY WITH ENDOBRONCHIAL NAVIGATION
Anesthesia: General | Laterality: Right

## 2024-01-27 MED ORDER — DEXAMETHASONE SODIUM PHOSPHATE 10 MG/ML IJ SOLN
INTRAMUSCULAR | Status: DC | PRN
  Administered 2024-01-27: 10 mg via INTRAVENOUS

## 2024-01-27 MED ORDER — PROPOFOL 10 MG/ML IV BOLUS
INTRAVENOUS | Status: DC | PRN
  Administered 2024-01-27: 100 mg via INTRAVENOUS

## 2024-01-27 MED ORDER — EPHEDRINE SULFATE-NACL 50-0.9 MG/10ML-% IV SOSY
PREFILLED_SYRINGE | INTRAVENOUS | Status: DC | PRN
  Administered 2024-01-27: 10 mg via INTRAVENOUS
  Administered 2024-01-27 (×2): 5 mg via INTRAVENOUS

## 2024-01-27 MED ORDER — LIDOCAINE 2% (20 MG/ML) 5 ML SYRINGE
INTRAMUSCULAR | Status: DC | PRN
  Administered 2024-01-27: 100 mg via INTRAVENOUS

## 2024-01-27 MED ORDER — PROPOFOL 500 MG/50ML IV EMUL
INTRAVENOUS | Status: DC | PRN
  Administered 2024-01-27: 125 ug/kg/min via INTRAVENOUS

## 2024-01-27 MED ORDER — FENTANYL CITRATE (PF) 100 MCG/2ML IJ SOLN
INTRAMUSCULAR | Status: AC
Start: 2024-01-27 — End: 2024-01-27
  Filled 2024-01-27: qty 2

## 2024-01-27 MED ORDER — SUGAMMADEX SODIUM 200 MG/2ML IV SOLN
INTRAVENOUS | Status: DC | PRN
  Administered 2024-01-27: 200 mg via INTRAVENOUS

## 2024-01-27 MED ORDER — CHLORHEXIDINE GLUCONATE 0.12 % MT SOLN
OROMUCOSAL | Status: AC
  Administered 2024-01-27: 15 mL
  Filled 2024-01-27: qty 15

## 2024-01-27 MED ORDER — PHENYLEPHRINE HCL (PRESSORS) 10 MG/ML IV SOLN
INTRAVENOUS | Status: DC | PRN
  Administered 2024-01-27 (×2): 80 ug via INTRAVENOUS

## 2024-01-27 MED ORDER — ONDANSETRON HCL 4 MG/2ML IJ SOLN: 4.0000 mg | Freq: Once | INTRAMUSCULAR | Status: DC | PRN

## 2024-01-27 MED ORDER — ONDANSETRON HCL 4 MG/2ML IJ SOLN
INTRAMUSCULAR | Status: DC | PRN
  Administered 2024-01-27: 4 mg via INTRAVENOUS

## 2024-01-27 MED ORDER — FENTANYL CITRATE (PF) 100 MCG/2ML IJ SOLN: 25.0000 ug | INTRAMUSCULAR | Status: DC | PRN

## 2024-01-27 MED ORDER — ACETAMINOPHEN 10 MG/ML IV SOLN: 1000.0000 mg | Freq: Once | INTRAVENOUS | Status: DC | PRN

## 2024-01-27 MED ORDER — LACTATED RINGERS IV SOLN: INTRAVENOUS | Status: DC

## 2024-01-27 MED ORDER — ROCURONIUM BROMIDE 10 MG/ML (PF) SYRINGE
PREFILLED_SYRINGE | INTRAVENOUS | Status: DC | PRN
  Administered 2024-01-27: 50 mg via INTRAVENOUS

## 2024-01-27 MED ORDER — FENTANYL CITRATE (PF) 250 MCG/5ML IJ SOLN
INTRAMUSCULAR | Status: DC | PRN
  Administered 2024-01-27 (×2): 50 ug via INTRAVENOUS

## 2024-01-27 MED ORDER — OXYCODONE HCL 5 MG PO TABS: 5.0000 mg | ORAL_TABLET | Freq: Once | ORAL | Status: DC | PRN

## 2024-01-27 MED ORDER — INSULIN ASPART 100 UNIT/ML IJ SOLN: 0.0000 [IU] | INTRAMUSCULAR | Status: DC | PRN

## 2024-01-27 MED ORDER — OXYCODONE HCL 5 MG/5ML PO SOLN: 5.0000 mg | Freq: Once | ORAL | Status: DC | PRN

## 2024-01-27 SURGICAL SUPPLY — 37 items
ADAPTER BRONCHOSCOPE OLYMPUS (ADAPTER) ×2 IMPLANT
ADAPTER VALVE BIOPSY EBUS (MISCELLANEOUS) IMPLANT
BAG COUNTER SPONGE SURGICOUNT (BAG) ×2 IMPLANT
BRUSH CYTOL CELLEBRITY 1.5X140 (MISCELLANEOUS) ×2 IMPLANT
BRUSH SUPERTRAX BIOPSY (INSTRUMENTS) IMPLANT
BRUSH SUPERTRAX NDL-TIP CYTO (INSTRUMENTS) ×2 IMPLANT
CANISTER SUCTION 3000ML PPV (SUCTIONS) ×2 IMPLANT
CNTNR URN SCR LID CUP LEK RST (MISCELLANEOUS) ×2 IMPLANT
COVER BACK TABLE 60X90IN (DRAPES) ×2 IMPLANT
FILTER STRAW FLUID ASPIR (MISCELLANEOUS) IMPLANT
FORCEPS BIOP 1.5 SINGLE USE (MISCELLANEOUS) ×2 IMPLANT
FORCEPS BIOP SUPERTRX PREMAR (INSTRUMENTS) ×2 IMPLANT
GAUZE SPONGE 4X4 12PLY STRL (GAUZE/BANDAGES/DRESSINGS) ×2 IMPLANT
GLOVE BIO SURGEON STRL SZ7.5 (GLOVE) ×4 IMPLANT
GOWN STRL REUS W/ TWL LRG LVL3 (GOWN DISPOSABLE) ×4 IMPLANT
KIT CLEAN ENDO COMPLIANCE (KITS) ×2 IMPLANT
KIT LOCATABLE GUIDE (CANNULA) IMPLANT
KIT MARKER FIDUCIAL DELIVERY (KITS) IMPLANT
KIT TURNOVER KIT B (KITS) ×2 IMPLANT
MARKER SKIN DUAL TIP RULER LAB (MISCELLANEOUS) ×2 IMPLANT
NDL SUPERTRX PREMARK BIOPSY (NEEDLE) ×2 IMPLANT
NEEDLE SUPERTRX PREMARK BIOPSY (NEEDLE) ×2 IMPLANT
NS IRRIG 1000ML POUR BTL (IV SOLUTION) ×2 IMPLANT
OIL SILICONE PENTAX (PARTS (SERVICE/REPAIRS)) ×2 IMPLANT
PAD ARMBOARD POSITIONER FOAM (MISCELLANEOUS) ×4 IMPLANT
PATCHES PATIENT (LABEL) ×6 IMPLANT
SYR 20ML ECCENTRIC (SYRINGE) ×2 IMPLANT
SYR 20ML LL LF (SYRINGE) ×2 IMPLANT
SYR 50ML SLIP (SYRINGE) ×2 IMPLANT
SuperLock Fiducial Marker IMPLANT
TOWEL GREEN STERILE FF (TOWEL DISPOSABLE) ×2 IMPLANT
TRAP SPECIMEN MUCUS 40CC (MISCELLANEOUS) IMPLANT
TUBE CONNECTING 20X1/4 (TUBING) ×2 IMPLANT
UNDERPAD 30X36 HEAVY ABSORB (UNDERPADS AND DIAPERS) ×2 IMPLANT
VALVE BIOPSY SINGLE USE (MISCELLANEOUS) ×2 IMPLANT
VALVE SUCTION BRONCHIO DISP (MISCELLANEOUS) ×2 IMPLANT
WATER STERILE IRR 1000ML POUR (IV SOLUTION) ×2 IMPLANT

## 2024-01-27 NOTE — Op Note (Signed)
 Video Bronchoscopy with Robotic Assisted Bronchoscopic Navigation   Date of Operation: 01/27/2024   Pre-op Diagnosis: Right middle lobe nodule  Post-op Diagnosis: Same  Surgeon: Lamar Chris  Assistants: None  Anesthesia: General endotracheal anesthesia  Operation: Flexible video fiberoptic bronchoscopy with robotic assistance and biopsies.  Estimated Blood Loss: Minimal  Complications: None  Indications and History: Kim Singh is a 83 y.o. female with history of former tobacco use, remote breast cancer, thyroid  cancer.  She was found to have right middle lobe nodule in January 2025 that has enlarged on subsequent imaging.  Recommendation made to achieve a tissue diagnosis via robotic assisted navigational bronchoscopy.  The risks, benefits, complications, treatment options and expected outcomes were discussed with the patient.  The possibilities of pneumothorax, pneumonia, reaction to medication, pulmonary aspiration, perforation of a viscus, bleeding, failure to diagnose a condition and creating a complication requiring transfusion or operation were discussed with the patient who freely signed the consent.    Description of Procedure: The patient was seen in the Preoperative Area, was examined and was deemed appropriate to proceed.  The patient was taken to [facility] room 3, identified as Kim Singh and the procedure verified as Flexible Video Fiberoptic Bronchoscopy.  A Time Out was held and the above information confirmed.   Prior to the date of the procedure a high-resolution CT scan of the chest was performed. Utilizing ION software program a virtual tracheobronchial tree was generated to allow the creation of distinct navigation pathways to the patient's parenchymal abnormalities. After being taken to the operating room general anesthesia was initiated and the patient  was orally intubated. The video fiberoptic bronchoscope was introduced via the endotracheal tube and a  general inspection was performed which showed normal right and left lung anatomy. Aspiration of the bilateral mainstems was completed to remove any remaining secretions. Robotic catheter inserted into patient's endotracheal tube.   Target #1 right middle lobe nodule: The distinct navigation pathways prepared prior to this procedure were then utilized to navigate to patient's lesion identified on CT scan. The robotic catheter was secured into place and the vision probe was withdrawn.  Lesion location was approximated using fluoroscopy.  Local registration and targeting was performed using Siem[employer] Cios mobile C-arm three-dimensional imaging. Under fluoroscopic guidance transbronchial needle brushings, transbronchial needle biopsies, and transbronchial forceps biopsies were performed to be sent for cytology and pathology.  Needle-in-lesion was confirmed using Cios mobile C-arm.  A bronchioalveolar lavage was performed in the right middle lobe and sent for microbiology.  Under fluoroscopic guidance a single fiducial marker was placed adjacent to the nodule.    At the end of the procedure a general airway inspection was performed and there was no evidence of active bleeding. The bronchoscope was removed.  The patient tolerated the procedure well. There was no significant blood loss and there were no obvious complications. A post-procedural chest x-ray is pending.  Samples Target #1: 1. Transbronchial needle brushings from right middle lobe nodule 2. Transbronchial Wang needle biopsies from right middle lobe nodule 3. Transbronchial forceps biopsies from right middle lobe nodule 4. Bronchoalveolar lavage from right middle lobe     Plans:  The patient will be discharged from the PACU to home when recovered from anesthesia and after chest x-ray is reviewed. We will review the cytology, pathology and microbiology results with the patient when they become available. Outpatient followup will be  with CANDIE Lites, NP and Dr Chris.   Lamar Chris, MD, PhD 01/27/2024, 10:01 AM  [facility] [phone] or if no answer before 7:00PM call [phone] For any issues after 7:00PM please call eLink [phone]

## 2024-01-27 NOTE — Anesthesia Postprocedure Evaluation (Signed)
 Anesthesia Post Note  Patient: Kim Singh  Procedure(s) Performed: VIDEO BRONCHOSCOPY WITH ENDOBRONCHIAL NAVIGATION (Right) BRONCHOSCOPY, WITH BRUSH BIOPSY BRONCHOSCOPY, WITH NEEDLE ASPIRATION BIOPSY BRONCHOSCOPY, WITH BIOPSY IRRIGATION, BRONCHUS INSERTION, FIDUCIAL MARKERS     Patient location during evaluation: PACU Anesthesia Type: General Level of consciousness: awake and alert Pain management: pain level controlled Vital Signs Assessment: post-procedure vital signs reviewed and stable Respiratory status: spontaneous breathing, nonlabored ventilation, respiratory function stable and patient connected to nasal cannula oxygen  Cardiovascular status: blood pressure returned to baseline and stable Postop Assessment: no apparent nausea or vomiting Anesthetic complications: no   No notable events documented.  Last Vitals:  Vitals:   01/27/24 1030 01/27/24 1040  BP: 120/74 134/71  Pulse: (!) 56 (!) 57  Resp: 13 12  Temp:  (!) 36.3 C  SpO2: 93% 94%    Last Pain:  Vitals:   01/27/24 1040  TempSrc:   PainSc: 0-No pain                 Lynwood MARLA Cornea

## 2024-01-27 NOTE — Discharge Instructions (Addendum)

## 2024-01-27 NOTE — Anesthesia Preprocedure Evaluation (Addendum)
 Anesthesia Evaluation  Patient identified by MRN, date of birth, ID band Patient awake    Reviewed: Allergy & Precautions, NPO status , Patient's Chart, lab work & pertinent test results, reviewed documented beta blocker date and time   Airway Mallampati: II  TM Distance: >3 FB     Dental  (+) Partial Upper   Pulmonary COPD, former smoker   breath sounds clear to auscultation       Cardiovascular hypertension, + CAD  (-) Cardiac Stents and (-) CABG  Rhythm:Regular Rate:Normal  Recent low risk stress test (with max HR 73)  IMPRESSIONS     1. Left ventricular ejection fraction, by estimation, is 65 to 70%. Left  ventricular ejection fraction by 3D volume is 69 %. The left ventricle has  normal function. The left ventricle has no regional wall motion  abnormalities. Left ventricular diastolic   parameters are consistent with Grade II diastolic dysfunction  (pseudonormalization). The average left ventricular global longitudinal  strain is -24.1 %. The global longitudinal strain is normal.   2. Right ventricular systolic function is normal. The right ventricular  size is normal. There is normal pulmonary artery systolic pressure.   3. The mitral valve is normal in structure. Trivial mitral valve  regurgitation. No evidence of mitral stenosis.   4. The aortic valve is tricuspid. Aortic valve regurgitation is not  visualized. No aortic stenosis is present.   5. The inferior vena cava is normal in size with greater than 50%  respiratory variability, suggesting right atrial pressure of 3 mmHg.     Neuro/Psych  PSYCHIATRIC DISORDERS  Depression       GI/Hepatic   Endo/Other  diabetesHypothyroidism    Renal/GU      Musculoskeletal   Abdominal   Peds  Hematology   Anesthesia Other Findings   Reproductive/Obstetrics                              Anesthesia Physical Anesthesia Plan  ASA:  3  Anesthesia Plan: General   Post-op Pain Management:    Induction: Intravenous  PONV Risk Score and Plan: 2 and Ondansetron  and Dexamethasone   Airway Management Planned: Oral ETT  Additional Equipment:   Intra-op Plan:   Post-operative Plan: Extubation in OR  Informed Consent: I have reviewed the patients History and Physical, chart, labs and discussed the procedure including the risks, benefits and alternatives for the proposed anesthesia with the patient or authorized representative who has indicated his/her understanding and acceptance.     Dental advisory given  Plan Discussed with: CRNA  Anesthesia Plan Comments:          Anesthesia Quick Evaluation

## 2024-01-27 NOTE — Anesthesia Procedure Notes (Signed)
 Procedure Name: Intubation Date/Time: 01/27/2024 9:09 AM  Performed by: Shlomo Tinnie SAILOR, RNPre-anesthesia Checklist: Patient identified, Emergency Drugs available, Suction available and Patient being monitored Patient Re-evaluated:Patient Re-evaluated prior to induction Oxygen  Delivery Method: Circle System Utilized Preoxygenation: Pre-oxygenation with 100% oxygen  Induction Type: IV induction Ventilation: Mask ventilation without difficulty Laryngoscope Size: Mac and 3 Grade View: Grade I Tube type: Oral Tube size: 8.5 mm Number of attempts: 1 Airway Equipment and Method: Stylet and Oral airway Placement Confirmation: ETT inserted through vocal cords under direct vision, positive ETCO2 and breath sounds checked- equal and bilateral Secured at: 23 cm Tube secured with: Tape Dental Injury: Teeth and Oropharynx as per pre-operative assessment

## 2024-01-27 NOTE — Interval H&P Note (Signed)
 History and Physical Interval Note:  01/27/2024 7:45 AM  Kim Singh  has presented today for surgery, with the diagnosis of lung nodule.  The various methods of treatment have been discussed with the patient and family. After consideration of risks, benefits and other options for treatment, the patient has consented to  Procedure(s): VIDEO BRONCHOSCOPY WITH ENDOBRONCHIAL NAVIGATION (Right) as a surgical intervention.  The patient's history has been reviewed, patient examined, no change in status, stable for surgery.  I have reviewed the patient's chart and labs.  Questions were answered to the patient's satisfaction.     Lamar GORMAN Chris

## 2024-01-27 NOTE — Transfer of Care (Signed)
 Immediate Anesthesia Transfer of Care Note  Patient: Kim Singh  Procedure(s) Performed: VIDEO BRONCHOSCOPY WITH ENDOBRONCHIAL NAVIGATION (Right) BRONCHOSCOPY, WITH BRUSH BIOPSY BRONCHOSCOPY, WITH NEEDLE ASPIRATION BIOPSY BRONCHOSCOPY, WITH BIOPSY IRRIGATION, BRONCHUS INSERTION, FIDUCIAL MARKERS  Patient Location: PACU  Anesthesia Type:General  Level of Consciousness: awake, alert , and oriented  Airway & Oxygen  Therapy: Patient Spontanous Breathing and Patient connected to face mask oxygen   Post-op Assessment: Report given to RN and Post -op Vital signs reviewed and stable  Post vital signs: Reviewed and stable  Last Vitals:  Vitals Value Taken Time  BP 131/63 01/27/24 10:09  Temp 36.4 C 01/27/24 10:09  Pulse 56 01/27/24 10:13  Resp 11 01/27/24 10:13  SpO2 99 % 01/27/24 10:13  Vitals shown include unfiled device data.  Last Pain:  Vitals:   01/27/24 0705  TempSrc:   PainSc: 0-No pain         Complications: No notable events documented.

## 2024-01-28 ENCOUNTER — Encounter (HOSPITAL_COMMUNITY): Payer: Self-pay | Admitting: Emergency Medicine

## 2024-01-29 LAB — ACID FAST SMEAR (AFB, MYCOBACTERIA): Acid Fast Smear: NEGATIVE

## 2024-01-29 LAB — CULTURE, BAL-QUANTITATIVE W GRAM STAIN
Culture: NO GROWTH
Gram Stain: NONE SEEN

## 2024-01-29 LAB — CYTOLOGY - NON PAP

## 2024-02-01 LAB — AEROBIC/ANAEROBIC CULTURE W GRAM STAIN (SURGICAL/DEEP WOUND)
Culture: NO GROWTH
Gram Stain: NONE SEEN

## 2024-02-03 ENCOUNTER — Ambulatory Visit (INDEPENDENT_AMBULATORY_CARE_PROVIDER_SITE_OTHER): Admitting: Acute Care

## 2024-02-03 ENCOUNTER — Encounter: Payer: Self-pay | Admitting: Acute Care

## 2024-02-03 ENCOUNTER — Telehealth: Payer: Self-pay | Admitting: Radiation Oncology

## 2024-02-03 VITALS — BP 119/73 | HR 61 | Temp 98.3°F | Ht 68.0 in | Wt 161.2 lb

## 2024-02-03 DIAGNOSIS — Z853 Personal history of malignant neoplasm of breast: Secondary | ICD-10-CM | POA: Diagnosis not present

## 2024-02-03 DIAGNOSIS — Z85850 Personal history of malignant neoplasm of thyroid: Secondary | ICD-10-CM

## 2024-02-03 DIAGNOSIS — Z9889 Other specified postprocedural states: Secondary | ICD-10-CM | POA: Diagnosis not present

## 2024-02-03 DIAGNOSIS — C342 Malignant neoplasm of middle lobe, bronchus or lung: Secondary | ICD-10-CM

## 2024-02-03 NOTE — Patient Instructions (Signed)
 It is good to see you today. I am glad you have done well after your bronchoscopy with biopsies. We have reviewed the results together and it showed that you have a non-small cell lung cancer called adenocarcinoma in the right middle lobe. I have referred you to radiation oncology for treatment. You should get a phone call today or tomorrow to set up your first consultation appointment. They will take great care of you at the cancer center here in [place]. Please call if you have any questions or concerns. It has been a pleasure being part of your care team.  Call us  if you need us .  Please contact office for sooner follow up if symptoms do not improve or worsen or seek emergency care

## 2024-02-03 NOTE — Telephone Encounter (Signed)
 Spoke to pt to schedule consult with Dr. Izell at the request of Dr. Shelah. Pt expressed positive outlook after reviewing results with Dr. Lanny team. She advised she previously had tx ~2001 for Breast cancer in [place]  but she does not have any of those records anymore. I advised I would pass this information to Dr. Izell in case this information was needed in the future.

## 2024-02-03 NOTE — Progress Notes (Signed)
 History of Present Illness Kim Singh is a 83 y.o. female former smoker with a history of breast cancer in 2000, and thyroid  cancer in 2009. She was referred by Dr. Katrinka 10/2023 for a right middle lung mass. She will be followed by Dr. Shelah   Patient has a 41-pack-year smoking history.  She quit smoking August 02, 1997.   PMH Past Medical History - History of flu A - History of cough - History of pneumonia - History of breast cancer - History of thyroid  disease - History of thoracic aneurysm   Synopsis Pt. Referred for evaluation of a  right middle lobe pulmonary nodule that was initially identified on a CTA Chest done 08/22/2023 after a severe bout of influenza A and pneumonia  resulting in a prolonged recovery period.  This showed  a persistent spiculated centrally cavitary nodule/mass within the right middle lobe measuring 3.5 x 3.1 x 2.0 cm  with associated ill-defined adjacent ground-glass. PET scan was ordered as follow up  in February 2025. The scan revealed a mildly hypermetabolic cavitary nodule measuring 3.5 cm, with no evidence of distant metastasis. The nodule has remained stable in size since a previous scan in March 2023, It has an SUV of  3.1 on PET.. Radiology note relative size and morphology show stability back to 10/03/2021, chronic atypical infection is favored over indolent neoplasm such as adenocarcinoma.    Pt. And daughter agreed for the patient to undergo bronchoscopy with biopsies , which she had done 01/27/2024. She is here today to review the results.    02/03/2024 Pt. Presents for follow up after bronchoscopy with biopsies done 01/27/2024. She states she has done well after her bronchoscopy with biopsies . No bleeding or fever, no discolored secretions.She did not have any adverse reaction to anesthesia.  We have discussed her biopsy results.  Test Results: Cytology 01/27/2024 A. LUNG, RML, FINE NEEDLE ASPIRATION:  - Adenocarcinoma, see comment   B.  LUNG, RML, BRUSHING:  - Atypical cells present   CT Chest 12/05/2023 3.7 x 2.2 cm cavitary mass in the right middle lobe with pleural stranding and surrounding ground-glass haziness. Its overall size has not changed over the serial studies but its wall has become thicker over time. Given the SUV max of 3.1 over the solid portion on PET-CT, this is somewhat concerning for a slow growing adenocarcinoma. Tissue sampling is recommended. 2. Additional stable small lung nodules. 3. Emphysema and bronchitis. 4. Aortic and coronary artery atherosclerosis. 5. Stable 4.1 cm ascending aortic aneurysm. Annual CTA or MRA follow-up recommended. 6. Osteopenia and degenerative change. 7. Postsurgical changes in the left breast. There are no mammograms listed in our system since 2018. Consider follow-up screening mammography unless it was done recently elsewhere.     PET Scan 09/13/2023 No areas of abnormal hypermetabolism.   Incidental CT findings: No cervical adenopathy.   CHEST: No thoracic nodal hypermetabolism. The centrally cavitary right middle lobe lung mass measures 3.5 x 2.2 cm and a S.U.V. max of 3.1 on 83/4. This is similar in size to the 08/22/2023 CT. 3.2 x 2.3 cm on 10/03/2021 (when remeasured).   Incidental CT findings: Centrilobular emphysema. Right lower lobe volume loss adjacent to an elevated right hemidiaphragm. Mildly hypermetabolic cavitary right middle lobe lung mass. Given relative size and morphology stability back to 10/03/2021, chronic atypical infection is favored over indolent neoplasm such as adenocarcinoma. Consider tissue sampling. No hypermetabolic nodal or distant metastasis.    Latest Ref Rng & Units 01/27/2024  6:50 AM 09/18/2023    6:07 PM 08/19/2023   10:07 AM  CBC  WBC 4.0 - 10.5 K/uL 5.8  8.0  6.1   Hemoglobin 12.0 - 15.0 g/dL 85.3  85.7  85.8   Hematocrit 36.0 - 46.0 % 43.7  42.8  42.1   Platelets 150 - 400 K/uL 172  147  196.0        Latest Ref  Rng & Units 01/27/2024    6:50 AM 09/18/2023    6:07 PM 08/19/2023   10:07 AM  BMP  Glucose 70 - 99 mg/dL 881  855  848   BUN 8 - 23 mg/dL 14  26  12    Creatinine 0.44 - 1.00 mg/dL 9.16  9.07  9.26   Sodium 135 - 145 mmol/L 139  137  139   Potassium 3.5 - 5.1 mmol/L 3.5  3.3  4.3   Chloride 98 - 111 mmol/L 102  95  98   CO2 22 - 32 mmol/L 26  29  33   Calcium  8.9 - 10.3 mg/dL 9.2  9.2  9.5     BNP    Component Value Date/Time   BNP 100.0 10/02/2021 0948    ProBNP No results found for: PROBNP  PFT No results found for: FEV1PRE, FEV1POST, FVCPRE, FVCPOST, TLC, DLCOUNC, PREFEV1FVCRT, PSTFEV1FVCRT  DG Chest Port 1 View Result Date: 01/27/2024 CLINICAL DATA:  Status post bronchoscopy and biopsy. EXAM: PORTABLE CHEST 1 VIEW COMPARISON:  Chest radiograph dated 09/18/2023. FINDINGS: No pneumothorax post biopsy. Right lung base opacity with associated biopsy clip no pleural effusion. Stable cardiac silhouette. No acute osseous pathology. IMPRESSION: No pneumothorax post biopsy. Electronically Signed   By: Vanetta Chou M.D.   On: 01/27/2024 10:22   DG C-ARM BRONCHOSCOPY Result Date: 01/27/2024 C-ARM BRONCHOSCOPY: Fluoroscopy was utilized by the requesting physician.  No radiographic interpretation.     Past medical hx Past Medical History:  Diagnosis Date   Breast cancer (HCC) 2001   Left   Diabetes mellitus type 2, uncomplicated (HCC)    Hypothyroidism    Neuropathy    feet and hands   Personal history of chemotherapy    Personal history of radiation therapy    Pure hypercholesterolemia    Thyroid  cancer (HCC)    Unspecified disorder of thyroid     Unspecified essential hypertension      Social History   Tobacco Use   Smoking status: Former    Current packs/day: 0.00    Average packs/day: 1 pack/day for 41.0 years (41.0 ttl pk-yrs)    Types: Cigarettes    Start date: 08/02/1956    Quit date: 08/02/1997    Years since quitting: 26.5   Smokeless  tobacco: Never  Vaping Use   Vaping status: Never Used  Substance Use Topics   Alcohol use: Yes    Alcohol/week: 7.0 standard drinks of alcohol    Types: 7 Glasses of wine per week    Comment: glass of wine every day   Drug use: No    [name] reports that she quit smoking about 26 years ago. Her smoking use included cigarettes. She started smoking about 67 years ago. She has a 41 pack-year smoking history. She has never used smokeless tobacco. She reports current alcohol use of about 7.0 standard drinks of alcohol per week. She reports that she does not use drugs.  Tobacco Cessation: Counseling given: Not Answered Former smoker   Past surgical hx, Family hx, Social hx all reviewed.  Current  Outpatient Medications on File Prior to Visit  Medication Sig   albuterol  (VENTOLIN  HFA) 108 (90 Base) MCG/ACT inhaler Inhale 2 puffs into the lungs every 6 (six) hours as needed for wheezing or shortness of breath.   albuterol  (VENTOLIN  HFA) 108 (90 Base) MCG/ACT inhaler Inhale 2 puffs into the lungs every 6 (six) hours as needed for wheezing or shortness of breath.   aspirin  EC 81 MG tablet Take 1 tablet (81 mg total) by mouth daily. Swallow whole.   busPIRone  (BUSPAR ) 5 MG tablet Take 1 tablet (5 mg total) by mouth 2 (two) times daily.   CALCIUM -VITAMIN D PO Take 1 tablet by mouth 2 (two) times daily.    cetirizine  (ZYRTEC ) 5 MG tablet TAKE 1 TABLET DAILY   escitalopram  (LEXAPRO ) 10 MG tablet Take 1 tablet (10 mg total) by mouth daily.   Multiple Vitamin (MULTIVITAMIN) tablet Take 1 tablet by mouth daily.   nadolol  (CORGARD ) 20 MG tablet Take 1 tablet (20 mg total) by mouth daily.   nitroGLYCERIN  (NITROSTAT ) 0.4 MG SL tablet Place 1 tablet (0.4 mg total) under the tongue every 5 (five) minutes as needed for chest pain.   Omega-3-acid Ethyl Esters (OMACOR PO) Take 1 tablet by mouth 2 (two) times daily.    rosuvastatin  (CRESTOR ) 10 MG tablet TAKE 1 TABLET DAILY   Spacer/Aero-Hold Chamber Mask  MISC Spacer for inhaler   Spacer/Aero-Holding Chambers (AEROCHAMBER PLUS) Device Please give one spacer   SYNTHROID  100 MCG tablet TAKE 1 TABLET DAILY   vitamin B-12 (CYANOCOBALAMIN ) 1000 MCG tablet Take 1,000 mcg by mouth once a week. Takes on Mondays   No current facility-administered medications on file prior to visit.     Allergies  Allergen Reactions   Codeine Other (See Comments)    GI upset    Review Of Systems:  Constitutional:   No  weight loss, night sweats,  Fevers, chills, fatigue, or  lassitude.  HEENT:   No headaches,  Difficulty swallowing,  Tooth/dental problems, or  Sore throat,                No sneezing, itching, ear ache, nasal congestion, post nasal drip,   CV:  No chest pain,  Orthopnea, PND, swelling in lower extremities, anasarca, dizziness, palpitations, syncope.   GI  No heartburn, indigestion, abdominal pain, nausea, vomiting, diarrhea, change in bowel habits, loss of appetite, bloody stools.   Resp: No shortness of breath with exertion or at rest.  No excess mucus, no productive cough,  No non-productive cough,  No coughing up of blood.  No change in color of mucus.  No wheezing.  No chest wall deformity  Skin: no rash or lesions.  GU: no dysuria, change in color of urine, no urgency or frequency.  No flank pain, no hematuria   MS:  No joint pain or swelling.  No decreased range of motion.  No back pain.  Psych:  No change in mood or affect. No depression or anxiety.  No memory loss.   Vital Signs BP 119/73 (BP Location: Right Arm, Patient Position: Sitting, Cuff Size: Normal)   Pulse 66   Temp 98.3 F (36.8 C) (Oral)   Ht 5' 8 (1.727 m)   Wt 161 lb 3.2 oz (73.1 kg)   SpO2 91%   BMI 24.51 kg/m    Physical Exam:  General- No distress,  A&Ox3, pleasant ENT: No sinus tenderness, TM clear, pale nasal mucosa, no oral exudate,no post nasal drip, no LAN Cardiac: S1, S2,  regular rate and rhythm, no murmur Chest: No wheeze/ rales/ dullness; no  accessory muscle use, no nasal flaring, no sternal retractions Abd.: Soft Non-tender, ND, BS +, Body mass index is 24.51 kg/m.  Ext: No clubbing cyanosis, edema Neuro:  normal strength Skin: No rashes, warm and dry Psych: normal mood and behavior   Assessment/Plan New diagnosis adenocarcinoma of the lung  Post bronchoscopy with biopsies History of breast cancer  History of thyroid  cancer  Former smoker Plan I am glad you have done well after your bronchoscopy with biopsies. We have reviewed the results together and it showed that you have a non-small cell lung cancer called adenocarcinoma in the right middle lobe. I have referred you to radiation oncology for treatment. You should get a phone call today or tomorrow to set up your first consultation appointment. They will take great care of you at the cancer center here in [place]. Please call if you have any questions or concerns. It has been a pleasure being part of your care team.  Call us  if you need us .  Please contact office for sooner follow up if symptoms do not improve or worsen or seek emergency care    I spent 30 minutes dedicated to the care of this patient on the date of this encounter to include pre-visit review of records, face-to-face time with the patient discussing conditions above, post visit ordering of testing, clinical documentation with the electronic health record, making appropriate referrals as documented, and communicating necessary information to the patient's healthcare team.   Lauraine JULIANNA Lites, NP 02/03/2024  10:41 AM

## 2024-02-04 IMAGING — CT CT ANGIO CHEST
2 of 7 series · 17 of 46 positions shown · IV contrast (APPLIED)
Comparison: None.

CLINICAL DATA: Elevated D-dimer.  Dry cough.

EXAM:
CT ANGIOGRAPHY CHEST WITH CONTRAST
TECHNIQUE: Multidetector CT imaging of the chest was performed using the
standard protocol during bolus administration of intravenous
contrast. Multiplanar CT image reconstructions and MIPs were
obtained to evaluate the vascular anatomy.

[Series 7: thins · axial · 0.90mm/px · z∈[-179,+109]mm · 14 of 463 slices shown]
[im 26/463  lung]
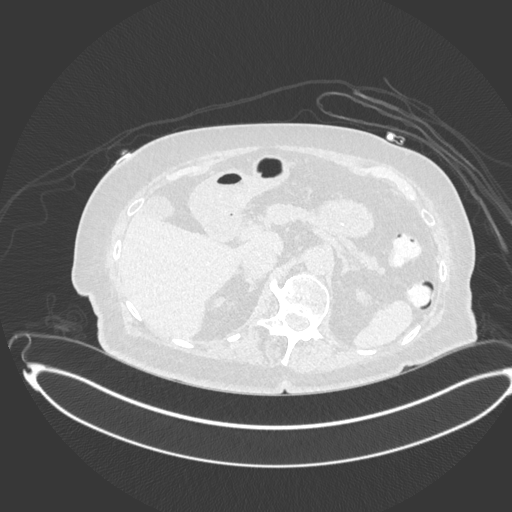
[im 52/463  soft-tissue]
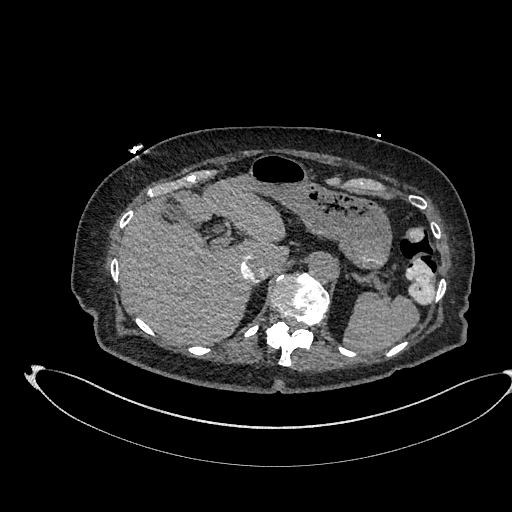
[im 103/463  lung]
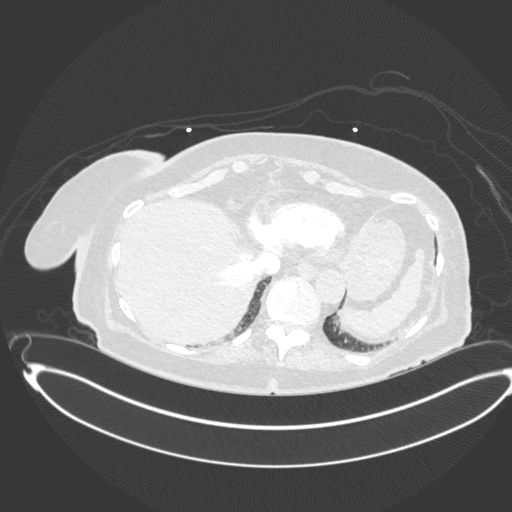
[im 129/463  soft-tissue]
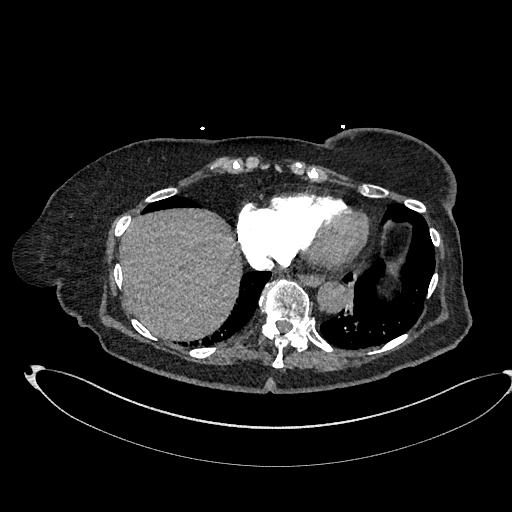
[im 155/463  lung]
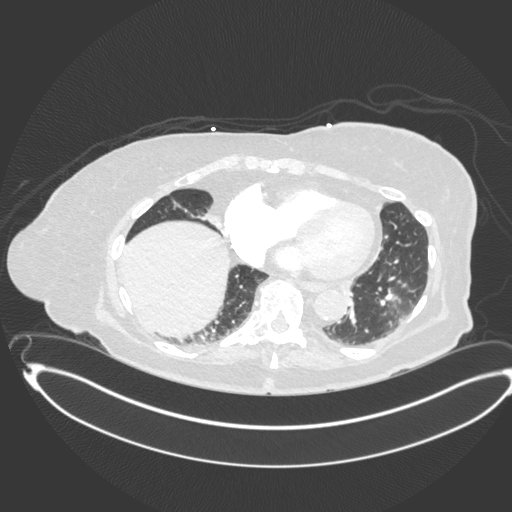
[im 180/463  soft-tissue]
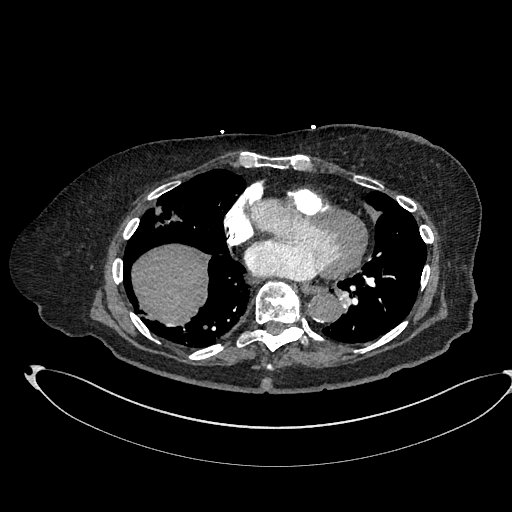
[im 206/463  lung]
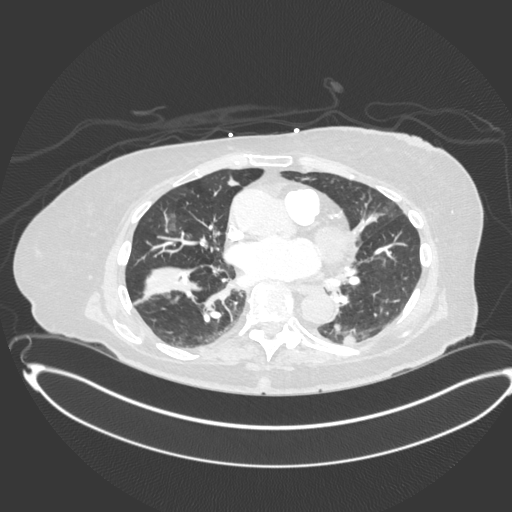
[im 257/463  soft-tissue]
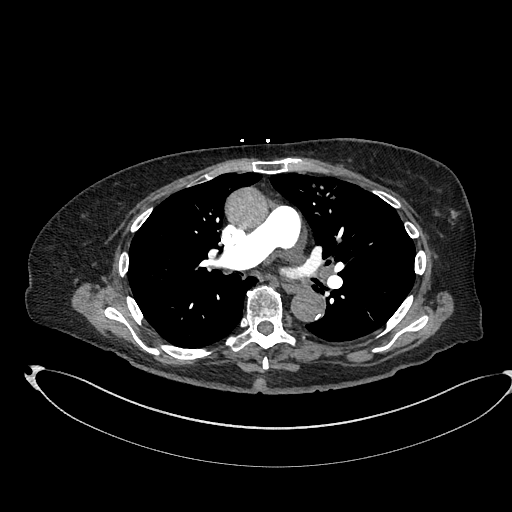
[im 283/463  lung]
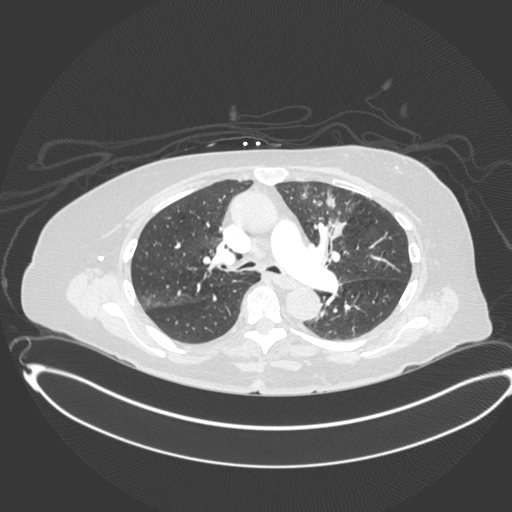
[im 309/463  soft-tissue]
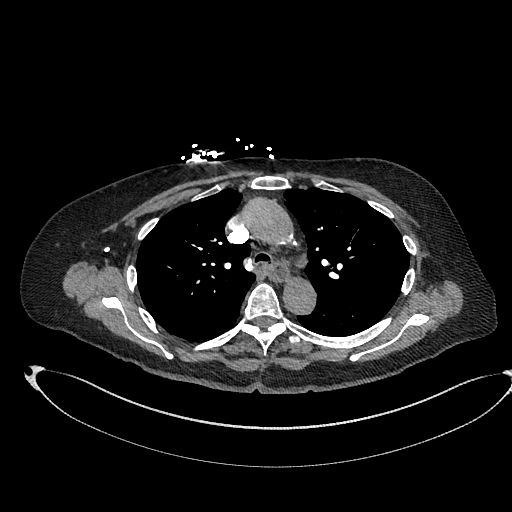
[im 334/463  lung]
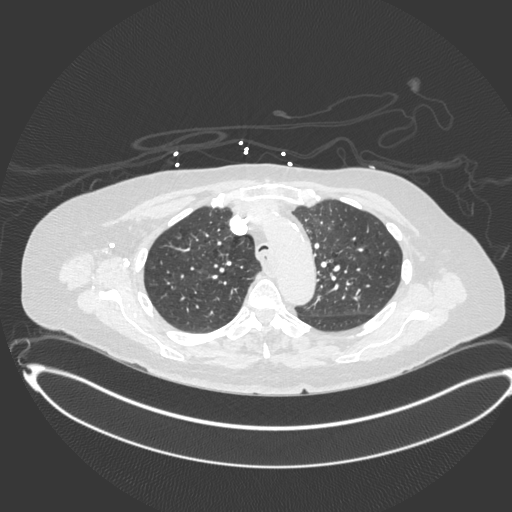
[im 360/463  soft-tissue]
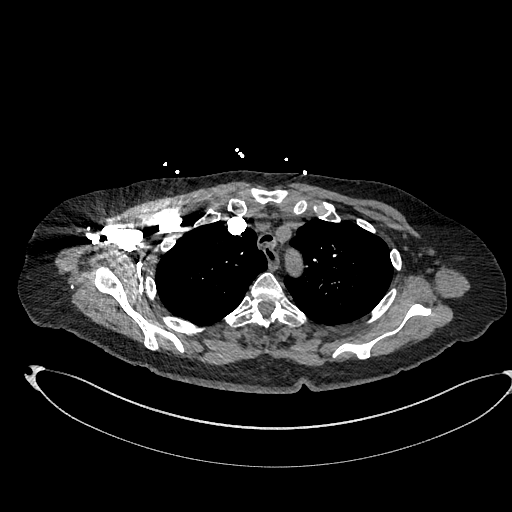
[im 411/463  lung]
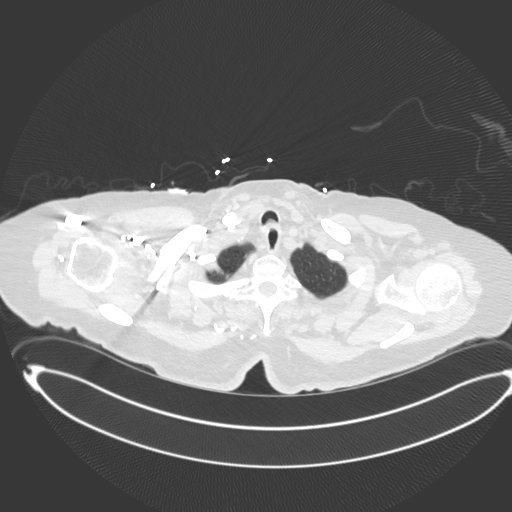
[im 437/463  soft-tissue]
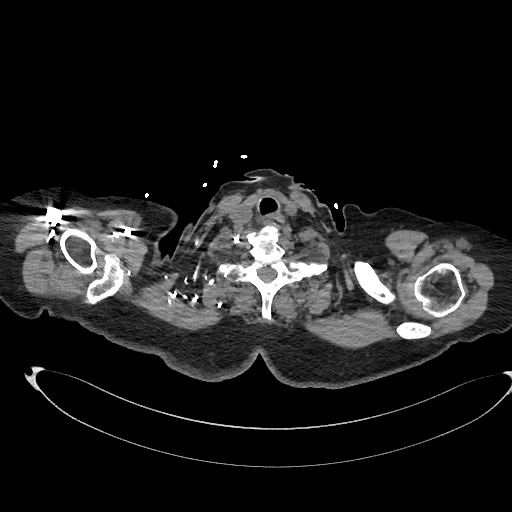

[Series 9: cor · coronal · 0.66mm/px · 3 of 136 slices shown]
[im 34/136  soft-tissue]
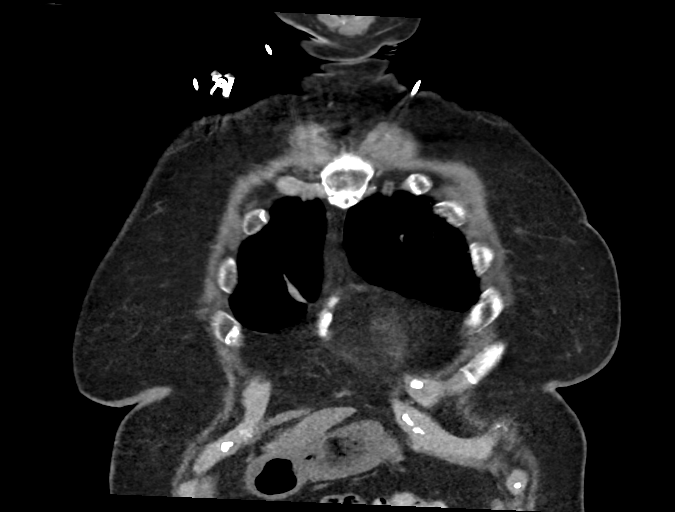
[im 68/136  soft-tissue]
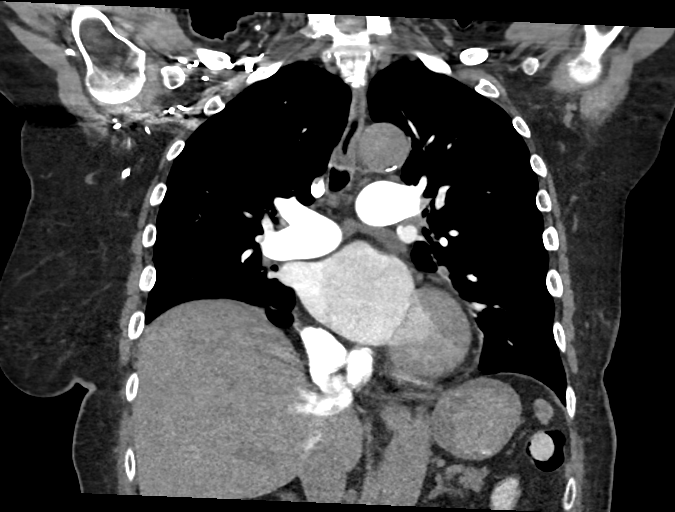
[im 102/136  soft-tissue]
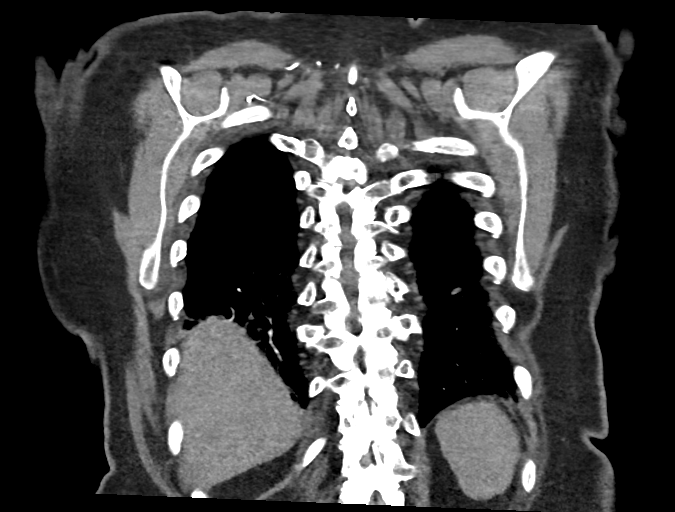

[17 of 46 positions shown; findings below may reference images not displayed]

RADIATION DOSE REDUCTION: This exam was performed according to the
departmental dose-optimization program which includes automated
exposure control, adjustment of the mA and/or kV according to
patient size and/or use of iterative reconstruction technique.

CONTRAST:  100mL OMNIPAQUE IOHEXOL 350 MG/ML SOLN
FINDINGS: Cardiovascular: Satisfactory opacification of the pulmonary arteries
to the segmental level. No evidence of pulmonary embolism. Normal
heart size. No pericardial effusion. Thoracic aortic
atherosclerosis. Coronary artery atherosclerosis. Reflux of contrast
into the IVC and hepatic veins. Right and left main pulmonary
arteries are dilated measuring 2.7 cm in diameter. Ascending
thoracic aorta measures 4 cm in greatest AP diameter.

Mediastinum/Nodes: No enlarged mediastinal, hilar, or axillary lymph
nodes. Thyroid gland, trachea, and esophagus demonstrate no
significant findings.

Lungs/Pleura: Mild bilateral centrilobular emphysema. Left upper
lobe, right middle lobe airspace disease and to lesser extent left
lower lobe airspace disease concerning for multilobar pneumonia. No
pleural effusion or pneumothorax.

Upper Abdomen: No acute abnormality.

Musculoskeletal: No acute osseous abnormality. No aggressive osseous
lesion. Mild degenerative disc disease and facet arthropathy of the
cervicothoracic spine. S-shaped curvature of the thoracolumbar
spine.

Review of the MIP images confirms the above findings.
IMPRESSION: 1. No pulmonary embolism.
2. Left upper lobe, right middle lobe airspace disease and to lesser
extent left lower lobe airspace disease concerning for multilobar
pneumonia.
3. Ascending thoracic aorta measures 4 cm in greatest AP diameter.
Recommend annual imaging followup by CTA or MRA. This recommendation
follows 0969 ACCF/AHA/AATS/ACR/ASA/SCA/VER/VANHECKE/KANLAYA/HOODA Guidelines
for the Diagnosis and Management of Patients with Thoracic Aortic
Disease. Circulation. 0969; 121: E266-e369. Aortic aneurysm NOS
(18TGK-9PX.2)
4. Reflux of contrast into the IVC and hepatic veins, suggestive of
right heart dysfunction.
5. Dilated right and left main pulmonary arteries, suggestive of
pulmonary arterial hypertension.
6. Aortic Atherosclerosis (18TGK-SW5.5) and Emphysema (18TGK-B98.W).

## 2024-02-06 ENCOUNTER — Other Ambulatory Visit: Payer: Self-pay

## 2024-02-06 NOTE — Progress Notes (Signed)
 The proposed treatment discussed in conference is for discussion purpose only and is not a binding recommendation.  The patients have not been physically examined, or presented with their treatment options.  Therefore, final treatment plans cannot be decided.

## 2024-02-07 ENCOUNTER — Ambulatory Visit
Admission: RE | Admit: 2024-02-07 | Discharge: 2024-02-07 | Disposition: A | Discharge status: Home / Self Care | Source: Ambulatory Visit | Attending: Radiation Oncology | Admitting: Radiation Oncology

## 2024-02-07 ENCOUNTER — Encounter: Payer: Self-pay | Admitting: Radiation Oncology

## 2024-02-07 VITALS — BP 136/91 | HR 76 | Temp 97.1°F | Resp 18 | Ht 68.0 in | Wt 161.5 lb

## 2024-02-07 DIAGNOSIS — Z87891 Personal history of nicotine dependence: Secondary | ICD-10-CM | POA: Diagnosis not present

## 2024-02-07 DIAGNOSIS — C342 Malignant neoplasm of middle lobe, bronchus or lung: Secondary | ICD-10-CM | POA: Insufficient documentation

## 2024-02-07 HISTORY — DX: Malignant neoplasm of unspecified part of unspecified bronchus or lung: C34.90

## 2024-02-07 NOTE — Progress Notes (Signed)
 Thoracic Location of Tumor / Histology:  Malignant neoplasm of middle lobe of right lung [facility])   Patient presented  months ago with symptoms of:  Bronchial cough and pneumonia    Biopsies revealed:   12/05/2023 CT Chest W Contrast   Tobacco/Marijuana/Snuff/ETOH use:  Former Tobacco user  Past/Anticipated interventions by Pulmonology if any:  Byrum, MD Flexible Video Fiberoptic Bronchoscopy with Robotic Assistance    Signs/Symptoms Weight changes, if any: None Respiratory complaints, if any: None Hemoptysis, if any: None Pain issues, if any:  None  SAFETY ISSUES: Prior radiation? Yes,  Pacemaker/ICD? None  Possible current pregnancy?None Is the patient on methotrexate? None  Current Complaints / other details:   None

## 2024-02-07 NOTE — Progress Notes (Signed)
 Radiation Oncology         [phone] ________________________________  Initial Outpatient Consultation  Name: Kim Singh MRN: [id number]  Date: 02/07/2024  DOB: [birth year]  RR:Ylwuzm, Garnette KIDD, MD  Shelah Lamar RAMAN, MD   REFERRING PHYSICIAN: Shelah Lamar RAMAN, MD  DIAGNOSIS:    ICD-10-CM   1. Non-small cell cancer of middle lobe of right lung [facility])  C34.2        Cancer Staging  Non-small cell cancer of middle lobe of right lung [facility]) Staging form: Lung, AJCC V9 - Clinical stage from 02/07/2024: cT2, cN0, cM0 - Signed by Izell Domino, MD on 02/07/2024 Laterality: Right  Adenocarcinoma of the right middle lung   (Prior history of breast cancer and thyroid  cancer - BC diagnosed in 2001 s/p lumpectomy, chemo, and XRT)  CHIEF COMPLAINT: Here to discuss management of right lung cancer  HISTORY OF PRESENT ILLNESS::Kim Singh is a 83 y.o. female with a history of breast cancer and thyroid  cancer. She is seen today for consideration of radiation therapy in management of her recently diagnosed right lung cancer.   She presented for a CTA of the chest on 08/22/23, in the setting of thoracic and aortic disease (and recent PNA), which demonstrated a persistent but nonspecific spiculated centrally cavitary nodule/mass within the right middle lobe measuring 3.5 x 3.1 x 2.0 cm (previously demonstrated in March of 2023). Other pulmonary findings included a 6 mm LLL nodule and a 4 mm LLL nodule. Cardio findings included stable uncomplicated mild fusiform aneurysmal dilatation of the ascending thoracic aorta measuring up to 4.1 cm.   She also soon after presented to the ED on 02/26 with a persistent cough. A chest x-ray was performed which showed persistent opacities in the right lung base,  corresponding to cavitary lesion on prior chest CT.  Based on her CT findings, she presented for a PET scan on 09/13/23 which demonstrated: mild hypermetabolism associated with the RML mass. Given the  relative size, morphology, and stability since 2021, chronic infection was favored for this finding. PET otherwise showed no evidence of hypermetabolic nodal or distant metastatic disease.   She was later referred to Dr. Lanny office at [facility] pulmonary in April and repeat imaging was recommended. A restaging chest CT was subsequently performed on 12/05/23 which showed no change in size but an increase in thickening of the RML mass, measuring 3.7 cm. Imaging also showed stability of the previously demonstrated additional pulmonary nodules.   Based on Dr. Lanny recommendations, she opted to proceed with a bronchoscopy on 01/27/24. FNA of the RML collected at that time showed findings consistent with adenocarcinoma. RML brushings were also collected and showed atypical cells present.   Smoking history: 41-pack-year smoking history. She quit smoking on August 02, 1997.  She reports that her thyroid  cancer was treated with radioactive iodine and surgery.  See treatments below for her breast cancer.  PATH DETAILS FROM 01-27-24:  FINAL MICROSCOPIC DIAGNOSIS:   A. LUNG, RML, FINE NEEDLE ASPIRATION:  - Adenocarcinoma, see comment   B. LUNG, RML, BRUSHING:  - Atypical cells present    PREVIOUS RADIATION THERAPY: Yes   Prior history of left breast cancer diagnosed in 2001: s/p lumpectomy, adjuvant chemotherapy, and adjuvant radiation therapy   PAST MEDICAL HISTORY:  has a past medical history of Breast cancer (HCC) (2001), Diabetes mellitus type 2, uncomplicated (HCC), Hypothyroidism, Lung cancer (HCC), Neuropathy, Personal history of chemotherapy, Personal history of radiation therapy, Pure hypercholesterolemia, Thyroid  cancer (HCC), Unspecified disorder of thyroid ,  and Unspecified essential hypertension.    PAST SURGICAL HISTORY: Past Surgical History:  Procedure Laterality Date   BREAST EXCISIONAL BIOPSY Left    BREAST LUMPECTOMY Left 2001   BRONCHIAL BIOPSY  01/27/2024   Procedure:  BRONCHOSCOPY, WITH BIOPSY;  Surgeon: Shelah Lamar RAMAN, MD;  Location: [facility] ENDOSCOPY;  Service: Pulmonary;;   BRONCHIAL BRUSHINGS  01/27/2024   Procedure: BRONCHOSCOPY, WITH BRUSH BIOPSY;  Surgeon: Shelah Lamar RAMAN, MD;  Location: [facility] ENDOSCOPY;  Service: Pulmonary;;   BRONCHIAL NEEDLE ASPIRATION BIOPSY  01/27/2024   Procedure: BRONCHOSCOPY, WITH NEEDLE ASPIRATION BIOPSY;  Surgeon: Shelah Lamar RAMAN, MD;  Location[facility] ENDOSCOPY;  Service: Pulmonary;;   BRONCHIAL WASHINGS  01/27/2024   Procedure: IRRIGATION, BRONCHUS;  Surgeon: Shelah Lamar RAMAN, MD;  Location: [facility] ENDOSCOPY;  Service: Pulmonary;;   FIDUCIAL MARKER PLACEMENT  01/27/2024   Procedure: INSERTION, FIDUCIAL MARKERS;  Surgeon: Shelah Lamar RAMAN, MD;  Location: [facility] ENDOSCOPY;  Service: Pulmonary;;   THYROIDECTOMY     total   VIDEO BRONCHOSCOPY WITH ENDOBRONCHIAL NAVIGATION Right 01/27/2024   Procedure: VIDEO BRONCHOSCOPY WITH ENDOBRONCHIAL NAVIGATION;  Surgeon: Shelah Lamar RAMAN, MD;  Location: [facility] ENDOSCOPY;  Service: Pulmonary;  Laterality: Right;    FAMILY HISTORY: family history includes Cancer in her sister; Depression in her son; Diabetes in her mother; Hearing loss in her father.  SOCIAL HISTORY:  reports that she quit smoking about 26 years ago. Her smoking use included cigarettes. She started smoking about 67 years ago. She has a 41 pack-year smoking history. She has never used smokeless tobacco. She reports current alcohol use of about 7.0 standard drinks of alcohol per week. She reports that she does not use drugs.  ALLERGIES: Codeine  MEDICATIONS:  Current Outpatient Medications  Medication Sig Dispense Refill   albuterol  (VENTOLIN  HFA) 108 (90 Base) MCG/ACT inhaler Inhale 2 puffs into the lungs every 6 (six) hours as needed for wheezing or shortness of breath. 8 g 0   albuterol  (VENTOLIN  HFA) 108 (90 Base) MCG/ACT inhaler Inhale 2 puffs into the lungs every 6 (six) hours as needed for wheezing or shortness of breath. 8 g 2   aspirin  EC 81 MG  tablet Take 1 tablet (81 mg total) by mouth daily. Swallow whole.     busPIRone  (BUSPAR ) 5 MG tablet Take 1 tablet (5 mg total) by mouth 2 (two) times daily. 180 tablet 3   CALCIUM -VITAMIN D PO Take 1 tablet by mouth 2 (two) times daily.      cetirizine  (ZYRTEC ) 5 MG tablet TAKE 1 TABLET DAILY 90 tablet 3   escitalopram  (LEXAPRO ) 10 MG tablet Take 1 tablet (10 mg total) by mouth daily. 90 tablet 3   Multiple Vitamin (MULTIVITAMIN) tablet Take 1 tablet by mouth daily.     nadolol  (CORGARD ) 20 MG tablet Take 1 tablet (20 mg total) by mouth daily. 90 tablet 3   nitroGLYCERIN  (NITROSTAT ) 0.4 MG SL tablet Place 1 tablet (0.4 mg total) under the tongue every 5 (five) minutes as needed for chest pain. 30 tablet 3   Omega-3-acid Ethyl Esters (OMACOR PO) Take 1 tablet by mouth 2 (two) times daily.      rosuvastatin  (CRESTOR ) 10 MG tablet TAKE 1 TABLET DAILY 90 tablet 3   Spacer/Aero-Hold Chamber Mask MISC Spacer for inhaler 1 each 0   Spacer/Aero-Holding Chambers (AEROCHAMBER PLUS) Device Please give one spacer 1 each 1   SYNTHROID  100 MCG tablet TAKE 1 TABLET DAILY 90 tablet 3   vitamin B-12 (CYANOCOBALAMIN ) 1000 MCG tablet Take 1,000  mcg by mouth once a week. Takes on Mondays     No current facility-administered medications for this encounter.    REVIEW OF SYSTEMS:  Notable for that above.   PHYSICAL EXAM:  height is 5' 8 (1.727 m) and weight is 161 lb 8 oz (73.3 kg). Her temporal temperature is 97.1 F (36.2 C) (abnormal). Her blood pressure is 136/91 (abnormal) and her pulse is 76. Her respiration is 18 and oxygen  saturation is 97%.   General: Alert and oriented, in no acute distress  HEENT: Head is normocephalic. Extraocular movements are intact.  Neck: Notable for thyroid  surgical scar Heart: Regular in rate and rhythm with no murmurs, rubs, or gallops. Chest: Clear to auscultation bilaterally, with no rhonchi, wheezes, or rales. Abdomen: Soft, nontender, nondistended, with no rigidity or  guarding. Extremities: Mild pedal and ankle edema, more noticeable on left; patient reports this is chronic and consistent with previous ankle injury Skin: No concerning lesions. Musculoskeletal: symmetric strength and muscle tone throughout. Neurologic: Cranial nerves II through XII are grossly intact. No obvious focalities. Speech is fluent. Coordination is intact. Psychiatric: Judgment and insight are intact. Affect is appropriate.   ECOG = 0  0 - Asymptomatic (Fully active, able to carry on all predisease activities without restriction)  1 - Symptomatic but completely ambulatory (Restricted in physically strenuous activity but ambulatory and able to carry out work of a light or sedentary nature. For example, light housework, office work)  2 - Symptomatic, <50% in bed during the day (Ambulatory and capable of all self care but unable to carry out any work activities. Up and about more than 50% of waking hours)  3 - Symptomatic, >50% in bed, but not bedbound (Capable of only limited self-care, confined to bed or chair 50% or more of waking hours)  4 - Bedbound (Completely disabled. Cannot carry on any self-care. Totally confined to bed or chair)  5 - Death   Raylene MM, Creech RH, Tormey DC, et al. [phone]). Toxicity and response criteria of the [facility]. Am. DOROTHA Bridges. Oncol. 5 (6): 649-55   LABORATORY DATA:  Lab Results  Component Value Date   WBC 5.8 01/27/2024   HGB 14.6 01/27/2024   HCT 43.7 01/27/2024   MCV 96.3 01/27/2024   PLT 172 01/27/2024   CMP     Component Value Date/Time   NA 139 01/27/2024 0650   K 3.5 01/27/2024 0650   CL 102 01/27/2024 0650   CO2 26 01/27/2024 0650   GLUCOSE 118 (H) 01/27/2024 0650   BUN 14 01/27/2024 0650   CREATININE 0.83 01/27/2024 0650   CALCIUM  9.2 01/27/2024 0650   PROT 6.6 08/19/2023 1007   ALBUMIN 4.1 08/19/2023 1007   AST 25 08/19/2023 1007   ALT 20 08/19/2023 1007   ALKPHOS 71 08/19/2023 1007    BILITOT 1.0 08/19/2023 1007   GFR 75.33 08/19/2023 1007   GFRNONAA >60 01/27/2024 0650         RADIOGRAPHY: DG Chest Port 1 View Result Date: 01/27/2024 CLINICAL DATA:  Status post bronchoscopy and biopsy. EXAM: PORTABLE CHEST 1 VIEW COMPARISON:  Chest radiograph dated 09/18/2023. FINDINGS: No pneumothorax post biopsy. Right lung base opacity with associated biopsy clip no pleural effusion. Stable cardiac silhouette. No acute osseous pathology. IMPRESSION: No pneumothorax post biopsy. Electronically Signed   By: Vanetta Chou M.D.   On: 01/27/2024 10:22   DG C-ARM BRONCHOSCOPY Result Date: 01/27/2024 C-ARM BRONCHOSCOPY: Fluoroscopy was utilized by the requesting physician.  No  radiographic interpretation.      IMPRESSION/PLAN: Cancer Staging  Non-small cell cancer of middle lobe of right lung [facility]) Staging form: Lung, AJCC V9 - Clinical stage from 02/07/2024: cT2, cN0, cM0 - Signed by Izell Domino, MD on 02/07/2024 Laterality: Right     Lovely 83 yo woman with diagnosis and imaging as above   Today, I talked to the patient and her daughter about the findings and work-up thus far.  We discussed the patient's diagnosis of adenocarcinoma of the right middle lung and general treatment for this, highlighting the role of radiotherapy in the management.  We discussed the available radiation techniques, and focused on the details of logistics and delivery.   We also discussed that she is potentially a good candidate for surgery.  She was discussed at our tumor board and recommendation was made for her to see cardiothoracic surgery.  I spoke with the patient and her daughter about this recommendation so that she fully understands her options.  However, the patient is quite adamant that she does not want to undergo any surgical resection.  She has been through surgery for thyroid  cancer and breast cancer and she states that she is done with surgery at this stage in her life.  After lengthy discussion  the patient declined cardiothoracic surgical referral  We discussed the risks, benefits, and side effects of stereotactic body radiotherapy as curative treatment for her lung cancer.  Would anticipate 5 fractions of radiation therapy given every other day.  Side effects may include but not necessarily be limited to: Skin irritation, fatigue, rib fracture, chest wall pain lung injury; no guarantees of treatment were given. A consent form was signed and placed in the patient's medical record. The patient was encouraged to ask questions that I answered to the best of my ability.    We will schedule her for treatment planning in the near future.  On date of service, in total, I spent 75 minutes on this encounter. Patient was seen in person.  __________________________________________   Domino Izell, MD  This document serves as a record of services personally performed by Domino Izell, MD. It was created on her behalf by Dorthy Fuse, a trained medical scribe. The creation of this record is based on the scribe's personal observations and the provider's statements to them. This document has been checked and approved by the attending provider.

## 2024-02-11 ENCOUNTER — Ambulatory Visit (INDEPENDENT_AMBULATORY_CARE_PROVIDER_SITE_OTHER): Payer: Medicare Other | Admitting: Family Medicine

## 2024-02-11 ENCOUNTER — Ambulatory Visit
Admission: RE | Admit: 2024-02-11 | Discharge: 2024-02-11 | Disposition: A | Discharge status: Home / Self Care | Source: Ambulatory Visit | Attending: Radiation Oncology | Admitting: Radiation Oncology

## 2024-02-11 ENCOUNTER — Encounter: Payer: Self-pay | Admitting: Family Medicine

## 2024-02-11 VITALS — BP 104/68 | HR 72 | Temp 97.4°F | Ht 68.0 in | Wt 162.8 lb

## 2024-02-11 DIAGNOSIS — Z87891 Personal history of nicotine dependence: Secondary | ICD-10-CM | POA: Diagnosis not present

## 2024-02-11 DIAGNOSIS — E1129 Type 2 diabetes mellitus with other diabetic kidney complication: Secondary | ICD-10-CM

## 2024-02-11 DIAGNOSIS — Z7984 Long term (current) use of oral hypoglycemic drugs: Secondary | ICD-10-CM

## 2024-02-11 DIAGNOSIS — I10 Essential (primary) hypertension: Secondary | ICD-10-CM | POA: Diagnosis not present

## 2024-02-11 DIAGNOSIS — B001 Herpesviral vesicular dermatitis: Secondary | ICD-10-CM

## 2024-02-11 DIAGNOSIS — E039 Hypothyroidism, unspecified: Secondary | ICD-10-CM | POA: Diagnosis not present

## 2024-02-11 DIAGNOSIS — Z510 Encounter for antineoplastic radiation therapy: Secondary | ICD-10-CM | POA: Insufficient documentation

## 2024-02-11 DIAGNOSIS — C342 Malignant neoplasm of middle lobe, bronchus or lung: Secondary | ICD-10-CM | POA: Insufficient documentation

## 2024-02-11 DIAGNOSIS — R918 Other nonspecific abnormal finding of lung field: Secondary | ICD-10-CM | POA: Diagnosis not present

## 2024-02-11 DIAGNOSIS — R809 Proteinuria, unspecified: Secondary | ICD-10-CM | POA: Diagnosis not present

## 2024-02-11 LAB — POCT GLYCOSYLATED HEMOGLOBIN (HGB A1C): Hemoglobin A1C: 5.8 % — AB (ref 4.0–5.6)

## 2024-02-11 MED ORDER — VALACYCLOVIR HCL 1 G PO TABS: ORAL_TABLET | ORAL | 1 refills | Status: AC

## 2024-02-11 NOTE — Progress Notes (Signed)
 Phone [phone] In person visit   Subjective:   Kim Singh is a 83 y.o. year old very pleasant female patient who presents for/with See problem oriented charting Chief Complaint  Patient presents with   Medical Management of Chronic Issues   Hypothyroidism   Diabetes    Past Medical History-  Patient Active Problem List   Diagnosis Date Noted   Coronary artery disease involving native coronary artery of native heart without angina pectoris 10/24/2023    Priority: High   Ascending aortic aneurysm (HCC) 10/09/2021    Priority: High   Type II diabetes mellitus with neurological manifestations (HCC) 11/29/2013    Priority: High   Essential tremor 11/12/2023    Priority: Medium    Aortic atherosclerosis (HCC) 10/09/2021    Priority: Medium    Emphysema lung (HCC) 10/09/2021    Priority: Medium    Diabetic neuropathy (HCC) 08/22/2015    Priority: Medium    Depression 02/28/2015    Priority: Medium    Dyslipidemia 11/29/2013    Priority: Medium    Hypothyroidism 11/29/2013    Priority: Medium    Essential hypertension 11/29/2013    Priority: Medium    Insomnia 12/19/2018    Priority: Low   Former smoker 02/28/2015    Priority: Low   History of breast cancer 11/30/2013    Priority: Low   History of thyroid  cancer 11/30/2013    Priority: Low   Orthostatic hypotension 11/29/2013    Priority: Low   Non-small cell cancer of middle lobe of right lung (HCC) 02/07/2024   Right middle lobe pulmonary nodule 01/10/2024   Precordial pain 10/24/2023   Mixed hyperlipidemia 10/24/2023    Medications- reviewed and updated Current Outpatient Medications  Medication Sig Dispense Refill   albuterol  (VENTOLIN  HFA) 108 (90 Base) MCG/ACT inhaler Inhale 2 puffs into the lungs every 6 (six) hours as needed for wheezing or shortness of breath. 8 g 0   albuterol  (VENTOLIN  HFA) 108 (90 Base) MCG/ACT inhaler Inhale 2 puffs into the lungs every 6 (six) hours as needed for wheezing or  shortness of breath. 8 g 2   aspirin  EC 81 MG tablet Take 1 tablet (81 mg total) by mouth daily. Swallow whole.     busPIRone  (BUSPAR ) 5 MG tablet Take 1 tablet (5 mg total) by mouth 2 (two) times daily. 180 tablet 3   CALCIUM -VITAMIN D PO Take 1 tablet by mouth 2 (two) times daily.      cetirizine  (ZYRTEC ) 5 MG tablet TAKE 1 TABLET DAILY 90 tablet 3   escitalopram  (LEXAPRO ) 10 MG tablet Take 1 tablet (10 mg total) by mouth daily. 90 tablet 3   Multiple Vitamin (MULTIVITAMIN) tablet Take 1 tablet by mouth daily.     nadolol  (CORGARD ) 20 MG tablet Take 1 tablet (20 mg total) by mouth daily. 90 tablet 3   nitroGLYCERIN  (NITROSTAT ) 0.4 MG SL tablet Place 1 tablet (0.4 mg total) under the tongue every 5 (five) minutes as needed for chest pain. 30 tablet 3   Omega-3-acid Ethyl Esters (OMACOR PO) Take 1 tablet by mouth 2 (two) times daily.      rosuvastatin  (CRESTOR ) 10 MG tablet TAKE 1 TABLET DAILY 90 tablet 3   Spacer/Aero-Hold Chamber Mask MISC Spacer for inhaler 1 each 0   Spacer/Aero-Holding Chambers (AEROCHAMBER PLUS) Device Please give one spacer 1 each 1   SYNTHROID  100 MCG tablet TAKE 1 TABLET DAILY 90 tablet 3   valACYclovir  (VALTREX ) 1000 MG tablet Take 2 pills twice  a day for 1 day at first sign of cold sore 28 tablet 1   vitamin B-12 (CYANOCOBALAMIN ) 1000 MCG tablet Take 1,000 mcg by mouth once a week. Takes on Mondays     No current facility-administered medications for this visit.     Objective:  BP 104/68   Pulse 72   Temp (!) 97.4 F (36.3 C)   Ht 5' 8 (1.727 m)   Wt 162 lb 12.8 oz (73.8 kg)   SpO2 96%   BMI 24.75 kg/m  Gen: NAD, resting comfortably CV: RRR no murmurs rubs or gallops Lungs: CTAB no crackles, wheeze, rhonchi Ext: minimal edema Skin: warm, dry     Assessment and Plan   # Lung mass 2025 workup ultimately diagnosed with lung cancer- opting for radiation- but surgery was consideration- she does not want surgery but we discussed it is encouraging that  they think her health is good enough to consider surgery!  She is handling the situation overall pretty well  #dysphagia- some trouble swallowing bread and rice that gets stuck and food can come back - we recommended endoscopy but wants to hold off until after treatments-she agrees to reach out  #Diabetes with neuropathy.  S: Most recently diet controlled -in the past on Metformin  1000 mg daily -Not on medicine for neuropathy- doesn't want to take anything -trying to improve diet Lab Results  Component Value Date   HGBA1C 5.8 (A) 02/11/2024   HGBA1C 6.9 (H) 08/19/2023   HGBA1C 6.6 (H) 06/18/2022   A/P: Well-controlled without medication-continue to monitor  Microalbuminuria-pressure ratio as high as 171 when adjusting from prior lab error but was trending back down to 35 on recheck at last visit-we did not think her blood pressure could tolerate adding quinapril  at that time nor did we think so today either.  We opted to recheck levels today for stability  # Essential tremor #Hypertension S: Medication: Nadolol  20 mg daily - in the past on quinapril  5 mg (found to have a fungal lip issue and was not angioedema) BP Readings from Last 3 Encounters:  02/11/24 104/68  02/07/24 (!) 136/91  02/03/24 119/73  A/P: Blood pressure and essential tremor reasonably well-controlled-continue current medication-I do not think she has ACE inhibitor as above   Depression  s: Compliant with Lexapro  10 mg.  Loss of son to suicide in October 2018 contributes.    02/11/2024    2:01 PM 11/12/2023    3:23 PM 08/13/2023    2:37 PM  Depression screen PHQ 2/9  Decreased Interest 0 0 0  Down, Depressed, Hopeless 0 0 0  PHQ - 2 Score 0 0 0  Altered sleeping 0 0 1  Tired, decreased energy 0 0 1  Change in appetite 0 0 0  Feeling bad or failure about yourself  0 0 0  Trouble concentrating 0 0 0  Moving slowly or fidgety/restless 0 0 0  Suicidal thoughts 0 0 0  PHQ-9 Score 0 0 2  Difficult doing  work/chores Not difficult at all Not difficult at all Not difficult at all   A/P: Full remission despite recent cancer diagnosis-continue current medication and glad patient is doing reasonably well  #Hyperlipidemia-with coronary artery calcium  on CT #Aortic atherosclerosis S: medication:  Crestor  10 mg daily. Fish oil with triglycerides -ldl was 23 when also on Zetia  Lab Results  Component Value Date   CHOL 165 08/19/2023   HDL 43.00 08/19/2023   LDLCALC 78 08/19/2023   LDLDIRECT 107.0 06/18/2022  TRIG 217.0 (H) 08/19/2023   CHOLHDL 4 08/19/2023   A/P: aortic atherosclerosis (presumed stable)- LDL goal ideally <70-just a hair above ideal goal-she wants to continue current medication instead of making adjustments  #Hypothyroidism S: Compliant with levothyroxine  100 mcg Lab Results  Component Value Date   TSH 2.85 08/19/2023   A/P: Has been well-controlled-offered TSH today but she wants to hold off given other recent blood work-we did point to A1c only  #cold sores/fever blisters-  reports issues- wants to try valtrex - sent in  Recommended follow up: Return in about 6 months (around 08/13/2024) for followup or sooner if needed.Schedule b4 you leave. Future Appointments  Date Time Provider Department Center  02/19/2024  4:15 PM Izell Domino, MD [facility] None  02/21/2024  4:30 PM Izell Domino, MD [facility] None  02/26/2024  3:50 PM Izell Domino, MD [facility] None  02/28/2024  3:50 PM Izell Domino, MD [facility] None  03/02/2024  3:30 PM Izell Domino, MD [facility] None  06/04/2024  1:30 PM [facility] ANNUAL WELLNESS VISIT 1 [facility]  08/13/2024  3:20 PM Katrinka Garnette KIDD, MD [facility]    Lab/Order associations:   ICD-10-CM   1. Essential hypertension  I10     2. Diabetes mellitus with microalbuminuria (HCC)  E11.29 POCT glycosylated hemoglobin (Hb A1C)   R80.9 Urine Microalbumin w/creat. ratio    CANCELED: Urine Microalbumin w/creat. ratio    3. Lung mass   R91.8     4. Hypothyroidism, unspecified type  E03.9     5. Recurrent cold sores  B00.1       Meds ordered this encounter  Medications   valACYclovir  (VALTREX ) 1000 MG tablet    Sig: Take 2 pills twice a day for 1 day at first sign of cold sore    Dispense:  28 tablet    Refill:  1    Return precautions advised.  Garnette Katrinka, MD

## 2024-02-11 NOTE — Patient Instructions (Addendum)
 Please stop by lab before you go- just for urine If you have mychart- we will send your results within 3 business days of us  receiving them.  If you do not have mychart- we will call you about results within 5 business days of us  receiving them.  *please also note that you will see labs on mychart as soon as they post. I will later go in and write notes on them- will say notes from Dr. Katrinka   I am rooting/praying for you with upcoming radiation!   Recommended follow up: Return in about 6 months (around 08/13/2024) for followup or sooner if needed.Schedule b4 you leave.

## 2024-02-12 DIAGNOSIS — Z87891 Personal history of nicotine dependence: Secondary | ICD-10-CM | POA: Diagnosis not present

## 2024-02-12 DIAGNOSIS — Z510 Encounter for antineoplastic radiation therapy: Secondary | ICD-10-CM | POA: Diagnosis not present

## 2024-02-12 DIAGNOSIS — C342 Malignant neoplasm of middle lobe, bronchus or lung: Secondary | ICD-10-CM | POA: Diagnosis not present

## 2024-02-19 ENCOUNTER — Ambulatory Visit

## 2024-02-19 ENCOUNTER — Other Ambulatory Visit: Payer: Self-pay

## 2024-02-19 ENCOUNTER — Ambulatory Visit
Admission: RE | Admit: 2024-02-19 | Discharge: 2024-02-19 | Disposition: A | Discharge status: Home / Self Care | Source: Ambulatory Visit | Attending: Radiation Oncology | Admitting: Radiation Oncology

## 2024-02-19 DIAGNOSIS — Z510 Encounter for antineoplastic radiation therapy: Secondary | ICD-10-CM | POA: Diagnosis not present

## 2024-02-19 DIAGNOSIS — C342 Malignant neoplasm of middle lobe, bronchus or lung: Secondary | ICD-10-CM | POA: Diagnosis not present

## 2024-02-19 LAB — RAD ONC ARIA SESSION SUMMARY
Course Elapsed Days: 0
Plan Fractions Treated to Date: 1
Plan Prescribed Dose Per Fraction: 12 Gy
Plan Total Fractions Prescribed: 5
Plan Total Prescribed Dose: 60 Gy
Reference Point Dosage Given to Date: 12 Gy
Reference Point Session Dosage Given: 12 Gy
Session Number: 1

## 2024-02-20 ENCOUNTER — Ambulatory Visit: Admitting: Radiation Oncology

## 2024-02-21 ENCOUNTER — Ambulatory Visit
Admission: RE | Admit: 2024-02-21 | Discharge: 2024-02-21 | Disposition: A | Discharge status: Home / Self Care | Source: Ambulatory Visit | Attending: Radiation Oncology | Admitting: Radiation Oncology

## 2024-02-21 ENCOUNTER — Other Ambulatory Visit: Payer: Self-pay

## 2024-02-21 DIAGNOSIS — Z510 Encounter for antineoplastic radiation therapy: Secondary | ICD-10-CM | POA: Insufficient documentation

## 2024-02-21 DIAGNOSIS — C342 Malignant neoplasm of middle lobe, bronchus or lung: Secondary | ICD-10-CM | POA: Diagnosis not present

## 2024-02-21 LAB — RAD ONC ARIA SESSION SUMMARY
Course Elapsed Days: 2
Plan Fractions Treated to Date: 2
Plan Prescribed Dose Per Fraction: 12 Gy
Plan Total Fractions Prescribed: 5
Plan Total Prescribed Dose: 60 Gy
Reference Point Dosage Given to Date: 24 Gy
Reference Point Session Dosage Given: 12 Gy
Session Number: 2

## 2024-02-24 ENCOUNTER — Ambulatory Visit: Admitting: Radiation Oncology

## 2024-02-24 ENCOUNTER — Other Ambulatory Visit: Payer: Self-pay

## 2024-02-25 ENCOUNTER — Ambulatory Visit: Admitting: Radiation Oncology

## 2024-02-25 ENCOUNTER — Other Ambulatory Visit: Payer: Self-pay

## 2024-02-25 DIAGNOSIS — C342 Malignant neoplasm of middle lobe, bronchus or lung: Secondary | ICD-10-CM

## 2024-02-26 ENCOUNTER — Other Ambulatory Visit: Payer: Self-pay

## 2024-02-26 ENCOUNTER — Ambulatory Visit
Admission: RE | Admit: 2024-02-26 | Discharge: 2024-02-26 | Disposition: A | Discharge status: Home / Self Care | Source: Ambulatory Visit | Attending: Radiation Oncology | Admitting: Radiation Oncology

## 2024-02-26 DIAGNOSIS — Z510 Encounter for antineoplastic radiation therapy: Secondary | ICD-10-CM | POA: Diagnosis not present

## 2024-02-26 DIAGNOSIS — C342 Malignant neoplasm of middle lobe, bronchus or lung: Secondary | ICD-10-CM | POA: Diagnosis not present

## 2024-02-26 LAB — RAD ONC ARIA SESSION SUMMARY
Course Elapsed Days: 7
Plan Fractions Treated to Date: 3
Plan Prescribed Dose Per Fraction: 12 Gy
Plan Total Fractions Prescribed: 5
Plan Total Prescribed Dose: 60 Gy
Reference Point Dosage Given to Date: 36 Gy
Reference Point Session Dosage Given: 12 Gy
Session Number: 3

## 2024-02-26 LAB — FUNGUS CULTURE RESULT

## 2024-02-26 LAB — FUNGUS CULTURE WITH STAIN

## 2024-02-26 LAB — FUNGAL ORGANISM REFLEX

## 2024-02-28 ENCOUNTER — Ambulatory Visit
Admission: RE | Admit: 2024-02-28 | Discharge: 2024-02-28 | Disposition: A | Discharge status: Home / Self Care | Source: Ambulatory Visit | Attending: Radiation Oncology | Admitting: Radiation Oncology

## 2024-02-28 ENCOUNTER — Other Ambulatory Visit: Payer: Self-pay

## 2024-02-28 DIAGNOSIS — C342 Malignant neoplasm of middle lobe, bronchus or lung: Secondary | ICD-10-CM | POA: Diagnosis not present

## 2024-02-28 DIAGNOSIS — Z510 Encounter for antineoplastic radiation therapy: Secondary | ICD-10-CM | POA: Diagnosis not present

## 2024-02-28 LAB — RAD ONC ARIA SESSION SUMMARY
Course Elapsed Days: 9
Plan Fractions Treated to Date: 4
Plan Prescribed Dose Per Fraction: 12 Gy
Plan Total Fractions Prescribed: 5
Plan Total Prescribed Dose: 60 Gy
Reference Point Dosage Given to Date: 48 Gy
Reference Point Session Dosage Given: 12 Gy
Session Number: 4

## 2024-03-02 ENCOUNTER — Other Ambulatory Visit: Payer: Self-pay | Admitting: Family Medicine

## 2024-03-02 ENCOUNTER — Other Ambulatory Visit: Payer: Self-pay | Admitting: Radiation Oncology

## 2024-03-02 ENCOUNTER — Ambulatory Visit
Admission: RE | Admit: 2024-03-02 | Discharge: 2024-03-02 | Disposition: A | Discharge status: Home / Self Care | Source: Ambulatory Visit | Attending: Radiation Oncology | Admitting: Radiation Oncology

## 2024-03-02 ENCOUNTER — Other Ambulatory Visit: Payer: Self-pay

## 2024-03-02 DIAGNOSIS — Z87891 Personal history of nicotine dependence: Secondary | ICD-10-CM | POA: Diagnosis not present

## 2024-03-02 DIAGNOSIS — Z510 Encounter for antineoplastic radiation therapy: Secondary | ICD-10-CM | POA: Diagnosis not present

## 2024-03-02 DIAGNOSIS — C342 Malignant neoplasm of middle lobe, bronchus or lung: Secondary | ICD-10-CM | POA: Diagnosis not present

## 2024-03-02 LAB — RAD ONC ARIA SESSION SUMMARY
Course Elapsed Days: 12
Plan Fractions Treated to Date: 5
Plan Prescribed Dose Per Fraction: 12 Gy
Plan Total Fractions Prescribed: 5
Plan Total Prescribed Dose: 60 Gy
Reference Point Dosage Given to Date: 60 Gy
Reference Point Session Dosage Given: 12 Gy
Session Number: 5

## 2024-03-02 MED ORDER — ONDANSETRON 8 MG PO TBDP: 8.0000 mg | ORAL_TABLET | Freq: Three times a day (TID) | ORAL | 0 refills | Status: DC | PRN

## 2024-03-03 NOTE — Radiation Completion Notes (Signed)
 Patient Name: Kim Singh, Kim Singh MRN: [id number] Date of Birth: [birth year] Referring Physician: LAMAR CHRIS, M.D. Date of Service: 2024-03-03 Radiation Oncologist: Lauraine Golden, M.D. [facility]                             RADIATION ONCOLOGY END OF TREATMENT NOTE     Diagnosis: C34.2 Malignant neoplasm of middle lobe, bronchus or lung Staging on 2024-02-07: Non-small cell cancer of middle lobe of right lung (HCC) T=cT2, N=cN0, M=cM0 Intent: Curative     ==========DELIVERED PLANS==========  First Treatment Date: 2024-02-19 Last Treatment Date: 2024-03-02   Plan Name: Lung_R_SBRT Site: Lung, Right Technique: SBRT/SRT-IMRT Mode: Photon Dose Per Fraction: 12 Gy Prescribed Dose (Delivered / Prescribed): 60 Gy / 60 Gy Prescribed Fxs (Delivered / Prescribed): 5 / 5     ==========ON TREATMENT VISIT DATES========== 2024-02-19, 2024-02-19, 2024-02-21, 2024-02-26, 2024-02-28, 2024-03-02, 2024-03-02     ==========UPCOMING VISITS==========       ==========APPENDIX - ON TREATMENT VISIT NOTES==========   See weekly On Treatment Notes in Epic for details in the Media tab (listed as Progress notes on the On Treatment Visit Dates listed above).

## 2024-03-09 LAB — ACID FAST CULTURE WITH REFLEXED SENSITIVITIES (MYCOBACTERIA): Acid Fast Culture: NEGATIVE

## 2024-03-26 ENCOUNTER — Telehealth: Payer: Self-pay | Admitting: *Deleted

## 2024-03-26 NOTE — Telephone Encounter (Signed)
 CALLED PATIENT TO OF CT FOR 06-03-24- ARRIVAL TIME - 12:15 PM @ [facility], NO RESTRICTIONS TO SCAN, PATIENT TO RECEIVE RESULTS FROM PA ELLIE MUIR ON 06-09-24 @ 3:30 PM, SPOKE WITH PATIENT AND SHE IS AWARE OF THESE APPTS.

## 2024-04-15 ENCOUNTER — Other Ambulatory Visit: Payer: Self-pay | Admitting: Family Medicine

## 2024-04-15 MED ORDER — NADOLOL 20 MG PO TABS: 20.0000 mg | ORAL_TABLET | Freq: Every day | ORAL | 3 refills | Status: AC

## 2024-04-15 NOTE — Telephone Encounter (Signed)
 Copied from CRM #[id number]. Topic: Clinical - Medication Refill >> Apr 15, 2024  1:01 PM Thersia C wrote: Medication: nadolol  (CORGARD ) 20 MG tablet  Has the patient contacted their pharmacy? Yes (Agent: If no, request that the patient contact the pharmacy for the refill. If patient does not wish to contact the pharmacy document the reason why and proceed with request.) (Agent: If yes, when and what did the pharmacy advise?)  This is the patient's preferred pharmacy:  [id number] [address], [place] - [address] AT [facility] OF [address] & [facility] [address] [place] [address] Phone: [phone] Fax: [phone]  Is this the correct pharmacy for this prescription? Yes If no, delete pharmacy and type the correct one.   Has the prescription been filled recently? No  Is the patient out of the medication? Yes  Has the patient been seen for an appointment in the last year OR does the patient have an upcoming appointment? Yes  Can we respond through MyChart? Yes  Agent: Please be advised that Rx refills may take up to 3 business days. We ask that you follow-up with your pharmacy.

## 2024-06-03 ENCOUNTER — Ambulatory Visit (HOSPITAL_COMMUNITY)
Admission: RE | Admit: 2024-06-03 | Discharge: 2024-06-03 | Disposition: A | Discharge status: Home / Self Care | Source: Ambulatory Visit | Attending: Radiation Oncology | Admitting: Radiation Oncology

## 2024-06-03 DIAGNOSIS — I700 Atherosclerosis of aorta: Secondary | ICD-10-CM | POA: Diagnosis not present

## 2024-06-03 DIAGNOSIS — C342 Malignant neoplasm of middle lobe, bronchus or lung: Secondary | ICD-10-CM | POA: Insufficient documentation

## 2024-06-03 DIAGNOSIS — J432 Centrilobular emphysema: Secondary | ICD-10-CM | POA: Diagnosis not present

## 2024-06-03 DIAGNOSIS — I7121 Aneurysm of the ascending aorta, without rupture: Secondary | ICD-10-CM | POA: Diagnosis not present

## 2024-06-03 DIAGNOSIS — C3490 Malignant neoplasm of unspecified part of unspecified bronchus or lung: Secondary | ICD-10-CM | POA: Diagnosis not present

## 2024-06-08 NOTE — Progress Notes (Signed)
 Radiation Oncology         [phone] ________________________________  Name: Kim Singh MRN: [id number]  Date: 06/09/2024  DOB: May 07, 1941  Follow-Up Visit Note  Outpatient  CC: Katrinka Garnette KIDD, MD  Shelah Lamar RAMAN, MD  Diagnosis and Prior Radiotherapy: No diagnosis found. ***   Cancer Staging  Non-small cell cancer of middle lobe of right lung [facility]) Staging form: Lung, AJCC V9 - Clinical stage from 02/07/2024: cT2, cN0, cM0 - Signed by Izell Domino, MD on 02/07/2024 Laterality: Right  ==========DELIVERED PLANS==========  First Treatment Date: 2024-02-19 Last Treatment Date: 2024-03-02   Plan Name: Lung_R_SBRT Site: Lung, Right Technique: SBRT/SRT-IMRT Mode: Photon Dose Per Fraction: 12 Gy Prescribed Dose (Delivered / Prescribed): 60 Gy / 60 Gy Prescribed Fxs (Delivered / Prescribed): 5 / 5  Stage IB (cT2, N0, M0) Adenocarcinoma, NSCLC, of the RUL; s/p SBRT completed on 03/02/2024  (Prior history of breast cancer and thyroid  cancer - BC diagnosed in 2001 s/p lumpectomy, chemo, and XRT)   CHIEF COMPLAINT: Here for follow-up and surveillance of NSCLC cancer  Narrative:  The patient returns today for routine follow-up and to review most recent imaging.  She completed her treatment approximately 3 months ago.                              CT of the chest on 06/03/2024 demonstrated a masslike consolidation in the treated right middle lobe, stable in size but is no longer cavitary; a 4.2 cm ascending aortic aneurysm, stable; aortic atherosclerosis; and enlarged pulmonary trunk.  ***  ALLERGIES:  is allergic to codeine.  Meds: Current Outpatient Medications  Medication Sig Dispense Refill   albuterol  (VENTOLIN  HFA) 108 (90 Base) MCG/ACT inhaler Inhale 2 puffs into the lungs every 6 (six) hours as needed for wheezing or shortness of breath. 8 g 0   albuterol  (VENTOLIN  HFA) 108 (90 Base) MCG/ACT inhaler Inhale 2 puffs into the lungs every 6 (six) hours as needed for  wheezing or shortness of breath. 8 g 2   aspirin  EC 81 MG tablet Take 1 tablet (81 mg total) by mouth daily. Swallow whole.     busPIRone  (BUSPAR ) 5 MG tablet Take 1 tablet (5 mg total) by mouth 2 (two) times daily. 180 tablet 3   CALCIUM -VITAMIN D PO Take 1 tablet by mouth 2 (two) times daily.      cetirizine  (ZYRTEC ) 5 MG tablet TAKE 1 TABLET DAILY 90 tablet 3   escitalopram  (LEXAPRO ) 10 MG tablet Take 1 tablet (10 mg total) by mouth daily. 90 tablet 3   Multiple Vitamin (MULTIVITAMIN) tablet Take 1 tablet by mouth daily.     nadolol  (CORGARD ) 20 MG tablet Take 1 tablet (20 mg total) by mouth daily. 90 tablet 3   nitroGLYCERIN  (NITROSTAT ) 0.4 MG SL tablet Place 1 tablet (0.4 mg total) under the tongue every 5 (five) minutes as needed for chest pain. 30 tablet 3   Omega-3-acid Ethyl Esters (OMACOR PO) Take 1 tablet by mouth 2 (two) times daily.      ondansetron  (ZOFRAN -ODT) 8 MG disintegrating tablet Take 1 tablet (8 mg total) by mouth every 8 (eight) hours as needed for nausea or vomiting. 20 tablet 0   rosuvastatin  (CRESTOR ) 10 MG tablet TAKE 1 TABLET DAILY 90 tablet 3   Spacer/Aero-Hold Chamber Mask MISC Spacer for inhaler 1 each 0   Spacer/Aero-Holding Chambers (AEROCHAMBER PLUS) Device Please give one spacer 1 each 1  SYNTHROID  100 MCG tablet TAKE 1 TABLET DAILY 90 tablet 3   valACYclovir  (VALTREX ) 1000 MG tablet Take 2 pills twice a day for 1 day at first sign of cold sore 28 tablet 1   vitamin B-12 (CYANOCOBALAMIN ) 1000 MCG tablet Take 1,000 mcg by mouth once a week. Takes on Mondays     No current facility-administered medications for this visit.    Physical Findings: The patient is in no acute distress. Patient is alert and oriented.  vitals were not taken for this visit. .      Lab Findings: Lab Results  Component Value Date   WBC 5.8 01/27/2024   HGB 14.6 01/27/2024   HCT 43.7 01/27/2024   MCV 96.3 01/27/2024   PLT 172 01/27/2024    Radiographic Findings: CT Chest Wo  Contrast Result Date: 06/08/2024 CLINICAL DATA:  Non-small cell lung cancer.  * Tracking Code: BO * EXAM: CT CHEST WITHOUT CONTRAST TECHNIQUE: Multidetector CT imaging of the chest was performed following the standard protocol without IV contrast. RADIATION DOSE REDUCTION: This exam was performed according to the departmental dose-optimization program which includes automated exposure control, adjustment of the mA and/or kV according to patient size and/or use of iterative reconstruction technique. COMPARISON:  12/05/2023. FINDINGS: Cardiovascular: Atherosclerotic calcification of the aorta and coronary arteries. Ascending aorta measures 4.2 cm (coronal image 55), unchanged. Enlarged pulmonic trunk and heart. No pericardial effusion. Mediastinum/Nodes: Thoracic inlet lymph nodes are not enlarged by CT size criteria. No pathologically enlarged mediastinal or axillary lymph nodes. Hilar regions are difficult to definitively evaluate without IV contrast. Postsurgical changes in the left breast. Air in the esophagus can be seen with dysmotility. Lungs/Pleura: Centrilobular emphysema. Masslike consolidation in the lateral segment right middle lobe is stable in size, measuring 2.6 x 3.1 cm (6/103), but is no longer cavitary. Subsegmental atelectasis in the right middle and right lower lobes. Additional tiny pulmonary nodules are unchanged. Subpleural radiation scarring in the anterior left upper lobe. No pleural fluid. Airway is unremarkable. Upper Abdomen: Elevated right hemidiaphragm. Visualized portions of the liver, gallbladder, adrenal glands, kidneys, spleen, pancreas, stomach and bowel are grossly unremarkable. No upper abdominal adenopathy. Musculoskeletal: Degenerative changes in the spine. Osteopenia. No worrisome lytic or sclerotic lesions. IMPRESSION: 1. Masslike consolidation in the lateral segment right middle lobe is stable in size but is no longer cavitary when compared with 12/05/2023. Findings may be  due to evolving radiation therapy. Recommend close follow-up in 2-3 months, as clinically indicated, as disease progression cannot be definitively excluded. 2. 4.2 cm ascending aortic aneurysm, stable. This can be reassessed on follow-up oncologic imaging. 3. Aortic atherosclerosis (ICD10-I70.0). Coronary artery calcification. 4. Enlarged pulmonic trunk, indicative of pulmonary arterial hypertension. 5.  Emphysema (ICD10-J43.9). Electronically Signed   By: Newell Eke M.D.   On: 06/08/2024 11:13    Impression/Plan:  Stage IB (cT2, N0, M0) Adenocarcinoma, NSCLC, of the RUL; s/p SBRT completed on 03/02/2024  Patient is doing well overall today. ***  We personally reviewed her imaging which demonstrates the treated right upper lobe nodule to be stable in size however it is no longer cavitary when compared to previous imaging.  Close follow-up was recommended. Per Dr. Izell, we will get a follow-up CT of the chest in 3 months. ***   On date of service, in total, I spent *** minutes on this encounter. Patient was seen in person.  _____________________________________    Leeroy Due, PA-C

## 2024-06-09 ENCOUNTER — Encounter: Payer: Self-pay | Admitting: Radiology

## 2024-06-09 ENCOUNTER — Ambulatory Visit
Admission: RE | Admit: 2024-06-09 | Discharge: 2024-06-09 | Disposition: A | Discharge status: Home / Self Care | Payer: Self-pay | Source: Ambulatory Visit | Attending: Radiology | Admitting: Radiology

## 2024-06-09 VITALS — BP 130/69 | HR 62 | Temp 97.1°F | Resp 18 | Ht 68.0 in | Wt 157.4 lb

## 2024-06-09 DIAGNOSIS — R911 Solitary pulmonary nodule: Secondary | ICD-10-CM | POA: Diagnosis not present

## 2024-06-09 NOTE — Progress Notes (Signed)
 Kim Singh is here today for follow up post radiation to the lung.  Lung Side: Right Lung  Does the patient complain of any of the following: Pain:No Shortness of breath w/wo exertion: No Cough: No Hemoptysis:  Pain with swallowing: Yes Swallowing/choking concerns: yes choking on foods at times.  Appetite: good  Energy Level: Fair  Post radiation skin Changes: No    Additional comments if applicable: BP 130/69 (BP Location: Right Arm, Patient Position: Sitting)   Pulse 62   Temp (!) 97.1 F (36.2 C) (Temporal)   Resp 18   Ht 5' 8 (1.727 m)   Wt 157 lb 6 oz (71.4 kg)   SpO2 96%   BMI 23.93 kg/m

## 2024-06-25 ENCOUNTER — Ambulatory Visit

## 2024-06-25 VITALS — BP 110/68 | HR 60 | Temp 97.8°F | Ht 68.0 in | Wt 157.0 lb

## 2024-06-25 DIAGNOSIS — Z0000 Encounter for general adult medical examination without abnormal findings: Secondary | ICD-10-CM

## 2024-06-25 NOTE — Progress Notes (Signed)
 Chief Complaint  Patient presents with   Medicare Wellness     Subjective:   Kim Singh is a 83 y.o. female who presents for a Medicare Annual Wellness Visit.  Visit info / Clinical Intake: Medicare Wellness Visit Type:: Subsequent Annual Wellness Visit Persons participating in visit and providing information:: patient Medicare Wellness Visit Mode:: In-person (required for WTM) Interpreter Needed?: No Pre-visit prep was completed: yes AWV questionnaire completed by patient prior to visit?: no Living arrangements:: with family/others (daughter) Patient's Overall Health Status Rating: very good Typical amount of pain: none Does pain affect daily life?: no Are you currently prescribed opioids?: no  Dietary Habits and Nutritional Risks How many meals a day?: 3 Eats fruit and vegetables daily?: yes Most meals are obtained by: preparing own meals; eating out In the last 2 weeks, have you had any of the following?: none Diabetic:: (!) yes Any non-healing wounds?: no How often do you check your BS?: 0 Would you like to be referred to a Nutritionist or for Diabetic Management? : no  Functional Status Ambulation: Independent with device- listed below Home Assistive Devices/Equipment: Eyeglasses Medication Administration: Independent Home Management (perform basic housework or laundry): Independent Manage your own finances?: yes Primary transportation is: driving Concerns about vision?: no *vision screening is required for WTM* Concerns about hearing?: (!) yes Uses hearing aids?: (!) yes Hear whispered voice?: yes  Fall Screening Falls in the past year?: 1 Number of falls in past year: 1 Was there an injury with Fall?: 1 Fall Risk Category Calculator: 3 Patient Fall Risk Level: High Fall Risk  Fall Risk Patient at Risk for Falls Due to: History of fall(s); Impaired balance/gait (missed step getting into car) Fall risk Follow up: Falls prevention discussed  Home and  Transportation Safety: All rugs have non-skid backing?: N/A, no rugs All stairs or steps have railings?: yes Grab bars in the bathtub or shower?: (!) no Have non-skid surface in bathtub or shower?: yes Good home lighting?: yes Regular seat belt use?: yes Hospital stays in the last year:: no  Cognitive Assessment Difficulty concentrating, remembering, or making decisions? : no Will 6CIT or Mini Cog be Completed: yes What year is it?: 0 points What month is it?: 0 points Give patient an address phrase to remember (5 components): [address] [place]  About what time is it?: 0 points Count backwards from 20 to 1: 0 points Say the months of the year in reverse: 0 points Repeat the address phrase from earlier: 0 points 6 CIT Score: 0 points  Advance Directives (For Healthcare) Does Patient Have a Medical Advance Directive?: Yes Type of Advance Directive: Healthcare Power of Attorney Copy of Healthcare Power of Attorney in Chart?: Yes - validated most recent copy scanned in chart (See row information)  Reviewed/Updated  Reviewed/Updated: Reviewed All (Medical, Surgical, Family, Medications, Allergies, Care Teams, Patient Goals)    Allergies (verified) Codeine   Current Medications (verified) Outpatient Encounter Medications as of 06/25/2024  Medication Sig   aspirin  EC 81 MG tablet Take 1 tablet (81 mg total) by mouth daily. Swallow whole.   CALCIUM -VITAMIN D PO Take 1 tablet by mouth 2 (two) times daily.    cetirizine  (ZYRTEC ) 5 MG tablet TAKE 1 TABLET DAILY   nitroGLYCERIN  (NITROSTAT ) 0.4 MG SL tablet Place 1 tablet (0.4 mg total) under the tongue every 5 (five) minutes as needed for chest pain.   ondansetron  (ZOFRAN -ODT) 8 MG disintegrating tablet Take 1 tablet (8 mg total) by mouth every 8 (  eight) hours as needed for nausea or vomiting.   rosuvastatin  (CRESTOR ) 10 MG tablet TAKE 1 TABLET DAILY   SYNTHROID  100 MCG tablet TAKE 1 TABLET DAILY   vitamin B-12 (CYANOCOBALAMIN )  1000 MCG tablet Take 1,000 mcg by mouth once a week. Takes on Mondays   albuterol  (VENTOLIN  HFA) 108 (90 Base) MCG/ACT inhaler Inhale 2 puffs into the lungs every 6 (six) hours as needed for wheezing or shortness of breath. (Patient not taking: Reported on 06/25/2024)   albuterol  (VENTOLIN  HFA) 108 (90 Base) MCG/ACT inhaler Inhale 2 puffs into the lungs every 6 (six) hours as needed for wheezing or shortness of breath.   busPIRone  (BUSPAR ) 5 MG tablet Take 1 tablet (5 mg total) by mouth 2 (two) times daily. (Patient not taking: Reported on 06/25/2024)   escitalopram  (LEXAPRO ) 10 MG tablet Take 1 tablet (10 mg total) by mouth daily.   Multiple Vitamin (MULTIVITAMIN) tablet Take 1 tablet by mouth daily.   nadolol  (CORGARD ) 20 MG tablet Take 1 tablet (20 mg total) by mouth daily. (Patient not taking: Reported on 06/25/2024)   Omega-3-acid Ethyl Esters (OMACOR PO) Take 1 tablet by mouth 2 (two) times daily.    Spacer/Aero-Hold Chamber Mask MISC Spacer for inhaler   Spacer/Aero-Holding Chambers (AEROCHAMBER PLUS) Device Please give one spacer   valACYclovir  (VALTREX ) 1000 MG tablet Take 2 pills twice a day for 1 day at first sign of cold sore (Patient not taking: Reported on 06/25/2024)   No facility-administered encounter medications on file as of 06/25/2024.    History: Past Medical History:  Diagnosis Date   Breast cancer (HCC) 2001   Left   Diabetes mellitus type 2, uncomplicated (HCC)    Hypothyroidism    Lung cancer (HCC)    Neuropathy    feet and hands   Personal history of chemotherapy    Personal history of radiation therapy    Pure hypercholesterolemia    Thyroid  cancer (HCC)    Unspecified disorder of thyroid     Unspecified essential hypertension    Past Surgical History:  Procedure Laterality Date   BREAST EXCISIONAL BIOPSY Left    BREAST LUMPECTOMY Left 2001   BRONCHIAL BIOPSY  01/27/2024   Procedure: BRONCHOSCOPY, WITH BIOPSY;  Surgeon: Shelah Lamar RAMAN, MD;  Location: [facility]  ENDOSCOPY;  Service: Pulmonary;;   BRONCHIAL BRUSHINGS  01/27/2024   Procedure: BRONCHOSCOPY, WITH BRUSH BIOPSY;  Surgeon: Shelah Lamar RAMAN, MD;  Location: [facility] ENDOSCOPY;  Service: Pulmonary;;   BRONCHIAL NEEDLE ASPIRATION BIOPSY  01/27/2024   Procedure: BRONCHOSCOPY, WITH NEEDLE ASPIRATION BIOPSY;  Surgeon: Shelah Lamar RAMAN, MD;  Location: [facility] ENDOSCOPY;  Service: Pulmonary;;   BRONCHIAL WASHINGS  01/27/2024   Procedure: IRRIGATION, BRONCHUS;  Surgeon: Shelah Lamar RAMAN, MD;  Location: [facility] ENDOSCOPY;  Service: Pulmonary;;   FIDUCIAL MARKER PLACEMENT  01/27/2024   Procedure: INSERTION, FIDUCIAL MARKERS;  Surgeon: Shelah Lamar RAMAN, MD;  Location: [facility] ENDOSCOPY;  Service: Pulmonary;;   THYROIDECTOMY     total   VIDEO BRONCHOSCOPY WITH ENDOBRONCHIAL NAVIGATION Right 01/27/2024   Procedure: VIDEO BRONCHOSCOPY WITH ENDOBRONCHIAL NAVIGATION;  Surgeon: Shelah Lamar RAMAN, MD;  Location: [facility] ENDOSCOPY;  Service: Pulmonary;  Laterality: Right;   Family History  Problem Relation Age of Onset   Diabetes Mother    Hearing loss Father    Cancer Sister        Bile Duct Cancer   Depression Son    Social History   Occupational History   Occupation: Retired  Tobacco Use   Smoking  status: Former    Current packs/day: 0.00    Average packs/day: 1 pack/day for 41.0 years (41.0 ttl pk-yrs)    Types: Cigarettes    Start date: 08/02/1956    Quit date: 08/02/1997    Years since quitting: 26.9   Smokeless tobacco: Never  Vaping Use   Vaping status: Never Used  Substance and Sexual Activity   Alcohol use: Yes    Alcohol/week: 7.0 standard drinks of alcohol    Types: 7 Glasses of wine per week    Comment: glass of wine every day   Drug use: No   Sexual activity: Not Currently   Tobacco Counseling Counseling given: Not Answered  SDOH Screenings   Food Insecurity: No Food Insecurity (06/25/2024)  Housing: Unknown (06/25/2024)  Transportation Needs: No Transportation Needs (06/25/2024)  Utilities: Not At Risk (06/25/2024)   Alcohol Screen: Low Risk  (04/19/2021)  Depression (PHQ2-9): Low Risk  (06/25/2024)  Financial Resource Strain: Low Risk  (05/30/2023)  Physical Activity: Inactive (06/25/2024)  Social Connections: Socially Isolated (06/25/2024)  Stress: No Stress Concern Present (06/25/2024)  Tobacco Use: Medium Risk (06/25/2024)  Health Literacy: Adequate Health Literacy (06/25/2024)   See flowsheets for full screening details  Depression Screen PHQ 2 & 9 Depression Scale- Over the past 2 weeks, how often have you been bothered by any of the following problems? Little interest or pleasure in doing things: 0 (Hell of a year) Feeling down, depressed, or hopeless (PHQ Adolescent also includes...irritable): 1 PHQ-2 Total Score: 1 Trouble falling or staying asleep, or sleeping too much: 0 Feeling tired or having little energy: 0 Poor appetite or overeating (PHQ Adolescent also includes...weight loss): 0 Feeling bad about yourself - or that you are a failure or have let yourself or your family down: 0 Trouble concentrating on things, such as reading the newspaper or watching television (PHQ Adolescent also includes...like school work): 0 Moving or speaking so slowly that other people could have noticed. Or the opposite - being so fidgety or restless that you have been moving around a lot more than usual: 0 Thoughts that you would be better off dead, or of hurting yourself in some way: 0 PHQ-9 Total Score: 0 If you checked off any problems, how difficult have these problems made it for you to do your work, take care of things at home, or get along with other people?: Not difficult at all  Depression Treatment Depression Interventions/Treatment : EYV7-0 Score <4 Follow-up Not Indicated     Goals Addressed               This Visit's Progress     maintain health and health (pt-stated)               Objective:    Today's Vitals   06/25/24 1342  BP: 110/68  Pulse: 60  Temp: 97.8 F (36.6 C)   SpO2: 92%  Weight: 157 lb (71.2 kg)  Height: 5' 8 (1.727 m)   Body mass index is 23.87 kg/m.  Hearing/Vision screen Hearing Screening - Comments:: Hearing aids  Vision Screening - Comments:: Wears rx glasses - up to date with routine eye exams with Dr Lauraine Fass  Immunizations and Health Maintenance Health Maintenance  Topic Date Due   Diabetic kidney evaluation - Urine ACR  Never done   Zoster Vaccines- Shingrix (1 of 2) Never done   COVID-19 Vaccine (4 - 2025-26 season) 03/23/2024   Influenza Vaccine  10/20/2024 (Originally 02/21/2024)   OPHTHALMOLOGY EXAM  07/11/2024  FOOT EXAM  08/12/2024   HEMOGLOBIN A1C  08/13/2024   Diabetic kidney evaluation - eGFR measurement  01/26/2025   Medicare Annual Wellness (AWV)  06/25/2025   DTaP/Tdap/Td (2 - Td or Tdap) 04/17/2026   Pneumococcal Vaccine: 50+ Years  Completed   Bone Density Scan  Completed   Meningococcal B Vaccine  Aged Out   Mammogram  Discontinued        Assessment/Plan:  This is a routine wellness examination for Kim Singh.  Patient Care Team: Katrinka Garnette KIDD, MD as PCP - General (Family Medicine) Elmira Newman PARAS, MD as PCP - Cardiology (Cardiology) Lita Lye, OD as Consulting Physician (Optometry) Ethyl Lonni BRAVO, MD (Inactive) as Consulting Physician (Otolaryngology)  I have personally reviewed and noted the following in the patient's chart:   Medical and social history Use of alcohol, tobacco or illicit drugs  Current medications and supplements including opioid prescriptions. Functional ability and status Nutritional status Physical activity Advanced directives List of other physicians Hospitalizations, surgeries, and ER visits in previous 12 months Vitals Screenings to include cognitive, depression, and falls Referrals and appointments  No orders of the defined types were placed in this encounter.  In addition, I have reviewed and discussed with patient certain preventive protocols,  quality metrics, and best practice recommendations. A written personalized care plan for preventive services as well as general preventive health recommendations were provided to patient.   Ellouise VEAR Haws, LPN   [id number]/11/7972   Return in 1 year (on 06/30/2025).  After Visit Summary: (In Person-Printed) AVS printed and given to the patient  Nurse Notes: none

## 2024-06-25 NOTE — Patient Instructions (Signed)
 Kim Singh,  Thank you for taking the time for your Medicare Wellness Visit. I appreciate your continued commitment to your health goals. Please review the care plan we discussed, and feel free to reach out if I can assist you further.  Please note that Annual Wellness Visits do not include a physical exam. Some assessments may be limited, especially if the visit was conducted virtually. If needed, we may recommend an in-person follow-up with your provider.  Ongoing Care Seeing your primary care provider every 3 to 6 months helps us  monitor your health and provide consistent, personalized care.   Referrals If a referral was made during today's visit and you haven't received any updates within two weeks, please contact the referred provider directly to check on the status.  Recommended Screenings:  Health Maintenance  Topic Date Due   Yearly kidney health urinalysis for diabetes  Never done   Zoster (Shingles) Vaccine (1 of 2) Never done   Flu Shot  02/21/2024   COVID-19 Vaccine (4 - 2025-26 season) 03/23/2024   Eye exam for diabetics  07/11/2024   Complete foot exam   08/12/2024   Hemoglobin A1C  08/13/2024   Yearly kidney function blood test for diabetes  01/26/2025   Medicare Annual Wellness Visit  06/25/2025   DTaP/Tdap/Td vaccine (2 - Td or Tdap) 04/17/2026   Pneumococcal Vaccine for age over 91  Completed   Osteoporosis screening with Bone Density Scan  Completed   Meningitis B Vaccine  Aged Out   Breast Cancer Screening  Discontinued       02/07/2024    3:30 PM  Advanced Directives  Does Patient Have a Medical Advance Directive? Yes  Type of Advance Directive Healthcare Power of Attorney    Vision: Annual vision screenings are recommended for early detection of glaucoma, cataracts, and diabetic retinopathy. These exams can also reveal signs of chronic conditions such as diabetes and high blood pressure.  Dental: Annual dental screenings help detect early signs of oral  cancer, gum disease, and other conditions linked to overall health, including heart disease and diabetes.  Please see the attached documents for additional preventive care recommendations.

## 2024-06-29 ENCOUNTER — Other Ambulatory Visit: Payer: Self-pay

## 2024-06-29 ENCOUNTER — Telehealth: Payer: Self-pay | Admitting: Family Medicine

## 2024-06-29 DIAGNOSIS — E039 Hypothyroidism, unspecified: Secondary | ICD-10-CM

## 2024-06-29 MED ORDER — LEVOTHYROXINE SODIUM 100 MCG PO TABS: 100.0000 ug | ORAL_TABLET | Freq: Every day | ORAL | 0 refills | Status: DC

## 2024-06-29 NOTE — Telephone Encounter (Signed)
 Spoke with patient and 30 pills sent to [facility] for her to pick up because [facility] takes [facility] and she is out of pills.   Copied from CRM [id number]. Topic: General - Other >> Jun 29, 2024 12:25 PM Rosina BIRCH wrote: Reason for CRM: patient called stating she need her synthroid  medication  because [facility] must have forgot to send them and she need at least 15 pills. She is waiting on them [phone]

## 2024-07-14 LAB — OPHTHALMOLOGY REPORT-SCANNED

## 2024-08-07 ENCOUNTER — Other Ambulatory Visit: Payer: Self-pay | Admitting: Family Medicine

## 2024-08-13 ENCOUNTER — Ambulatory Visit: Admitting: Family Medicine

## 2024-08-21 ENCOUNTER — Encounter: Payer: Self-pay | Admitting: Family Medicine

## 2024-08-21 ENCOUNTER — Ambulatory Visit: Admitting: Family Medicine

## 2024-08-21 ENCOUNTER — Ambulatory Visit: Payer: Self-pay | Admitting: Family Medicine

## 2024-08-21 VITALS — BP 122/72 | HR 68 | Temp 97.7°F | Resp 14 | Ht 68.0 in | Wt 154.6 lb

## 2024-08-21 DIAGNOSIS — E1140 Type 2 diabetes mellitus with diabetic neuropathy, unspecified: Secondary | ICD-10-CM | POA: Diagnosis not present

## 2024-08-21 DIAGNOSIS — E039 Hypothyroidism, unspecified: Secondary | ICD-10-CM | POA: Diagnosis not present

## 2024-08-21 DIAGNOSIS — R809 Proteinuria, unspecified: Secondary | ICD-10-CM | POA: Diagnosis not present

## 2024-08-21 DIAGNOSIS — K5900 Constipation, unspecified: Secondary | ICD-10-CM

## 2024-08-21 DIAGNOSIS — C342 Malignant neoplasm of middle lobe, bronchus or lung: Secondary | ICD-10-CM | POA: Diagnosis not present

## 2024-08-21 DIAGNOSIS — R1310 Dysphagia, unspecified: Secondary | ICD-10-CM

## 2024-08-21 DIAGNOSIS — E1149 Type 2 diabetes mellitus with other diabetic neurological complication: Secondary | ICD-10-CM

## 2024-08-21 DIAGNOSIS — F324 Major depressive disorder, single episode, in partial remission: Secondary | ICD-10-CM | POA: Diagnosis not present

## 2024-08-21 DIAGNOSIS — Z780 Asymptomatic menopausal state: Secondary | ICD-10-CM

## 2024-08-21 DIAGNOSIS — J432 Centrilobular emphysema: Secondary | ICD-10-CM

## 2024-08-21 DIAGNOSIS — E1129 Type 2 diabetes mellitus with other diabetic kidney complication: Secondary | ICD-10-CM | POA: Diagnosis not present

## 2024-08-21 LAB — CBC WITH DIFFERENTIAL/PLATELET
Basophils Absolute: 0.1 10*3/uL (ref 0.0–0.1)
Basophils Relative: 0.8 % (ref 0.0–3.0)
Eosinophils Absolute: 0.3 10*3/uL (ref 0.0–0.7)
Eosinophils Relative: 3.6 % (ref 0.0–5.0)
HCT: 42.7 % (ref 36.0–46.0)
Hemoglobin: 14.4 g/dL (ref 12.0–15.0)
Lymphocytes Relative: 16.9 % (ref 12.0–46.0)
Lymphs Abs: 1.2 10*3/uL (ref 0.7–4.0)
MCHC: 33.7 g/dL (ref 30.0–36.0)
MCV: 95.2 fl (ref 78.0–100.0)
Monocytes Absolute: 1 10*3/uL (ref 0.1–1.0)
Monocytes Relative: 13.6 % — ABNORMAL HIGH (ref 3.0–12.0)
Neutro Abs: 4.7 10*3/uL (ref 1.4–7.7)
Neutrophils Relative %: 65.1 % (ref 43.0–77.0)
Platelets: 173 10*3/uL (ref 150.0–400.0)
RBC: 4.49 Mil/uL (ref 3.87–5.11)
RDW: 12.5 % (ref 11.5–15.5)
WBC: 7.3 10*3/uL (ref 4.0–10.5)

## 2024-08-21 LAB — LIPID PANEL
Cholesterol: 144 mg/dL (ref 28–200)
HDL: 44.4 mg/dL
LDL Cholesterol: 43 mg/dL (ref 10–99)
NonHDL: 99.13
Total CHOL/HDL Ratio: 3
Triglycerides: 283 mg/dL — ABNORMAL HIGH (ref 10.0–149.0)
VLDL: 56.6 mg/dL — ABNORMAL HIGH (ref 0.0–40.0)

## 2024-08-21 LAB — COMPREHENSIVE METABOLIC PANEL WITH GFR
ALT: 15 U/L (ref 3–35)
AST: 23 U/L (ref 5–37)
Albumin: 4.5 g/dL (ref 3.5–5.2)
Alkaline Phosphatase: 68 U/L (ref 39–117)
BUN: 19 mg/dL (ref 6–23)
CO2: 31 meq/L (ref 19–32)
Calcium: 10 mg/dL (ref 8.4–10.5)
Chloride: 96 meq/L (ref 96–112)
Creatinine, Ser: 0.86 mg/dL (ref 0.40–1.20)
GFR: 61.45 mL/min
Glucose, Bld: 85 mg/dL (ref 70–99)
Potassium: 3.9 meq/L (ref 3.5–5.1)
Sodium: 137 meq/L (ref 135–145)
Total Bilirubin: 1.1 mg/dL (ref 0.2–1.2)
Total Protein: 7.2 g/dL (ref 6.0–8.3)

## 2024-08-21 LAB — TSH: TSH: 0.3 u[IU]/mL — ABNORMAL LOW (ref 0.35–5.50)

## 2024-08-21 LAB — MICROALBUMIN / CREATININE URINE RATIO
Creatinine,U: 94.2 mg/dL
Microalb Creat Ratio: 99.3 mg/g — ABNORMAL HIGH (ref 0.0–30.0)
Microalb, Ur: 9.3 mg/dL — ABNORMAL HIGH (ref 0.7–1.9)

## 2024-08-21 LAB — HEMOGLOBIN A1C: Hgb A1c MFr Bld: 6.3 % (ref 4.6–6.5)

## 2024-08-21 NOTE — Patient Instructions (Addendum)
"   ongoing issues - will check thyroid  and advised to take miralax half capful daily in full glass of water for 2 weeks (can continue long term if needed) to see if we can help that- if not doing better by time of her gastroenterology appointment she can ask their opinion  Please call [phone] to schedule a visit with [facility] Dr. Hollace - please tell the office you were directly referred by Dr. Katrinka [facility] contact Please call to schedule visit and/or procedure IF you do not hear within a week Address: [address] [place], [place], [place] [address] Phone: [phone]   Schedule your bone density test at check out desk.  - located [address] across the street from [place] - in the basement - you DO NEED an appointment for the bone density tests.    Please stop by lab before you go If you have mychart- we will send your results within 3 business days of us  receiving them.  If you do not have mychart- we will call you about results within 5 business days of us  receiving them.  *please also note that you will see labs on mychart as soon as they post. I will later go in and write notes on them- will say notes from Dr. Katrinka   Recommended follow up: Return in about 6 months (around 02/18/2025) for followup or sooner if needed.Schedule b4 you leave. "

## 2024-08-24 ENCOUNTER — Other Ambulatory Visit: Payer: Self-pay | Admitting: Family Medicine

## 2024-08-24 MED ORDER — LEVOTHYROXINE SODIUM 88 MCG PO TABS: 88.0000 ug | ORAL_TABLET | Freq: Every day | ORAL | 3 refills | Status: AC

## 2024-09-01 ENCOUNTER — Ambulatory Visit: Admitting: Gastroenterology

## 2024-09-01 ENCOUNTER — Ambulatory Visit: Admitting: Radiology

## 2024-09-08 ENCOUNTER — Other Ambulatory Visit

## 2024-09-15 ENCOUNTER — Ambulatory Visit: Admitting: Radiation Oncology

## 2024-09-15 ENCOUNTER — Ambulatory Visit: Admitting: Radiology

## 2025-02-18 ENCOUNTER — Ambulatory Visit: Admitting: Family Medicine

## 2025-06-30 ENCOUNTER — Ambulatory Visit
